# Patient Record
Sex: Female | Born: 1970 | Race: White | Hispanic: No | Smoking: Never smoker
Health system: Southern US, Community
[De-identification: ages and names within clinical notes are randomized; demographics above are authoritative.]

## PROBLEM LIST (undated history)

## (undated) DIAGNOSIS — G43909 Migraine, unspecified, not intractable, without status migrainosus: Secondary | ICD-10-CM

## (undated) DIAGNOSIS — L309 Dermatitis, unspecified: Secondary | ICD-10-CM

## (undated) DIAGNOSIS — T148XXA Other injury of unspecified body region, initial encounter: Secondary | ICD-10-CM

## (undated) DIAGNOSIS — F32A Depression, unspecified: Secondary | ICD-10-CM

## (undated) DIAGNOSIS — R112 Nausea with vomiting, unspecified: Secondary | ICD-10-CM

## (undated) DIAGNOSIS — F419 Anxiety disorder, unspecified: Secondary | ICD-10-CM

## (undated) DIAGNOSIS — L509 Urticaria, unspecified: Secondary | ICD-10-CM

## (undated) DIAGNOSIS — E039 Hypothyroidism, unspecified: Secondary | ICD-10-CM

## (undated) DIAGNOSIS — T7840XA Allergy, unspecified, initial encounter: Secondary | ICD-10-CM

## (undated) DIAGNOSIS — Z9889 Other specified postprocedural states: Secondary | ICD-10-CM

## (undated) DIAGNOSIS — F329 Major depressive disorder, single episode, unspecified: Secondary | ICD-10-CM

## (undated) DIAGNOSIS — T8859XA Other complications of anesthesia, initial encounter: Secondary | ICD-10-CM

## (undated) DIAGNOSIS — M6530 Trigger finger, unspecified finger: Secondary | ICD-10-CM

## (undated) DIAGNOSIS — N3010 Interstitial cystitis (chronic) without hematuria: Secondary | ICD-10-CM

## (undated) DIAGNOSIS — G709 Myoneural disorder, unspecified: Secondary | ICD-10-CM

## (undated) DIAGNOSIS — M7710 Lateral epicondylitis, unspecified elbow: Secondary | ICD-10-CM

## (undated) DIAGNOSIS — E785 Hyperlipidemia, unspecified: Secondary | ICD-10-CM

## (undated) DIAGNOSIS — N920 Excessive and frequent menstruation with regular cycle: Secondary | ICD-10-CM

## (undated) DIAGNOSIS — K219 Gastro-esophageal reflux disease without esophagitis: Secondary | ICD-10-CM

## (undated) DIAGNOSIS — E079 Disorder of thyroid, unspecified: Secondary | ICD-10-CM

## (undated) HISTORY — DX: Myoneural disorder, unspecified: G70.9

## (undated) HISTORY — PX: DIAGNOSTIC LAPAROSCOPY: SUR761

## (undated) HISTORY — DX: Major depressive disorder, single episode, unspecified: F32.9

## (undated) HISTORY — DX: Lateral epicondylitis, unspecified elbow: M77.10

## (undated) HISTORY — PX: TRIGGER FINGER RELEASE: SHX641

## (undated) HISTORY — DX: Other injury of unspecified body region, initial encounter: T14.8XXA

## (undated) HISTORY — DX: Hyperlipidemia, unspecified: E78.5

## (undated) HISTORY — DX: Disorder of thyroid, unspecified: E07.9

## (undated) HISTORY — DX: Dermatitis, unspecified: L30.9

## (undated) HISTORY — DX: Anxiety disorder, unspecified: F41.9

## (undated) HISTORY — DX: Allergy, unspecified, initial encounter: T78.40XA

## (undated) HISTORY — PX: ELBOW SURGERY: SHX618

## (undated) HISTORY — DX: Depression, unspecified: F32.A

## (undated) HISTORY — DX: Urticaria, unspecified: L50.9

---

## 1998-05-18 ENCOUNTER — Ambulatory Visit (HOSPITAL_BASED_OUTPATIENT_CLINIC_OR_DEPARTMENT_OTHER): Admission: RE | Admit: 1998-05-18 | Discharge: 1998-05-18 | Payer: Self-pay | Admitting: Plastic Surgery

## 1998-07-23 ENCOUNTER — Other Ambulatory Visit: Admission: RE | Admit: 1998-07-23 | Discharge: 1998-07-23 | Payer: Self-pay | Admitting: Obstetrics & Gynecology

## 1999-02-25 ENCOUNTER — Inpatient Hospital Stay (HOSPITAL_COMMUNITY): Admission: AD | Admit: 1999-02-25 | Discharge: 1999-02-25 | Payer: Self-pay | Admitting: Obstetrics and Gynecology

## 1999-03-08 ENCOUNTER — Inpatient Hospital Stay (HOSPITAL_COMMUNITY): Admission: AD | Admit: 1999-03-08 | Discharge: 1999-03-11 | Payer: Self-pay | Admitting: Obstetrics and Gynecology

## 1999-04-06 ENCOUNTER — Other Ambulatory Visit: Admission: RE | Admit: 1999-04-06 | Discharge: 1999-04-06 | Payer: Self-pay | Admitting: Obstetrics and Gynecology

## 2000-05-15 ENCOUNTER — Other Ambulatory Visit: Admission: RE | Admit: 2000-05-15 | Discharge: 2000-05-15 | Payer: Self-pay | Admitting: Obstetrics and Gynecology

## 2001-02-07 HISTORY — PX: CHOLECYSTECTOMY: SHX55

## 2001-05-16 ENCOUNTER — Other Ambulatory Visit: Admission: RE | Admit: 2001-05-16 | Discharge: 2001-05-16 | Payer: Self-pay | Admitting: Obstetrics and Gynecology

## 2001-05-17 ENCOUNTER — Encounter: Payer: Self-pay | Admitting: Internal Medicine

## 2001-05-17 ENCOUNTER — Encounter: Admission: RE | Admit: 2001-05-17 | Discharge: 2001-05-17 | Payer: Self-pay | Admitting: Internal Medicine

## 2001-06-19 ENCOUNTER — Encounter: Admission: RE | Admit: 2001-06-19 | Discharge: 2001-06-19 | Payer: Self-pay | Admitting: Internal Medicine

## 2001-06-19 ENCOUNTER — Encounter: Payer: Self-pay | Admitting: Internal Medicine

## 2001-07-09 ENCOUNTER — Encounter (INDEPENDENT_AMBULATORY_CARE_PROVIDER_SITE_OTHER): Payer: Self-pay | Admitting: *Deleted

## 2001-07-09 ENCOUNTER — Encounter: Payer: Self-pay | Admitting: General Surgery

## 2001-07-09 ENCOUNTER — Ambulatory Visit (HOSPITAL_COMMUNITY): Admission: RE | Admit: 2001-07-09 | Discharge: 2001-07-10 | Payer: Self-pay | Admitting: General Surgery

## 2002-03-30 ENCOUNTER — Inpatient Hospital Stay (HOSPITAL_COMMUNITY): Admission: AD | Admit: 2002-03-30 | Discharge: 2002-03-30 | Payer: Self-pay | Admitting: Obstetrics and Gynecology

## 2002-04-27 ENCOUNTER — Inpatient Hospital Stay (HOSPITAL_COMMUNITY): Admission: AD | Admit: 2002-04-27 | Discharge: 2002-04-27 | Payer: Self-pay | Admitting: Obstetrics and Gynecology

## 2002-05-23 ENCOUNTER — Other Ambulatory Visit: Admission: RE | Admit: 2002-05-23 | Discharge: 2002-05-23 | Payer: Self-pay | Admitting: Obstetrics and Gynecology

## 2002-07-09 ENCOUNTER — Ambulatory Visit (HOSPITAL_COMMUNITY): Admission: RE | Admit: 2002-07-09 | Discharge: 2002-07-09 | Payer: Self-pay | Admitting: Obstetrics and Gynecology

## 2003-01-17 ENCOUNTER — Other Ambulatory Visit: Admission: RE | Admit: 2003-01-17 | Discharge: 2003-01-17 | Payer: Self-pay | Admitting: Obstetrics and Gynecology

## 2003-08-08 ENCOUNTER — Inpatient Hospital Stay (HOSPITAL_COMMUNITY): Admission: AD | Admit: 2003-08-08 | Discharge: 2003-08-11 | Payer: Self-pay | Admitting: Obstetrics and Gynecology

## 2003-08-12 ENCOUNTER — Encounter: Admission: RE | Admit: 2003-08-12 | Discharge: 2003-09-11 | Payer: Self-pay | Admitting: Obstetrics and Gynecology

## 2008-02-08 HISTORY — PX: OTHER SURGICAL HISTORY: SHX169

## 2009-04-06 ENCOUNTER — Encounter: Admission: RE | Admit: 2009-04-06 | Discharge: 2009-04-06 | Payer: Self-pay | Admitting: Obstetrics and Gynecology

## 2010-03-29 ENCOUNTER — Other Ambulatory Visit: Payer: Self-pay | Admitting: Obstetrics and Gynecology

## 2010-03-29 DIAGNOSIS — Z1231 Encounter for screening mammogram for malignant neoplasm of breast: Secondary | ICD-10-CM

## 2010-04-06 ENCOUNTER — Ambulatory Visit
Admission: RE | Admit: 2010-04-06 | Discharge: 2010-04-06 | Disposition: A | Discharge status: Home / Self Care | Payer: BC Managed Care – PPO | Source: Ambulatory Visit | Attending: Obstetrics and Gynecology | Admitting: Obstetrics and Gynecology

## 2010-04-06 ENCOUNTER — Other Ambulatory Visit: Payer: Self-pay | Admitting: Obstetrics and Gynecology

## 2010-04-06 DIAGNOSIS — Z1231 Encounter for screening mammogram for malignant neoplasm of breast: Secondary | ICD-10-CM
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
## 2010-06-25 NOTE — Discharge Summary (Signed)
NAME:  [name], LEWING                 ACCOUNT NO.:  [id number]   MEDICAL RECORD NO.:  [id number]                   PATIENT TYPE:  INP   LOCATION:  9134                                 FACILITY:  [facility]   PHYSICIAN:  Lenoard Aden, M.D.             DATE OF BIRTH:  1971/01/08   DATE OF ADMISSION:  08/08/2003  DATE OF DISCHARGE:  08/11/2003                                 DISCHARGE SUMMARY   REASON FOR ADMISSION:  Repeat C section.   DISCHARGE DIAGNOSIS:  Repeat C section.   PERTINENT FINDINGS:  The patient underwent uncomplicated repeat C section on  May 15, 2003.  Postoperative course uncomplicated.  CBC within normal  limits.  Routine postoperative care given.  Discharged to home postop day  #3.   CONDITION ON DISCHARGE:  Good.   COMPLICATIONS:  None.   FOLLOWUP:  Follow up in the office in 4-6 weeks.   MEDICATIONS:  1. Tylox  2. Prenatal vitamins.                                               Lenoard Aden, M.D.    RJT/MEDQ  D:  08/24/2003  T:  08/25/2003  Job:  [id number]

## 2010-06-25 NOTE — Op Note (Signed)
[place]. [facility]  Patient:    [name], [name] Visit Number: [id number] MRN: [id number]          Service Type: DSU Location: [id number] Attending Physician:  Delsa Bern Dictated by:   Lorne Skeens. Hoxworth, M.D. Proc. Date: 03/15/01 Admit Date:  07/09/2001                             Operative Report  PREOPERATIVE DIAGNOSES: 1. Cholecystitis. 2. Cholelithiasis.  POSTOPERATIVE DIAGNOSES: 1. Cholecystitis. 2. Cholelithiasis.  PROCEDURE:  Laparoscopic cholecystectomy with intraoperative cholangiogram.  SURGEON:  Sharlet Salina T. Hoxworth, M.D.  ASSISTANT:  Anselm Pancoast. Zachery Dakins, M.D.  ANESTHESIA:  General.  BRIEF HISTORY:  The patient is a 40 year old white female who recently was found to have mildly elevated LFTs.  On questioning she has been having episodic upper abdominal pain after meals and ultrasound has revealed small gallstones.  Laparoscopic cholecystectomy with a cholangiogram has been recommended and accepted.  The nature of the procedure, is indications, the risks of bleeding, infection, bile duct injury with possible need for a further procedure, were discussed and understood.  She is now brought to the operating room for this procedure.  DESCRIPTION OF PROCEDURE:  The patient was brought in the operating room and placed in the supine position on the operating table, and general endotracheal anesthesia was induced.  She was given preoperative antibiotics.  PAS were in place.  The abdomen was sterilely prepped and draped.  Local anesthesia was used to infiltrate the trocar sites.  A 1 cm incision was made in the umbilicus and dissection carried down to the midline fascia, which was sharply incised for 1 cm and the peritoneum entered under direct vision.  Through a mattress suture of 0 Vicryl, the Hasson trocar was placed and pneumoperitoneum established.  Under direct vision a 10 mm trocar was placed in the  subxiphoid area and two 5 mm trocars in the right subcostal margin.  The gallbladder was visualized and was not acutely inflamed.  There were omental adhesions up onto the fundus that were taken down sharply and with cautery, and the infundibulum was exposed and retracted inferolaterally.  Fibrofatty tissue was stripped off the neck of the gallbladder toward the porta hepatis.  The distal gallbladder was thoroughly dissected as was Calots triangle.  The cystic artery was identified coursing up on the gallbladder wall.  The cystic duct was identified and dissected 360 degrees at the gallbladder junction.  The anatomy was cleared, and the cystic duct was doubly clipped proximally, clipped distally, and the cystic duct was clipped at the gallbladder junction. Operative cholangiogram was then obtained through the cystic duct.  Initial films showed a couple of filling defects that appeared to be likely air bubbles.  The x-ray was repeated, and this showed no filling defects with normal-size common bile duct and intrahepatic ducts and free flow into the duodenum.  Following this, the cholangiocath was removed and the cystic duct was doubly clipped proximally and the cystic duct and artery divided.  The gallbladder was then dissected free from its bed using hook cautery and removed through the umbilicus.  The right upper quadrant was thoroughly irrigated and suctioned until clear and complete hemostasis assured.  The trocars were removed under direct vision and all CO2 evacuated from the peritoneal cavity.  The mattress suture was secured at the umbilicus.  Skin incisions were closed wit interrupted subcuticular 4-0 Monocryl and  Steri-Strips.  Sponge and instrument counts were correct.  A dry sterile dressing applied and the patient taken to recovery in good condition. Dictated by:   Lorne Skeens. Hoxworth, M.D. Attending Physician:  Delsa Bern DD:  07/09/01 TD:  07/10/01 Job:  [id number]

## 2010-06-25 NOTE — Op Note (Signed)
NAME:  [name], [name]                 ACCOUNT NO.:  [id number]   MEDICAL RECORD NO.:  [id number]                   PATIENT TYPE:  INP   LOCATION:  9134                                 [facility]ACILITY:  [facility]   PHYSICIAN:  Lenoard Aden, M.D.             DATE OF BIRTH:  02-04-1971   DATE OF PROCEDURE:  08/08/2003  DATE OF DISCHARGE:                                 OPERATIVE REPORT   PREOPERATIVE DIAGNOSES:  1. Thirty-nine week intrauterine pregnancy.  2. Previous cesarean section, for repeat.   POSTOPERATIVE DIAGNOSES:  1. Thirty-nine week intrauterine pregnancy.  2. Previous cesarean section, for repeat.   PROCEDURE:  Repeat low segment transverse cesarean section.   SURGEON:  Lenoard Aden, M.D.   ASSISTANT:  Chester Holstein. Earlene Plater, M.D.   ANESTHESIA:  Spinal by Pamalee Leyden.   ESTIMATED BLOOD LOSS:  800 mL.   COMPLICATIONS:  None.   DRAINS:  Foley.   Counts correct.  The patient to recovery in good condition.   FINDINGS:  A full-term living female, occiput anterior position, Apgars of 8  and 9.  Pediatrics in attendance.  Placenta posterior, delivered from a  manual intact location.  Normal tubes and ovaries noted.   BRIEF OPERATIVE NOTE:  After being apprised of the risks of anesthesia,  infection, bleeding, injury to abdominal organs and need for repair, the  patient is brought to the operating room, where she is administered a spinal  anesthetic without complications, prepped and draped in the usual sterile  fashion, and a Foley catheter placed.  After achieving adequate anesthesia,  dilute Marcaine solution placed in the area of previous Pfannenstiel skin  incision, made with a scalpel, carried down to the fascia, which is nicked  in the midline and opened transversely using Mayo scissors.  Rectus muscles  dissected sharply in the midline, peritoneum entered sharply, bladder blade  placed.  The visceral peritoneum scored sharply off the lower uterine  segment using  Metzenbaum scissors.  Kerr hysterotomy incision made.  Amniotomy, clear fluid.  Atraumatic delivery of a full-term living female,  handed to pediatricians in attendance.  Cord blood collected, bulb suction  performed, uterus exteriorized, curetted using a dry lap, then closed in two  layers using a 0 Monocryl suture.  Good hemostasis noted.  The bladder flap  is inspected and  found to be hemostatic.  The fascia closed using a 0 Monocryl suture, 2-0  plain suture placed in the subcutaneous tissue, old scar is removed.  Skin  closed using staples.  The patient tolerates the procedure well, transferred  to recovery in good condition.                                               Lenoard Aden, M.D.    RJT/MEDQ  D:  08/08/2003  T:  08/09/2003  Job:  [id number]
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
## 2010-06-25 NOTE — Op Note (Signed)
NAME:  [name], [name]                 ACCOUNT NO.:  [id number]   MEDICAL RECORD NO.:  [id number]                   PATIENT TYPE:  AMB   LOCATION:  SDC                                  [facility]ILITY:  [facility]   PHYSICIAN:  Lenoard Aden, M.D.             DATE OF BIRTH:  September 27, 1970   DATE OF PROCEDURE:  07/09/2002  DATE OF DISCHARGE:                                 OPERATIVE REPORT   PREOPERATIVE DIAGNOSIS:  Pelvic pain, dysmenorrhea.   POSTOPERATIVE DIAGNOSIS:  Pelvic adhesions and left peritubal cyst.   PROCEDURE:  Diagnostic laparoscopy, lysis of adhesions, chromopertubation.   SURGEON:  Lenoard Aden, M.D.   ANESTHESIA:  General.   ESTIMATED BLOOD LOSS:  Less than 20 cc.   COMPLICATIONS:  None.   DRAINS:  None.   COUNTS:  Correct.   The patient to recovery in good condition.   FINDINGS:  Normal retroflexed uterus, omental adhesions to anterior  abdominal wall along the area of previous Cesarean section site.  Left  normal appearing left tube with small left peritubal cyst.  Normal right  tube.  Bilateral normal ovaries.  Left corpus luteum.   DESCRIPTION OF PROCEDURE:  After being apprised of the risks of anesthesia,  infection, bleeding, inability to cure pain, the patient was brought to the  operating room where she was administered general anesthetic without  complication, prepped and draped in usual sterile fashion, catheterized  until the bladder is emptied.  After achieving adequate general anesthesia,  a single-tooth tenaculum and acorn cannula placed per vagina and set up for  chromopertubation with methylene blue dye.  At this time infraumbilical  incision made with the scalpel after placement of a dilute Marcaine  solution.  Veress needle placed, opening pressure -1 noted.  CO2, 3.5  liters, insufflated without difficulty.  Trocar placement done with  atraumatic trocar entry noted.  Visualization reveals absent gallbladder and  normal appendix,  retroflexed uterus, omental adhesions to anterior abdominal  wall, normal tubes and ovaries, normal posterior cul-de-sac.  After  visualization is complete and pictures are taken 5 mm trocar site made in  the midline of previous Cesarean section incision.  Incision then made with  a scalpel under dilute Marcaine solution and 5 mm trocar placed.  Atraumatic  grasper placed.  Anterior abdominal wall adhesions are lysed using monopolar  scissors after identifying that there is no bowel involved in the anterior  abdominal wall adhesions.  At this time good visualization is noticed.  Manipulation of the uterus, tubes and ovaries reveals normal anatomy.  A  small left peritubal cyst is noted.  Methylene blue dye is infiltrated  through the tubes and found to be free and copious spillage from bilateral  normal appearing fimbria.  At this posterior and anterior cul-de-sac are  inspected and found to be without evidence of endometriosis.  There are  however some string-like adhesions to the posterior wall of the uterus in  the cul-de-sac.  However, there is no evidence of vesicular or typical  appearing endometriosis in any of these areas.  At this time irrigation was  accomplished and CO2 released.  All instruments removed under direct  visualization.  Incisions are closed using 0 Vicryl, 4-0 Vicryl, and  Dermabond.  The patient tolerated the procedure well and is transferred to  recovery in good condition.                                               Lenoard Aden, M.D.    RJT/MEDQ  D:  07/09/2002  T:  07/09/2002  Job:  [id number]

## 2010-06-25 NOTE — Procedures (Signed)
[place]. [facility]  Patient:    [name]                 MRN: [id number] Proc. Date: 02/25/99 Adm. Date:  [id number] Attending:  Lenoard Aden CC:         [facility]                           Procedure Report  CHIEF COMPLAINT:  Breech presentation at 38-weeks for external cephalic version. The patient is a 40 year old white female, G 1, P 0, now at 38-weeks with new onset breech/transverse lie who presents for external cephalic version.  She is apprised of the risks of fetal bradycardia, possible need for emergent C-section.  She is apprised of the 50% success rate.  She is also apprised of a 10% reversion rate  after successful version.  After these precautions, she acknowledges consents were signed, and desired to proceed.  After the patient was placed on the monitor, reactive NST was noted with Dr. Cherly Hensen as my first assistant.  Three attempts ere made at external cephalic version with a forward roll under ultrasound guidance  after breech had been confirmed.  Forward roll was tried from the maternal left to right, and then after that, backwards roll was attempted from right to left. All attempts at external cephalic version maneuvers were unsuccessful.  During the procedure, fetal heart tones were noted to be in the 120-140 beats per minute range.  Subsequent to the procedure, reactive NST was noted.  The patient tolerated the procedure well and was discharged to home. DD:  02/25/99 TD:  02/25/99 Job: [id number]

## 2011-03-28 ENCOUNTER — Other Ambulatory Visit: Payer: Self-pay | Admitting: Obstetrics and Gynecology

## 2011-03-28 DIAGNOSIS — Z1231 Encounter for screening mammogram for malignant neoplasm of breast: Secondary | ICD-10-CM

## 2011-04-22 ENCOUNTER — Ambulatory Visit
Admission: RE | Admit: 2011-04-22 | Discharge: 2011-04-22 | Disposition: A | Discharge status: Home / Self Care | Payer: BC Managed Care – PPO | Source: Ambulatory Visit | Attending: Obstetrics and Gynecology | Admitting: Obstetrics and Gynecology

## 2011-04-22 DIAGNOSIS — Z1231 Encounter for screening mammogram for malignant neoplasm of breast: Secondary | ICD-10-CM

## 2012-04-10 ENCOUNTER — Other Ambulatory Visit: Payer: Self-pay

## 2012-04-10 DIAGNOSIS — Z1231 Encounter for screening mammogram for malignant neoplasm of breast: Secondary | ICD-10-CM

## 2012-05-14 ENCOUNTER — Ambulatory Visit
Admission: RE | Admit: 2012-05-14 | Discharge: 2012-05-14 | Disposition: A | Discharge status: Home / Self Care | Payer: BC Managed Care – PPO | Source: Ambulatory Visit

## 2012-05-14 DIAGNOSIS — Z1231 Encounter for screening mammogram for malignant neoplasm of breast: Secondary | ICD-10-CM

## 2013-05-13 ENCOUNTER — Other Ambulatory Visit: Payer: Self-pay

## 2013-05-13 DIAGNOSIS — Z1231 Encounter for screening mammogram for malignant neoplasm of breast: Secondary | ICD-10-CM

## 2013-05-16 ENCOUNTER — Ambulatory Visit
Admission: RE | Admit: 2013-05-16 | Discharge: 2013-05-16 | Disposition: A | Discharge status: Home / Self Care | Payer: BC Managed Care – PPO | Source: Ambulatory Visit

## 2013-05-16 DIAGNOSIS — Z1231 Encounter for screening mammogram for malignant neoplasm of breast: Secondary | ICD-10-CM

## 2015-11-17 ENCOUNTER — Ambulatory Visit (INDEPENDENT_AMBULATORY_CARE_PROVIDER_SITE_OTHER): Payer: BC Managed Care – PPO | Admitting: Family Medicine

## 2015-11-17 VITALS — BP 134/76 | HR 91 | Temp 99.0°F | Resp 18 | Ht 66.5 in | Wt 169.0 lb

## 2015-11-17 DIAGNOSIS — E039 Hypothyroidism, unspecified: Secondary | ICD-10-CM

## 2015-11-17 DIAGNOSIS — B372 Candidiasis of skin and nail: Secondary | ICD-10-CM

## 2015-11-17 DIAGNOSIS — Z23 Encounter for immunization: Secondary | ICD-10-CM

## 2015-11-17 LAB — POCT CBC
GRANULOCYTE PERCENT: 76.9 % (ref 37–80)
HEMATOCRIT: 39.6 % (ref 37.7–47.9)
HEMOGLOBIN: 14.1 g/dL (ref 12.2–16.2)
LYMPH, POC: 2.1 (ref 0.6–3.4)
MCH, POC: 31.7 pg — AB (ref 27–31.2)
MCHC: 35.6 g/dL — AB (ref 31.8–35.4)
MCV: 89.1 fL (ref 80–97)
MID (cbc): 0.4 (ref 0–0.9)
MPV: 7.4 fL (ref 0–99.8)
POC GRANULOCYTE: 8.1 — AB (ref 2–6.9)
POC LYMPH %: 19.7 % (ref 10–50)
POC MID %: 3.4 % (ref 0–12)
Platelet Count, POC: 237 10*3/uL (ref 142–424)
RBC: 4.44 M/uL (ref 4.04–5.48)
RDW, POC: 12.7 %
WBC: 10.5 10*3/uL — AB (ref 4.6–10.2)

## 2015-11-17 LAB — GLUCOSE, POCT (MANUAL RESULT ENTRY): POC Glucose: 64 mg/dl — AB (ref 70–99)

## 2015-11-17 LAB — COMPREHENSIVE METABOLIC PANEL
ALK PHOS: 87 U/L (ref 33–115)
ALT: 32 U/L — AB (ref 6–29)
AST: 22 U/L (ref 10–35)
Albumin: 4.4 g/dL (ref 3.6–5.1)
BILIRUBIN TOTAL: 0.4 mg/dL (ref 0.2–1.2)
BUN: 14 mg/dL (ref 7–25)
CO2: 21 mmol/L (ref 20–31)
Calcium: 9.5 mg/dL (ref 8.6–10.2)
Chloride: 105 mmol/L (ref 98–110)
Creat: 0.75 mg/dL (ref 0.50–1.10)
GLUCOSE: 78 mg/dL (ref 65–99)
Potassium: 3.6 mmol/L (ref 3.5–5.3)
Sodium: 137 mmol/L (ref 135–146)
TOTAL PROTEIN: 7.1 g/dL (ref 6.1–8.1)

## 2015-11-17 LAB — POCT SKIN KOH: Skin KOH, POC: NEGATIVE

## 2015-11-17 LAB — POCT GLYCOSYLATED HEMOGLOBIN (HGB A1C): Hemoglobin A1C: 5.3

## 2015-11-17 MED ORDER — ITRACONAZOLE 100 MG PO CAPS: 200.0000 mg | ORAL_CAPSULE | Freq: Two times a day (BID) | ORAL | 1 refills | Status: DC

## 2015-11-17 MED ORDER — LORAZEPAM 0.5 MG PO TABS: 0.5000 mg | ORAL_TABLET | Freq: Every evening | ORAL | 0 refills | Status: DC | PRN

## 2015-11-17 MED ORDER — HYDROXYZINE HCL 25 MG PO TABS: 25.0000 mg | ORAL_TABLET | Freq: Four times a day (QID) | ORAL | 0 refills | Status: DC | PRN

## 2015-11-17 MED ORDER — NYSTATIN 100000 UNIT/GM EX POWD: Freq: Four times a day (QID) | CUTANEOUS | 2 refills | Status: DC

## 2015-11-17 MED ORDER — KETOCONAZOLE 2 % EX CREA: 1.0000 "application " | TOPICAL_CREAM | Freq: Every day | CUTANEOUS | 2 refills | Status: DC

## 2015-11-17 NOTE — Progress Notes (Signed)
Subjective:  By signing my name below, I, [name], attest that this documentation has been prepared under the direction and in the presence of Andrea Cheadle, MD. Electronically Signed: Moises May, [place]. 11/17/2015 , 3:58 PM .  Patient was seen in Room 11 .   Patient ID: [name], female    DOB: 03/06/1970, 45 y.o.   MRN: [id number] Chief Complaint  Patient presents with  . Rash    x 3 months - noticed first one on Lt inner tigh, and spread. Thinks is a ringworm.   HPI [name] is a 45 y.o. female who presents to [facility] complaining of a rash that began in her left inner thigh as a small circle the size of a dime almost 4 mos prior.  She had often suffered from vaginal and perineal candidiasis prior and so started using nystatin-triamcinolone she had been prescribed prior which seemed to help but then returned and progressed to the size of baseball followed by the development of central clearing.  She tried hydrocortisone cream which also seemed to help initially but then again worsened and she has not developed a similar rash under both her breasts and her axilla.  She is trying to keep very dry with cornstarch and cleaning with diluted teat-tree oil and then mixed with coconut oil as well as lotrimin bid > 1 wk Is really miserable as gets much more pruritic and burning when she sweats. Very irritated. Was itching so much she had to take benadryl sev times/d for the last few d.  Reports she has often had vaginal candidiasis numerous times prior and often is difficult to get it to resolve. She has to take fluconazole qd3 x 3 and often use otc monistat in addition - last course of this was just 2 wks ago.  Unknown why she gets this so freq.  Does not have a PCP and would like to establish here.  She has been primarily following with just her gyn - Dr. [name].   He has been following her thyroid as well but would like to change that to here. He has tried her on klonopin which she  uses periodically to help sleep but tends to make her more groggy in the morning which is difficult considering her high intensity/energy job.    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 Drinks 2 pints of beer 4 nights/wk.  No past medical history on file. Prior to Admission medications   Not on File   Allergies  Allergen Reactions  . Sulfa Antibiotics Shortness Of Breath  . Vibramycin [Doxycycline Calcium]    Past Surgical History:  Procedure Laterality Date  . CESAREAN SECTION    . CHOLECYSTECTOMY  2003   Family History  Problem Relation Age of Onset  . Hypertension Father   . Hyperlipidemia Father   . Cancer Maternal Grandmother   . Diabetes Maternal Grandmother   . Hypertension Maternal Grandmother   . Heart disease Maternal Grandfather   . Hyperlipidemia Maternal Grandfather   . Hypertension Maternal Grandfather   . Diabetes Paternal Grandmother   . Stroke Paternal Grandmother   . Diabetes Paternal Grandfather   . Heart disease Paternal Grandfather   . Hyperlipidemia Paternal Grandfather   . Hypertension Paternal Grandfather    Social History   Social History  . Marital status: Married    Spouse name: N/A  . Number of children: N/A  . Years of  education: N/A   Social History Main Topics  . Smoking status: Never Smoker  . Smokeless tobacco: Never Used  . Alcohol use None  . Drug use: Unknown  . Sexual activity: Not Asked   Other Topics Concern  . None   Social History Narrative  . None    Review of Systems  Constitutional: Negative for activity change, appetite change, chills, diaphoresis, fever and unexpected weight change.  Gastrointestinal: Negative for abdominal distention, abdominal pain, nausea and vomiting.  Genitourinary: Positive for genital sores. Negative for dyspareunia, dysuria, pelvic pain and vaginal discharge.  Musculoskeletal: Negative for gait problem and joint swelling.  Skin:  Positive for rash. Negative for pallor and wound.  Allergic/Immunologic: Negative for environmental allergies, food allergies and immunocompromised state.  Hematological: Negative for adenopathy. Does not bruise/bleed easily.  Psychiatric/Behavioral: Positive for sleep disturbance.       Objective:   Physical Exam  Constitutional: She is oriented to person, place, and time. She appears well-developed and well-nourished. No distress.  HENT:  Head: Normocephalic and atraumatic.  Eyes: EOM are normal. Pupils are equal, round, and reactive to light.  Neck: Neck supple.  Cardiovascular: Normal rate.   Pulmonary/Chest: Effort normal. No respiratory distress.  Musculoskeletal: Normal range of motion.  Lymphadenopathy:    She has no axillary adenopathy.       Right: No inguinal adenopathy present.       Left: No inguinal adenopathy present.  Neurological: She is alert and oriented to person, place, and time.  Skin: Skin is warm and dry. Rash noted. Rash is maculopapular. There is erythema.  In crease between labia majora and and upper thigh, bilateral axilla, and under breasts pt has macular light pink erythema with few surrounding satellite lesions.  Psychiatric: She has a normal mood and affect. Her behavior is normal.  Nursing note and vitals reviewed.   BP 134/76 (BP Location: Right Arm, Patient Position: Sitting, Cuff Size: Normal)   Pulse 91   Temp 99 F (37.2 C) (Oral)   Resp 18   Ht 5' 6.5" (1.689 m)   Wt 169 lb (76.7 kg)   SpO2 97%   BMI 26.87 kg/m     Results for orders placed or performed in visit on 11/17/15  Thyroid Panel With TSH  Result Value Ref Range   T4, Total  4.5 - 12.0 ug/dL   T3 Uptake  22 - 35 %   Free Thyroxine Index  1.4 - 3.8   TSH 1.00 mIU/L  Comprehensive metabolic panel  Result Value Ref Range   Sodium  135 - 146 mmol/L   Potassium  3.5 - 5.3 mmol/L   Chloride  98 - 110 mmol/L   CO2  20 - 31 mmol/L   Glucose, Bld  65 - 99 mg/dL   BUN  7 - 25  mg/dL   Creat  0.50 - 1.10 mg/dL   Total Bilirubin  0.2 - 1.2 mg/dL   Alkaline Phosphatase  33 - 115 U/L   AST  10 - 35 U/L   ALT  6 - 29 U/L   Total Protein  6.1 - 8.1 g/dL   Albumin  3.6 - 5.1 g/dL   Calcium  8.6 - 10.2 mg/dL  POCT glycosylated hemoglobin (Hb A1C)  Result Value Ref Range   Hemoglobin A1C 5.3   POCT glucose (manual entry)  Result Value Ref Range   POC Glucose 64 (A) 70 - 99 mg/dl  POCT CBC  Result Value Ref Range  WBC 10.5 (A) 4.6 - 10.2 K/uL   Lymph, poc 2.1 0.6 - 3.4   POC LYMPH PERCENT 19.7 10 - 50 %L   MID (cbc) 0.4 0 - 0.9   POC MID % 3.4 0 - 12 %M   POC Granulocyte 8.1 (A) 2 - 6.9   Granulocyte percent 76.9 37 - 80 %G   RBC 4.44 4.04 - 5.48 M/uL   Hemoglobin 14.1 12.2 - 16.2 g/dL   HCT, POC 39.6 37.7 - 47.9 %   MCV 89.1 80 - 97 fL   MCH, POC 31.7 (A) 27 - 31.2 pg   MCHC 35.6 (A) 31.8 - 35.4 g/dL   RDW, POC 12.7 %   Platelet Count, POC 237 142 - 424 K/uL   MPV 7.4 0 - 99.8 fL  POCT Skin KOH  Result Value Ref Range   Skin KOH, POC Negative   did NOT obtain a good scraping - mainly just topical ointment  Assessment & Plan:   1. Candidiasis of skin - has failed topical treatment with nystatin-triamcinolone, hydrocortisone, lotrimen, and oral fluconazole x 3 doses in 1 wk. Pt concerned she may be developing some resistance the the fluconazole due to her freq of use as she is often having difficulty still getting rid for monilial flairs so will try itraconazole instead - 200 mg bid x 2-6 wks - cont until fully resolved.  Also use topicals and try to keep dry.  As pt does drink beer sev x/wk and needs to cut down while on oral antifungal, she would like a sleep med to help her wind down at night - try lorazepam rather than klonopin since tht is last to long.  RTC for recheck and lfts if any abd pain, nausea. Try hydroxyzine prn during day.  2. Hypothyroidism, unspecified type - refill levothyroxine x 6 mos prn over next 6 mos  3. Encounter for immunization      Orders Placed This Encounter  Procedures  . Flu Vaccine QUAD 36+ mos IM  . Thyroid Panel With TSH  . Comprehensive metabolic panel  . POCT glycosylated hemoglobin (Hb A1C)  . POCT glucose (manual entry)  . POCT CBC  . POCT Skin KOH    Meds ordered this encounter  Medications  . levothyroxine (SYNTHROID, LEVOTHROID) 100 MCG tablet    Sig: Take 100 mcg by mouth daily before breakfast.  . Meth-Hyo-M Bl-Na Phos-Ph Sal (URIBEL) 118 MG CAPS    Sig: Take by mouth.  . itraconazole (SPORANOX) 100 MG capsule    Sig: Take 2 capsules (200 mg total) by mouth 2 (two) times daily.    Dispense:  120 capsule    Refill:  1  . LORazepam (ATIVAN) 0.5 MG tablet    Sig: Take 1-2 tablets (0.5-1 mg total) by mouth at bedtime as needed for anxiety.    Dispense:  60 tablet    Refill:  0  . hydrOXYzine (ATARAX/VISTARIL) 25 MG tablet    Sig: Take 1 tablet (25 mg total) by mouth every 6 (six) hours as needed for anxiety or itching.    Dispense:  60 tablet    Refill:  0  . nystatin (MYCOSTATIN/NYSTOP) powder    Sig: Apply topically 4 (four) times daily.    Dispense:  60 g    Refill:  2  . ketoconazole (NIZORAL) 2 % cream    Sig: Apply 1 application topically daily.    Dispense:  60 g    Refill:  2  [name], M.D.  [facility] [address] [place], [place] [address] [phone] phone [phone] fax  11/17/15 10:48 PM

## 2015-11-17 NOTE — Patient Instructions (Addendum)
     IF you received an x-ray today, you will receive an invoice from [facility]. Please contact [facility] at [phone] with questions or concerns regarding your invoice.   IF you received labwork today, you will receive an invoice from [employer]. Please contact [facility] at [phone] with questions or concerns regarding your invoice.   Our billing staff will not be able to assist you with questions regarding bills from these companies.  You will be contacted with the lab results as soon as they are available. The fastest way to get your results is to activate your My Chart account. Instructions are located on the last page of this paperwork. If you have not heard from [place] regarding the results in 2 weeks, please contact this office.     Cutaneous Candidiasis Cutaneous candidiasis is a condition in which there is an overgrowth of yeast (candida) on the skin. Yeast normally live on the skin, but in small enough numbers not to cause any symptoms. In certain cases, increased growth of the yeast may cause an actual yeast infection. This kind of infection usually occurs in areas of the skin that are constantly warm and moist, such as the armpits or the groin. Yeast is the most common cause of diaper rash in babies and in people who cannot control their bowel movements (incontinence). CAUSES  The fungus that most often causes cutaneous candidiasis is Candida albicans. Conditions that can increase the risk of getting a yeast infection of the skin include:  Obesity.  Pregnancy.  Diabetes.  Taking antibiotic medicine.  Taking birth control pills.  Taking steroid medicines.  Thyroid disease.  An iron or zinc deficiency.  Problems with the immune system. SYMPTOMS   Red, swollen area of the skin.  Bumps on the skin.  Itchiness. DIAGNOSIS  The diagnosis of cutaneous candidiasis is usually based on its appearance. Light scrapings of  the skin may also be taken and viewed under a microscope to identify the presence of yeast. TREATMENT  Antifungal creams may be applied to the infected skin. In severe cases, oral medicines may be needed.  HOME CARE INSTRUCTIONS   Keep your skin clean and dry.  Maintain a healthy weight.  If you have diabetes, keep your blood sugar under control. SEEK IMMEDIATE MEDICAL CARE IF:  Your rash continues to spread despite treatment.  You have a fever, chills, or abdominal pain.   This information is not intended to replace advice given to you by your health care provider. Make sure you discuss any questions you have with your health care provider.   Document Released: 10/12/2010 Document Revised: 04/18/2011 Document Reviewed: 07/28/2014 Elsevier Interactive Patient Education [employer].

## 2015-11-18 LAB — THYROID PANEL WITH TSH
FREE THYROXINE INDEX: 2.9 (ref 1.4–3.8)
T3 Uptake: 31 % (ref 22–35)
T4, Total: 9.4 ug/dL (ref 4.5–12.0)
TSH: 1 m[IU]/L

## 2015-11-25 ENCOUNTER — Encounter: Payer: Self-pay | Admitting: Family Medicine

## 2015-11-25 MED ORDER — LEVOTHYROXINE SODIUM 100 MCG PO TABS: 100.0000 ug | ORAL_TABLET | Freq: Every day | ORAL | 3 refills | Status: DC

## 2015-11-25 NOTE — Addendum Note (Signed)
Addended by: [name] on: 11/25/2015 03:15 PM   Modules accepted: Orders

## 2015-12-10 ENCOUNTER — Ambulatory Visit (INDEPENDENT_AMBULATORY_CARE_PROVIDER_SITE_OTHER): Payer: BC Managed Care – PPO | Admitting: Physician Assistant

## 2015-12-10 ENCOUNTER — Ambulatory Visit (INDEPENDENT_AMBULATORY_CARE_PROVIDER_SITE_OTHER): Payer: BC Managed Care – PPO

## 2015-12-10 VITALS — BP 118/76 | HR 92 | Temp 98.7°F | Resp 16 | Ht 67.0 in | Wt 169.0 lb

## 2015-12-10 DIAGNOSIS — M79672 Pain in left foot: Secondary | ICD-10-CM

## 2015-12-10 DIAGNOSIS — S8990XA Unspecified injury of unspecified lower leg, initial encounter: Secondary | ICD-10-CM

## 2015-12-10 DIAGNOSIS — S8011XA Contusion of right lower leg, initial encounter: Secondary | ICD-10-CM | POA: Diagnosis not present

## 2015-12-10 DIAGNOSIS — T148XXA Other injury of unspecified body region, initial encounter: Secondary | ICD-10-CM | POA: Diagnosis not present

## 2015-12-10 MED ORDER — MUPIROCIN 2 % EX OINT: 1.0000 "application " | TOPICAL_OINTMENT | Freq: Two times a day (BID) | CUTANEOUS | 1 refills | Status: DC

## 2015-12-10 NOTE — Progress Notes (Signed)
[facility] [address] [place] [address] [phone]  Date:  12/10/2015   Name:  [name]   DOB:  September 17, 1970   MRN:  [id number]  PCP:  [name], MD    History of Present Illness:  [name] is a 45 y.o. female patient who presents to [facility] for chief complaint of right leg pain, and left foot pain. Right leg pain started 4 days ago. She was walking when she stepped into a hole that was about 1-2 feet deep. There was a metal pole in there. She hit her shin along the metal pole. It was excruciatingly painful. She had an operation which she treated with Neosporin. It was difficult to walk due to the pain but it has improved a lot over the last few days and pain. Husband had placed clear adhesive over the abrasion on Monday, 4 days ago. 12 hours later she complained of it continuously stinging. He removed the adhesive to find blisters along the sides of her leg where the adhesive bandaging was. These have not gone away. The leg continues to standing. She is able to ambulate well. She is a our Pharmacist, hospital. 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 Foot pain started more than a week ago. She does not know the cause of injury. There was no trauma however she does wonder if she may have hit her foot on a chair at one point. The pain is at her medial side of the top of her foot. She has noticed that it's mildly swollen and red. She generally wears dance code issues and says that they're difficult to put on at this time secondary to the pain. She is able to ambulate fine on it as well.  She has had a history of rash following any kind of adhesive bandaging or Band-Aid.   There are no active problems to display for this patient.   Past Medical History:  Diagnosis Date  . Allergy   . Anxiety   . Depression   . Thyroid disease     Past Surgical History:  Procedure Laterality Date  . CESAREAN SECTION    . CHOLECYSTECTOMY  2003    Social History  Substance Use  Topics  . Smoking status: Never Smoker  . Smokeless tobacco: Never Used  . Alcohol use No    Family History  Problem Relation Age of Onset  . Hypertension Father   . Hyperlipidemia Father   . Cancer Maternal Grandmother   . Diabetes Maternal Grandmother   . Hypertension Maternal Grandmother   . Heart disease Maternal Grandfather   . Hyperlipidemia Maternal Grandfather   . Hypertension Maternal Grandfather   . Diabetes Paternal Grandmother   . Stroke Paternal Grandmother   . Diabetes Paternal Grandfather   . Heart disease Paternal Grandfather   . Hyperlipidemia Paternal Grandfather   . Hypertension Paternal Grandfather     Allergies  Allergen Reactions  . Sulfa Antibiotics Shortness Of Breath  . Vibramycin [Doxycycline Calcium]     Medication list has been reviewed and updated.  Current Outpatient Prescriptions on File Prior to Visit  Medication Sig Dispense Refill  . levothyroxine (SYNTHROID, LEVOTHROID) 100 MCG tablet Take 1 tablet (100 mcg total) by mouth daily. 90 tablet 3  . LORazepam (ATIVAN) 0.5 MG tablet Take 1-2 tablets (0.5-1 mg total) by mouth at bedtime as needed for anxiety. 60 tablet 0  . Meth-Hyo-M Bl-Na Phos-Ph Sal (URIBEL) 118 MG CAPS Take by  mouth.    . nystatin (MYCOSTATIN/NYSTOP) powder Apply topically 4 (four) times daily. 60 g 2   No current facility-administered medications on file prior to visit.     ROS ROS otherwise unremarkable unless listed above.  Physical Examination: BP 118/76 (BP Location: Right Arm, Patient Position: Sitting, Cuff Size: Normal)   Pulse 92   Temp 98.7 F (37.1 C) (Oral)   Resp 16   Ht 5\' 7"  (1.702 m)   Wt 169 lb (76.7 kg)   SpO2 98%   BMI 26.47 kg/m  Ideal Body Weight: Weight in (lb) to have BMI = 25: 159.3  Physical Exam  Constitutional: She is oriented to person, place, and time. She appears well-developed and well-nourished. No distress.  HENT:  Head: Normocephalic and atraumatic.  Right Ear: External ear  normal.  Left Ear: External ear normal.  Eyes: Conjunctivae and EOM are normal. Pupils are equal, round, and reactive to light.  Cardiovascular: Normal rate.   Pulmonary/Chest: Effort normal. No respiratory distress.  Neurological: She is alert and oriented to person, place, and time.  Skin: She is not diaphoretic.  Right leg with anterior abrasion about 3 cm supero-inferior.  There are 3 large bolus blisters without surrounding erythema that are nontender at the lateral anterior medial anterior and a third one just below or at the medial anterior. These are consistent with where the bandaging was placed.  Psychiatric: She has a normal mood and affect. Her behavior is normal.   Dg Tibia/fibula Right  Result Date: 12/10/2015 CLINICAL DATA:  [place] and injured leg.  Contusions and abrasions. EXAM: RIGHT TIBIA AND FIBULA - 2 VIEW COMPARISON:  None. FINDINGS: The knee and ankle joints are maintained. No acute fracture of the tibia or fibula is identified. Calcaneal heel spur is noted incidentally along with os trigonum. Soft tissue abnormalities likely related to contusions or lacerations. IMPRESSION: Normal radiographs of the right tibia/ fibula. Electronically Signed   By: Marijo Sanes M.D.   On: 12/10/2015 16:58   Dg Foot Complete Left  Result Date: 12/10/2015 CLINICAL DATA:  Pain following injury 1 week prior EXAM: LEFT FOOT - COMPLETE 3+ VIEW COMPARISON:  None. FINDINGS: Frontal, oblique, and lateral views were obtained. There is no fracture or dislocation. Joint spaces appear normal. No erosive change. There are spurs arising from the posterior and inferior calcaneus. There is a rather prominent os naviculare, an anatomic variant. IMPRESSION: Calcaneal spurs. No fracture or dislocation. No evident arthropathy. Electronically Signed   By: Lowella Grip III M.D.   On: 12/10/2015 17:00     Assessment and Plan: [name] is a 45 y.o. female who is here today for chief complaint of  blisters, abrasion, of right leg, and left foot pain. Right leg normal without acute fracture or abnormality. Advised to use the mupirocin ointment along the abrasion. This was wrapped with a first aid bandage without adhesive. Advised ice the leg.  The blisters will remain there and advised her to use compression over the blisters that will likely reabsorb in the body or burst. If they do pursed have advised that she can apply a little bit of the mupirocin ointment. I've also advised that she let the wound care out during the nighttime last the skin become macerated. Left foot pain secondary to possible contusion or strain. I've advised that she to ice this area.  Injury of lower leg, unspecified laterality, initial encounter - Plan: DG Foot Complete Left, DG Tibia/Fibula Right  Contusion of right  lower extremity, initial encounter  Left foot pain - Plan: DG Foot Complete Left  Blister  Ivar Drape, PA-C [facility] 11/3/20178:58 AM

## 2015-12-10 NOTE — Patient Instructions (Addendum)
  I would like you to keep the mupirocin ointment along the leg.  You can keep the blisters without covering.  However if they open, place mupirocin ointment and cover by day and let breathe by night.  Wash the wound twice per day with soap and water.  These blisters should get smaller within 1 week.     IF you received an x-ray today, you will receive an invoice from [facility]. Please contact [facility] at [phone] with questions or concerns regarding your invoice.   IF you received labwork today, you will receive an invoice from [employer]. Please contact [facility] at [phone] with questions or concerns regarding your invoice.   Our billing staff will not be able to assist you with questions regarding bills from these companies.  You will be contacted with the lab results as soon as they are available. The fastest way to get your results is to activate your My Chart account. Instructions are located on the last page of this paperwork. If you have not heard from [place] regarding the results in 2 weeks, please contact this office.

## 2016-04-19 ENCOUNTER — Other Ambulatory Visit: Payer: Self-pay | Admitting: Obstetrics and Gynecology

## 2016-04-19 DIAGNOSIS — N6311 Unspecified lump in the right breast, upper outer quadrant: Secondary | ICD-10-CM

## 2016-04-19 DIAGNOSIS — N644 Mastodynia: Secondary | ICD-10-CM

## 2016-04-21 ENCOUNTER — Ambulatory Visit
Admission: RE | Admit: 2016-04-21 | Discharge: 2016-04-21 | Disposition: A | Discharge status: Home / Self Care | Payer: BC Managed Care – PPO | Source: Ambulatory Visit | Attending: Obstetrics and Gynecology | Admitting: Obstetrics and Gynecology

## 2016-04-21 ENCOUNTER — Other Ambulatory Visit: Payer: Self-pay | Admitting: Obstetrics and Gynecology

## 2016-04-21 DIAGNOSIS — N644 Mastodynia: Secondary | ICD-10-CM

## 2016-04-21 DIAGNOSIS — N6311 Unspecified lump in the right breast, upper outer quadrant: Secondary | ICD-10-CM

## 2016-08-22 ENCOUNTER — Ambulatory Visit (INDEPENDENT_AMBULATORY_CARE_PROVIDER_SITE_OTHER): Payer: BC Managed Care – PPO | Admitting: Physician Assistant

## 2016-08-22 ENCOUNTER — Telehealth: Payer: Self-pay | Admitting: Physician Assistant

## 2016-08-22 VITALS — BP 122/75 | HR 77 | Temp 98.6°F | Resp 18 | Ht 67.0 in | Wt 164.4 lb

## 2016-08-22 DIAGNOSIS — L299 Pruritus, unspecified: Secondary | ICD-10-CM | POA: Diagnosis not present

## 2016-08-22 DIAGNOSIS — L03116 Cellulitis of left lower limb: Secondary | ICD-10-CM | POA: Diagnosis not present

## 2016-08-22 DIAGNOSIS — T63424A Toxic effect of venom of ants, undetermined, initial encounter: Secondary | ICD-10-CM | POA: Diagnosis not present

## 2016-08-22 MED ORDER — DOXEPIN HCL 5 % EX CREA: 1.0000 "application " | TOPICAL_CREAM | Freq: Four times a day (QID) | CUTANEOUS | 0 refills | Status: DC

## 2016-08-22 MED ORDER — CLINDAMYCIN HCL 300 MG PO CAPS: 300.0000 mg | ORAL_CAPSULE | Freq: Four times a day (QID) | ORAL | 0 refills | Status: AC

## 2016-08-22 MED ORDER — PREDNISONE 20 MG PO TABS: ORAL_TABLET | ORAL | 0 refills | Status: DC

## 2016-08-22 NOTE — Telephone Encounter (Signed)
Please advise 

## 2016-08-22 NOTE — Progress Notes (Signed)
[name]  MRN: [id number] DOB: 1970/04/01  PCP: Shawnee Knapp, MD  Subjective:  Pt is a 46 year old female who presents to clinic for left foot pain x 3 days. Pain is 6/10 scale. C/o red, painful bite marks. She initially thought it was a bee sting, took benadryl. Two more spots appeared around 3 am that morning. They are red and painful. She used an ice pack yesterday and stayed off of it, C/o dizzines and ankle pain. Ice is the only relief. Ibuprofen/benadryl don't help. Her toes feel funny. Endorses red streaks on the top of her foot when she gets hot.  She saw fire ants on her property around the time she initially felt the pain.  Denies shob, wheezing, difficulty breathing, tongue swelling, abdominal pain, n/v.   Review of Systems  Constitutional: Negative for diaphoresis and fatigue.  Respiratory: Negative for shortness of breath and wheezing.   Cardiovascular: Negative for palpitations.  Gastrointestinal: Negative for abdominal pain, diarrhea, nausea and vomiting.  Musculoskeletal: Positive for arthralgias, gait problem and joint swelling.  Skin: Positive for wound. Negative for rash.  Neurological: Positive for dizziness. Negative for weakness.    There are no active problems to display for this patient.   Current Outpatient Prescriptions on File Prior to Visit  Medication Sig Dispense Refill  . hydrOXYzine (ATARAX/VISTARIL) 25 MG tablet Take 25 mg by mouth 3 (three) times daily as needed.    Marland Kitchen levothyroxine (SYNTHROID, LEVOTHROID) 100 MCG tablet Take 1 tablet (100 mcg total) by mouth daily. 90 tablet 3  . LORazepam (ATIVAN) 0.5 MG tablet Take 1-2 tablets (0.5-1 mg total) by mouth at bedtime as needed for anxiety. 60 tablet 0  . Meth-Hyo-M Bl-Na Phos-Ph Sal (URIBEL) 118 MG CAPS Take by mouth.    Marland Kitchen ketoconazole (NIZORAL) 2 % cream Apply 1 application topically daily.    . mupirocin ointment (BACTROBAN) 2 % Apply 1 application topically 2 (two) times daily. (Patient not  taking: Reported on 08/22/2016) 22 g 1  . nystatin (MYCOSTATIN/NYSTOP) powder Apply topically 4 (four) times daily. (Patient not taking: Reported on 08/22/2016) 60 g 2   No current facility-administered medications on file prior to visit.     Allergies  Allergen Reactions  . Sulfa Antibiotics Shortness Of Breath  . Vibramycin [Doxycycline Calcium]      Objective:  BP 122/75 (BP Location: Right Arm, Patient Position: Sitting, Cuff Size: Normal)   Pulse 77   Temp 98.6 F (37 C) (Oral)   Resp 18   Ht 5\' 7"  (1.702 m)   Wt 164 lb 6.4 oz (74.6 kg)   LMP 08/18/2016   SpO2 97%   BMI 25.75 kg/m   Physical Exam  Constitutional: She is oriented to person, place, and time and well-developed, well-nourished, and in no distress. No distress.  Cardiovascular: Normal rate, regular rhythm, normal heart sounds, intact distal pulses and normal pulses.   Musculoskeletal:  Multiple small punctate lesions dorsal foot. No streaking, warmth. Mild drainage, erythema and swelling. Wound culture taken.   Neurological: She is alert and oriented to person, place, and time. GCS score is 15.  Skin: Skin is warm and dry.  Psychiatric: Mood, memory, affect and judgment normal.  Vitals reviewed.   Assessment and Plan :  1. Cellulitis of left lower extremity 2. Fire ant bite, undetermined intent, initial encounter 3. Itching - WOUND CULTURE - predniSONE (DELTASONE) 20 MG tablet; Take 3 PO QAM x2days, 2 PO QAM x2days, 1 PO QAM x2days  Dispense: 12 tablet; Refill: 0 - clindamycin (CLEOCIN) 300 MG capsule; Take 1 capsule (300 mg total) by mouth 4 (four) times daily.  Dispense: 28 capsule; Refill: 0 - Doxepin HCl 5 % CREA; Apply 1 application topically 4 (four) times daily. at least 3- to 4-hour interval between applications; not recommended for use >8 days.  Dispense: 45 g; Refill: 0 - Suspect bacterial infection secondary to possible bite. Plan to treat. Advised ice and elevation.  RTC if no improvement in 7-10  days.    Mercer Pod, PA-C  [facility] 08/22/2016 12:16 PM

## 2016-08-22 NOTE — Patient Instructions (Addendum)
Start taking Zantac and Zyrtec together for the next 5 days. Start taking your other medications as prescribed. Con't icing as needed for pain and swelling. Elevate your foot to help with swelling. Keep the area clean (with soap and water) and dry. Come back if you are not better in 5-7 days.   Thank you for coming in today. I hope you feel we met your needs.  Feel free to call [facility] if you have any questions or further requests.  Please consider signing up for MyChart if you do not already have it, as this is a great way to communicate with me.  Best,  Whitney McVey, PA-C  IF you received an x-ray today, you will receive an invoice from [facility]. Please contact [facility] at [phone] with questions or concerns regarding your invoice.   IF you received labwork today, you will receive an invoice from [facility]. Please contact [facility] at [phone] with questions or concerns regarding your invoice.   Our billing staff will not be able to assist you with questions regarding bills from these companies.  You will be contacted with the lab results as soon as they are available. The fastest way to get your results is to activate your My Chart account. Instructions are located on the last page of this paperwork. If you have not heard from [place] regarding the results in 2 weeks, please contact this office.

## 2016-08-22 NOTE — Telephone Encounter (Signed)
GREG WHO IS THE PHARMACIST AT [facility] CALLED TO LET [place] KNOW THAT THE DOXEPIN HCI 5% CREAM WILL NEED A PRIOR AUTHORIZATION. HE FAXED OVER THE FORM TO THE [phone] FAX NUMBER. IF SHE DOES NOT WANT TO GO THAT ROUTE SHE CAN JUST CALL IN SOMETHING ELSE. BUT WITH OUT INSURANCE IT IS ABOUT $700.00. BEST PHONE [phone] [facility] [address]) [place]

## 2016-08-22 NOTE — Telephone Encounter (Signed)
Pt called checking the status of req.

## 2016-08-23 NOTE — Telephone Encounter (Signed)
I was under the impression this issue was resolved?

## 2016-08-23 NOTE — Telephone Encounter (Signed)
Whitney do you want to call something else in? Pls Advise

## 2016-08-25 LAB — WOUND CULTURE: Organism ID, Bacteria: NONE SEEN

## 2016-08-25 NOTE — Telephone Encounter (Signed)
[facility] pharmacist.   Thispatient's issue was resolve-there were no notes that this was done in the chart or in med list.  Per pharmacist - Triamcinolone Cream was called in because insurance will cover.  Pt has picked up med.

## 2016-11-10 ENCOUNTER — Encounter: Payer: Self-pay | Admitting: Family Medicine

## 2016-11-10 ENCOUNTER — Ambulatory Visit (INDEPENDENT_AMBULATORY_CARE_PROVIDER_SITE_OTHER): Payer: BC Managed Care – PPO | Admitting: Family Medicine

## 2016-11-10 ENCOUNTER — Telehealth: Payer: Self-pay | Admitting: Family Medicine

## 2016-11-10 VITALS — BP 116/62 | HR 91 | Temp 98.8°F | Resp 17 | Ht 67.0 in | Wt 164.6 lb

## 2016-11-10 DIAGNOSIS — J301 Allergic rhinitis due to pollen: Secondary | ICD-10-CM

## 2016-11-10 DIAGNOSIS — R0981 Nasal congestion: Secondary | ICD-10-CM

## 2016-11-10 DIAGNOSIS — J069 Acute upper respiratory infection, unspecified: Secondary | ICD-10-CM

## 2016-11-10 MED ORDER — FLUTICASONE PROPIONATE 50 MCG/ACT NA SUSP: 2.0000 | Freq: Every day | NASAL | 6 refills | Status: DC

## 2016-11-10 NOTE — Telephone Encounter (Signed)
Dr. Brigitte Pulse patient has scheduled an appointment

## 2016-11-10 NOTE — Patient Instructions (Addendum)
1. For sinus congestion use your flonase nasal spray 2. Rinse your nasal sinuses with Milta Deiters MD sinus rinse (there is a video on youtube to show you how) 3. Take a salt water gargle as needed (instructions below) 4. Use lozenges as needed for sore throat    IF you received an x-ray today, you will receive an invoice from [facility]. Please contact [facility] at [phone] with questions or concerns regarding your invoice.   IF you received labwork today, you will receive an invoice from [facility]. Please contact [facility] at [phone] with questions or concerns regarding your invoice.   Our billing staff will not be able to assist you with questions regarding bills from these companies.  You will be contacted with the lab results as soon as they are available. The fastest way to get your results is to activate your My Chart account. Instructions are located on the last page of this paperwork. If you have not heard from [place] regarding the results in 2 weeks, please contact this office.    Allergic Rhinitis Allergic rhinitis is when the mucous membranes in the nose respond to allergens. Allergens are particles in the air that cause your body to have an allergic reaction. This causes you to release allergic antibodies. Through a chain of events, these eventually cause you to release histamine into the blood stream. Although meant to protect the body, it is this release of histamine that causes your discomfort, such as frequent sneezing, congestion, and an itchy, runny nose. What are the causes? Seasonal allergic rhinitis (hay fever) is caused by pollen allergens that may come from grasses, trees, and weeds. Year-round allergic rhinitis (perennial allergic rhinitis) is caused by allergens such as house dust mites, pet dander, and mold spores. What are the signs or symptoms?  Nasal stuffiness (congestion).  Itchy, runny nose with sneezing and tearing of the eyes. How is this  diagnosed? Your health care provider can help you determine the allergen or allergens that trigger your symptoms. If you and your health care provider are unable to determine the allergen, skin or blood testing may be used. Your health care provider will diagnose your condition after taking your health history and performing a physical exam. Your health care provider may assess you for other related conditions, such as asthma, pink eye, or an ear infection. How is this treated? Allergic rhinitis does not have a cure, but it can be controlled by:  Medicines that block allergy symptoms. These may include allergy shots, nasal sprays, and oral antihistamines.  Avoiding the allergen.  Hay fever may often be treated with antihistamines in pill or nasal spray forms. Antihistamines block the effects of histamine. There are over-the-counter medicines that may help with nasal congestion and swelling around the eyes. Check with your health care provider before taking or giving this medicine. If avoiding the allergen or the medicine prescribed do not work, there are many new medicines your health care provider can prescribe. Stronger medicine may be used if initial measures are ineffective. Desensitizing injections can be used if medicine and avoidance does not work. Desensitization is when a patient is given ongoing shots until the body becomes less sensitive to the allergen. Make sure you follow up with your health care provider if problems continue. Follow these instructions at home: It is not possible to completely avoid allergens, but you can reduce your symptoms by taking steps to limit your exposure to them. It helps to know exactly what you are allergic to  so that you can avoid your specific triggers. Contact a health care provider if:  You have a fever.  You develop a cough that does not stop easily (persistent).  You have shortness of breath.  You start wheezing.  Symptoms interfere with normal  daily activities. This information is not intended to replace advice given to you by your health care provider. Make sure you discuss any questions you have with your health care provider. Document Released: 10/19/2000 Document Revised: 09/25/2015 Document Reviewed: 10/01/2012 Elsevier Interactive Patient Education  2017 Elsevier Inc.  Sore Throat A sore throat is pain, burning, irritation, or scratchiness in the throat. When you have a sore throat, you may feel pain or tenderness in your throat when you swallow or talk. Many things can cause a sore throat, including:  An infection.  Seasonal allergies.  Dryness in the air.  Irritants, such as smoke or pollution.  Gastroesophageal reflux disease (GERD).  A tumor.  A sore throat is often the first sign of another sickness. It may happen with other symptoms, such as coughing, sneezing, fever, and swollen neck glands. Most sore throats go away without medical treatment. Follow these instructions at home:  Take over-the-counter medicines only as told by your health care provider.  Drink enough fluids to keep your urine clear or pale yellow.  Rest as needed.  To help with pain, try: ? Sipping warm liquids, such as broth, herbal tea, or warm water. ? Eating or drinking cold or frozen liquids, such as frozen ice pops. ? Gargling with a salt-water mixture 3-4 times a day or as needed. To make a salt-water mixture, completely dissolve -1 tsp of salt in 1 cup of warm water. ? Sucking on hard candy or throat lozenges. ? Putting a cool-mist humidifier in your bedroom at night to moisten the air. ? Sitting in the bathroom with the door closed for 5-10 minutes while you run hot water in the shower.  Do not use any tobacco products, such as cigarettes, chewing tobacco, and e-cigarettes. If you need help quitting, ask your health care provider. Contact a health care provider if:  You have a fever for more than 2-3 days.  You have  symptoms that last (are persistent) for more than 2-3 days.  Your throat does not get better within 7 days.  You have a fever and your symptoms suddenly get worse. Get help right away if:  You have difficulty breathing.  You cannot swallow fluids, soft foods, or your saliva.  You have increased swelling in your throat or neck.  You have persistent nausea and vomiting. This information is not intended to replace advice given to you by your health care provider. Make sure you discuss any questions you have with your health care provider. Document Released: 03/03/2004 Document Revised: 09/20/2015 Document Reviewed: 11/14/2014 Elsevier Interactive Patient Education  [employer].

## 2016-11-10 NOTE — Progress Notes (Signed)
Chief Complaint  Patient presents with  . Sore Throat    onset: Monday, very sore, taking sudafed and aleve last taken at 12 noon  . URI    friday, fatigued, sneezing, bodyaches, chest hurts-burning, back pain  . Sinusitis    face hurts all around eyes  . Cough    no drainage, but discoloration to drainage in nose    HPI   Pt reports that she has hurting in her neck area and upper chest She states that she came in because of the chest burning with shortness of breath. She states that she does not cough but  alleve last at 12 noon for body aches.  She has been on sudafed at noon with sneezing. She has a runny nose with slight discoloration of the nasal sinuses.  She also has fatigue, with sneezing, sore throat. She reports that onset was Friday 12/04/16 with sneezing that she thought was due to allergies so she took zyrtec.   Past Medical History:  Diagnosis Date  . Allergy   . Anxiety   . Depression   . Thyroid disease     Current Outpatient Prescriptions  Medication Sig Dispense Refill  . escitalopram (LEXAPRO) 5 MG tablet Take 5 mg by mouth daily.    Marland Kitchen ketoconazole (NIZORAL) 2 % cream Apply 1 application topically daily.    Marland Kitchen levothyroxine (SYNTHROID, LEVOTHROID) 100 MCG tablet Take 1 tablet (100 mcg total) by mouth daily. 90 tablet 3  . Meth-Hyo-M Bl-Na Phos-Ph Sal (URIBEL) 118 MG CAPS Take by mouth.    . nystatin (MYCOSTATIN/NYSTOP) powder Apply topically 4 (four) times daily. 60 g 2  . Doxepin HCl 5 % CREA Apply 1 application topically 4 (four) times daily. at least 3- to 4-hour interval between applications; not recommended for use >8 days. (Patient not taking: Reported on 11/10/2016) 45 g 0  . fluticasone (FLONASE) 50 MCG/ACT nasal spray Place 2 sprays into both nostrils daily. 16 g 6  . hydrOXYzine (ATARAX/VISTARIL) 25 MG tablet Take 25 mg by mouth 3 (three) times daily as needed.    Marland Kitchen LORazepam (ATIVAN) 0.5 MG tablet Take 1-2 tablets (0.5-1 mg total) by mouth at  bedtime as needed for anxiety. 60 tablet 0  . mupirocin ointment (BACTROBAN) 2 % Apply 1 application topically 2 (two) times daily. (Patient not taking: Reported on 08/22/2016) 22 g 1   No current facility-administered medications for this visit.     Allergies:  Allergies  Allergen Reactions  . Sulfa Antibiotics Shortness Of Breath  . Vibramycin [Doxycycline Calcium]     Past Surgical History:  Procedure Laterality Date  . CESAREAN SECTION    . CHOLECYSTECTOMY  2003    Social History   Social History  . Marital status: Married    Spouse name: N/A  . Number of children: N/A  . Years of education: N/A   Social History Main Topics  . Smoking status: Never Smoker  . Smokeless tobacco: Never Used  . Alcohol use No  . Drug use: No  . Sexual activity: Not Asked   Other Topics Concern  . None   Social History Narrative  . None    ROS See hpi  Objective: Vitals:   11/10/16 1545  BP: 116/62  Pulse: 91  Resp: 17  Temp: 98.8 F (37.1 C)  TempSrc: Oral  SpO2: 95%  Weight: 164 lb 9.6 oz (74.7 kg)  Height: 5\' 7"  (1.702 m)    Physical Exam General: alert, oriented, in NAD Head:  normocephalic, atraumatic, no sinus tenderness Eyes: EOM intact, no scleral icterus or conjunctival injection Ears: TM clear bilaterally Nose: mucosa nonerythematous, nonedematous Throat: no pharyngeal exudate or erythema Lymph: no posterior auricular, submental or cervical lymph adenopathy Heart: normal rate, normal sinus rhythm, no murmurs Lungs: clear to auscultation bilaterally, no wheezing   Assessment and [place] was seen today for sore throat, uri, sinusitis and cough.  Diagnoses and all orders for this visit:  Non-seasonal allergic rhinitis due to pollen Sinus congestion Acute URI  Other orders -     fluticasone (FLONASE) 50 MCG/ACT nasal spray; Place 2 sprays into both nostrils daily.  Advised supportive care Pt to continue antihistamine No antibiotics  indicated   [place]

## 2016-11-11 MED ORDER — LORAZEPAM 0.5 MG PO TABS: 0.5000 mg | ORAL_TABLET | Freq: Every evening | ORAL | 0 refills | Status: DC | PRN

## 2016-11-11 NOTE — Telephone Encounter (Signed)
Called in refill to pharm

## 2016-11-18 ENCOUNTER — Encounter: Payer: Self-pay | Admitting: Family Medicine

## 2016-11-18 ENCOUNTER — Ambulatory Visit (INDEPENDENT_AMBULATORY_CARE_PROVIDER_SITE_OTHER): Payer: BC Managed Care – PPO | Admitting: Family Medicine

## 2016-11-18 VITALS — BP 122/72 | HR 84 | Temp 98.5°F | Resp 16 | Ht 66.93 in | Wt 162.0 lb

## 2016-11-18 DIAGNOSIS — Z23 Encounter for immunization: Secondary | ICD-10-CM

## 2016-11-18 DIAGNOSIS — R7401 Elevation of levels of liver transaminase levels: Secondary | ICD-10-CM

## 2016-11-18 DIAGNOSIS — R197 Diarrhea, unspecified: Secondary | ICD-10-CM | POA: Diagnosis not present

## 2016-11-18 DIAGNOSIS — E039 Hypothyroidism, unspecified: Secondary | ICD-10-CM | POA: Diagnosis not present

## 2016-11-18 DIAGNOSIS — L299 Pruritus, unspecified: Secondary | ICD-10-CM

## 2016-11-18 DIAGNOSIS — R740 Nonspecific elevation of levels of transaminase and lactic acid dehydrogenase [LDH]: Secondary | ICD-10-CM

## 2016-11-18 MED ORDER — LORAZEPAM 0.5 MG PO TABS: 0.5000 mg | ORAL_TABLET | Freq: Every evening | ORAL | 1 refills | Status: DC | PRN

## 2016-11-18 MED ORDER — HYDROXYZINE HCL 25 MG PO TABS: 25.0000 mg | ORAL_TABLET | Freq: Three times a day (TID) | ORAL | 2 refills | Status: DC | PRN

## 2016-11-18 MED ORDER — CLINDAMYCIN PHOSPHATE 1 % EX LOTN: TOPICAL_LOTION | Freq: Two times a day (BID) | CUTANEOUS | 5 refills | Status: DC

## 2016-11-18 MED ORDER — CETIRIZINE HCL 10 MG PO TABS: 10.0000 mg | ORAL_TABLET | Freq: Every day | ORAL | 3 refills | Status: DC

## 2016-11-18 NOTE — Progress Notes (Addendum)
Subjective:  By signing my name below, I, [name], attest that this documentation has been prepared under the direction and in the presence of [name], MD. Electronically Signed: [name], [place]. 11/18/2016 , 4:32 PM .  Patient was seen in Room 3 .   Patient ID: [name], female    DOB: 01-Mar-1970, 46 y.o.   MRN: [id number] Chief Complaint  Patient presents with  . Hypothyroidism    follow-up  . Depression  . Anxiety   HPI [name] is a 46 y.o. female who presents to Primary Care at [facility] for follow up. She mentions lots of viruses going around her work [employer]). She works as an Metallurgist.   Hypothyroidism She has had thyroid checked recently by OBGYN, Dr. [name].   Constipation She saw GI, Dr. Collene Mares, regarding constipation. She basically gave up beer, and was on a Paleo diet during the summer. She's lost weight, but unable to fully give up carbs now that school has started. She also gave up soda. Her constipation has greatly improved.   Rash She also reports history of itchy rash over her shoulder; believes if it's due to weather change. She is fine during the summer, but when it starts becoming colder, rash returns.   Yeast infection She also notes having yeast infection and vaginal candidiasis; has been using powder and cream for relief.   Immunizations She received her flu shot today.   Depression screening Depression screen [facility] 2/9 11/18/2016 11/10/2016 08/22/2016 12/10/2015 11/17/2015  Decreased Interest 0 0 0 0 0  Down, Depressed, Hopeless 0 0 0 0 0  PHQ - 2 Score 0 0 0 0 0   She notes her mood greatly improved with Prozac 5mg . She has klonopin but uses it very sparingly, and only uses it when really need it.   Anxiety She had a Mud[employer] who caused her a lot of stress and anxiety over the last semester. Students started hating her class, causing mood to be negative. This has resolved since the student teacher  leaving.   Past Medical History:  Diagnosis Date  . Allergy   . Anxiety   . Depression   . Thyroid disease    Prior to Admission medications   Medication Sig Start Date End Date Taking? Authorizing Provider  escitalopram (LEXAPRO) 5 MG tablet Take 5 mg by mouth daily.   Yes [provider]  fluticasone (FLONASE) 50 MCG/ACT nasal spray Place 2 sprays into both nostrils daily. 11/10/16  Yes Delia Chimes A, MD  hydrOXYzine (ATARAX/VISTARIL) 25 MG tablet Take 25 mg by mouth 3 (three) times daily as needed.   Yes [provider]  ketoconazole (NIZORAL) 2 % cream Apply 1 application topically daily.   Yes [provider]  levothyroxine (SYNTHROID, LEVOTHROID) 100 MCG tablet Take 1 tablet (100 mcg total) by mouth daily. 11/25/15  Yes Shawnee Knapp, MD  LORazepam (ATIVAN) 0.5 MG tablet Take 1-2 tablets (0.5-1 mg total) by mouth at bedtime as needed for anxiety. 11/11/16  Yes Shawnee Knapp, MD  Meth-Hyo-M Barnett Hatter Phos-Ph Sal (URIBEL) 118 MG CAPS Take by mouth.   Yes [provider]  nystatin (MYCOSTATIN/NYSTOP) powder Apply topically 4 (four) times daily. 11/17/15  Yes Shawnee Knapp, MD   Allergies  Allergen Reactions  . Sulfa Antibiotics Shortness Of Breath  . Vibramycin [Doxycycline Calcium]    Past Surgical History:  Procedure Laterality Date  . CESAREAN SECTION    . CHOLECYSTECTOMY  2003   Family History  Problem Relation Age of Onset  . Hypertension Father   . Hyperlipidemia Father   . Cancer Maternal Grandmother   . Diabetes Maternal Grandmother   . Hypertension Maternal Grandmother   . Heart disease Maternal Grandfather   . Hyperlipidemia Maternal Grandfather   . Hypertension Maternal Grandfather   . Diabetes Paternal Grandmother   . Stroke Paternal Grandmother   . Diabetes Paternal Grandfather   . Heart disease Paternal Grandfather   . Hyperlipidemia Paternal Grandfather   . Hypertension Paternal Grandfather    Social History   Social  History  . Marital status: Married    Spouse name: N/A  . Number of children: N/A  . Years of education: N/A   Social History Main Topics  . Smoking status: Never Smoker  . Smokeless tobacco: Never Used  . Alcohol use No  . Drug use: No  . Sexual activity: Not Asked   Other Topics Concern  . None   Social History Narrative  . None   Depression screen [facility] 2/9 11/18/2016 11/10/2016 08/22/2016 12/10/2015 11/17/2015  Decreased Interest 0 0 0 0 0  Down, Depressed, Hopeless 0 0 0 0 0  PHQ - 2 Score 0 0 0 0 0    Review of Systems  Constitutional: Negative for chills, fatigue, fever and unexpected weight change.  Respiratory: Negative for cough.   Gastrointestinal: Negative for constipation, diarrhea, nausea and vomiting.  Skin: Positive for rash. Negative for wound.  Neurological: Negative for dizziness, weakness and headaches.  Psychiatric/Behavioral: Negative for dysphoric mood. The patient is not nervous/anxious.        Objective:   Physical Exam  Constitutional: She is oriented to person, place, and time. She appears well-developed and well-nourished. No distress.  HENT:  Head: Normocephalic and atraumatic.  Eyes: Pupils are equal, round, and reactive to light. EOM are normal.  Neck: Neck supple.  Cardiovascular: Normal rate, regular rhythm, S1 normal, S2 normal and normal heart sounds.   No murmur heard. Pulmonary/Chest: Effort normal and breath sounds normal. No respiratory distress. She has no wheezes.  Abdominal: Soft. Bowel sounds are normal. She exhibits no distension. There is no tenderness.  Musculoskeletal: Normal range of motion.  Neurological: She is alert and oriented to person, place, and time.  Skin: Skin is warm and dry.  Psychiatric: She has a normal mood and affect. Her behavior is normal.  Nursing note and vitals reviewed.   BP 122/72   Pulse 84   Temp 98.5 F (36.9 C) (Oral)   Resp 16   Ht 5' 6.93" (1.7 m)   Wt 162 lb (73.5 kg)   SpO2 99%    BMI 25.43 kg/m      Assessment & Plan:   1. Acquired hypothyroidism - followed by obgyn Dr. Edwyna Ready but recheck today since doing labs - stable so sent refill of levothyroxine 100 mcg into pharm in case needed  2. Need for prophylactic vaccination and inoculation against influenza   3. Pruritic condition   4. Transaminitis - worsening on labs today - needs lipids checked, has stopped etoh since last yr  5. Diarrhea, unspecified type     Orders Placed This Encounter  Procedures  . Flu Vaccine QUAD 36+ mos IM  . Thyroid Panel With TSH  . Comprehensive metabolic panel    Order Specific Question:   Has the patient fasted?    Answer:   No  . CBC with Differential/Platelet    Meds ordered this  encounter  Medications  . LORazepam (ATIVAN) 0.5 MG tablet    Sig: Take 1-2 tablets (0.5-1 mg total) by mouth at bedtime as needed for anxiety.    Dispense:  30 tablet    Refill:  1  . cetirizine (ZYRTEC) 10 MG tablet    Sig: Take 1 tablet (10 mg total) by mouth at bedtime.    Dispense:  90 tablet    Refill:  3  . hydrOXYzine (ATARAX/VISTARIL) 25 MG tablet    Sig: Take 1 tablet (25 mg total) by mouth 3 (three) times daily as needed.    Dispense:  60 tablet    Refill:  2  . clindamycin (CLEOCIN T) 1 % lotion    Sig: Apply topically 2 (two) times daily.    Dispense:  60 mL    Refill:  5    I personally performed the services described in this documentation, which was scribed in my presence. The recorded information has been reviewed and considered, and addended by me as needed.   [name], M.D.  Primary Care at [facility] [address] [place], [place] [address] [phone] phone [phone] fax  11/19/16 5:51 AM

## 2016-11-18 NOTE — Patient Instructions (Addendum)
     IF you received an x-ray today, you will receive an invoice from [facility]. Please contact [facility] at [phone] with questions or concerns regarding your invoice.   IF you received labwork today, you will receive an invoice from [facility]. Please contact [facility] at [phone] with questions or concerns regarding your invoice.   Our billing staff will not be able to assist you with questions regarding bills from these companies.  You will be contacted with the lab results as soon as they are available. The fastest way to get your results is to activate your My Chart account. Instructions are located on the last page of this paperwork. If you have not heard from us regarding the results in 2 weeks, please contact this office.      Hypothyroidism Hypothyroidism is a disorder of the thyroid. The thyroid is a large gland that is located in the lower front of the neck. The thyroid releases hormones that control how the body works. With hypothyroidism, the thyroid does not make enough of these hormones. What are the causes? Causes of hypothyroidism may include:  Viral infections.  Pregnancy.  Your own defense system (immune system) attacking your thyroid.  Certain medicines.  Birth defects.  Past radiation treatments to your head or neck.  Past treatment with radioactive iodine.  Past surgical removal of part or all of your thyroid.  Problems with the gland that is located in the center of your brain (pituitary). What are the signs or symptoms? Signs and symptoms of hypothyroidism may include:  Feeling as though you have no energy (lethargy).  Inability to tolerate cold.  Weight gain that is not explained by a change in diet or exercise habits.  Dry skin.  Coarse hair.  Menstrual irregularity.  Slowing of thought processes.  Constipation.  Sadness or depression. How is this diagnosed? Your health care provider may diagnose  hypothyroidism with blood tests and ultrasound tests. How is this treated? Hypothyroidism is treated with medicine that replaces the hormones that your body does not make. After you begin treatment, it may take several weeks for symptoms to go away. Follow these instructions at home:  Take medicines only as directed by your health care provider.  If you start taking any new medicines, tell your health care provider.  Keep all follow-up visits as directed by your health care provider. This is important. As your condition improves, your dosage needs may change. You will need to have blood tests regularly so that your health care provider can watch your condition. Contact a health care provider if:  Your symptoms do not get better with treatment.  You are taking thyroid replacement medicine and:  You sweat excessively.  You have tremors.  You feel anxious.  You lose weight rapidly.  You cannot tolerate heat.  You have emotional swings.  You have diarrhea.  You feel weak. Get help right away if:  You develop chest pain.  You develop an irregular heartbeat.  You develop a rapid heartbeat. This information is not intended to replace advice given to you by your health care provider. Make sure you discuss any questions you have with your health care provider. Document Released: 01/24/2005 Document Revised: 07/02/2015 Document Reviewed: 06/11/2013 Elsevier Interactive Patient Education  2017 [employer] Inc.  

## 2016-11-20 LAB — CBC WITH DIFFERENTIAL/PLATELET
BASOS ABS: 0 10*3/uL (ref 0.0–0.2)
BASOS: 0 %
EOS (ABSOLUTE): 0.1 10*3/uL (ref 0.0–0.4)
Eos: 1 %
Hematocrit: 44.3 % (ref 34.0–46.6)
Hemoglobin: 14.8 g/dL (ref 11.1–15.9)
IMMATURE GRANS (ABS): 0 10*3/uL (ref 0.0–0.1)
Immature Granulocytes: 0 %
LYMPHS ABS: 2 10*3/uL (ref 0.7–3.1)
LYMPHS: 19 %
MCH: 30.1 pg (ref 26.6–33.0)
MCHC: 33.4 g/dL (ref 31.5–35.7)
MCV: 90 fL (ref 79–97)
MONOS ABS: 0.7 10*3/uL (ref 0.1–0.9)
Monocytes: 7 %
NEUTROS ABS: 7.8 10*3/uL — AB (ref 1.4–7.0)
Neutrophils: 73 %
PLATELETS: 305 10*3/uL (ref 150–379)
RBC: 4.91 x10E6/uL (ref 3.77–5.28)
RDW: 13.1 % (ref 12.3–15.4)
WBC: 10.7 10*3/uL (ref 3.4–10.8)

## 2016-11-20 LAB — COMPREHENSIVE METABOLIC PANEL
A/G RATIO: 2 (ref 1.2–2.2)
ALT: 67 IU/L — AB (ref 0–32)
AST: 41 IU/L — ABNORMAL HIGH (ref 0–40)
Albumin: 4.7 g/dL (ref 3.5–5.5)
Alkaline Phosphatase: 133 IU/L — ABNORMAL HIGH (ref 39–117)
BILIRUBIN TOTAL: 0.3 mg/dL (ref 0.0–1.2)
BUN/Creatinine Ratio: 13 (ref 9–23)
BUN: 11 mg/dL (ref 6–24)
CALCIUM: 9.5 mg/dL (ref 8.7–10.2)
CHLORIDE: 101 mmol/L (ref 96–106)
CO2: 21 mmol/L (ref 20–29)
Creatinine, Ser: 0.83 mg/dL (ref 0.57–1.00)
GFR, EST AFRICAN AMERICAN: 97 mL/min/{1.73_m2} (ref >59)
GFR, EST NON AFRICAN AMERICAN: 84 mL/min/{1.73_m2} (ref >59)
GLOBULIN, TOTAL: 2.4 g/dL (ref 1.5–4.5)
GLUCOSE: 80 mg/dL (ref 65–99)
POTASSIUM: 3.8 mmol/L (ref 3.5–5.2)
SODIUM: 139 mmol/L (ref 134–144)
TOTAL PROTEIN: 7.1 g/dL (ref 6.0–8.5)

## 2016-11-20 LAB — THYROID PANEL WITH TSH
Free Thyroxine Index: 2.1 (ref 1.2–4.9)
T3 Uptake Ratio: 24 % (ref 24–39)
T4, Total: 8.9 ug/dL (ref 4.5–12.0)
TSH: 1.13 u[IU]/mL (ref 0.450–4.500)

## 2016-11-30 ENCOUNTER — Encounter: Payer: Self-pay | Admitting: Radiology

## 2016-11-30 MED ORDER — LEVOTHYROXINE SODIUM 100 MCG PO TABS: 100.0000 ug | ORAL_TABLET | Freq: Every day | ORAL | 3 refills | Status: DC

## 2016-11-30 NOTE — Addendum Note (Signed)
Addended by: Shawnee Knapp on: 11/30/2016 03:08 AM   Modules accepted: Orders

## 2017-03-16 ENCOUNTER — Ambulatory Visit: Payer: BC Managed Care – PPO | Admitting: Urgent Care

## 2017-03-16 ENCOUNTER — Encounter: Payer: Self-pay | Admitting: Urgent Care

## 2017-03-16 VITALS — BP 117/70 | HR 81 | Temp 98.6°F | Resp 18 | Ht 66.0 in | Wt 174.6 lb

## 2017-03-16 DIAGNOSIS — R69 Illness, unspecified: Secondary | ICD-10-CM | POA: Diagnosis not present

## 2017-03-16 DIAGNOSIS — R110 Nausea: Secondary | ICD-10-CM

## 2017-03-16 DIAGNOSIS — R05 Cough: Secondary | ICD-10-CM

## 2017-03-16 DIAGNOSIS — R51 Headache: Secondary | ICD-10-CM

## 2017-03-16 DIAGNOSIS — R059 Cough, unspecified: Secondary | ICD-10-CM

## 2017-03-16 DIAGNOSIS — R52 Pain, unspecified: Secondary | ICD-10-CM | POA: Diagnosis not present

## 2017-03-16 DIAGNOSIS — J111 Influenza due to unidentified influenza virus with other respiratory manifestations: Secondary | ICD-10-CM

## 2017-03-16 DIAGNOSIS — R519 Headache, unspecified: Secondary | ICD-10-CM

## 2017-03-16 MED ORDER — PSEUDOEPHEDRINE HCL ER 120 MG PO TB12: 120.0000 mg | ORAL_TABLET | Freq: Two times a day (BID) | ORAL | 3 refills | Status: DC

## 2017-03-16 MED ORDER — BENZONATATE 100 MG PO CAPS: 100.0000 mg | ORAL_CAPSULE | Freq: Three times a day (TID) | ORAL | 0 refills | Status: DC | PRN

## 2017-03-16 MED ORDER — OSELTAMIVIR PHOSPHATE 75 MG PO CAPS: 75.0000 mg | ORAL_CAPSULE | Freq: Two times a day (BID) | ORAL | 0 refills | Status: DC

## 2017-03-16 MED ORDER — HYDROCODONE-HOMATROPINE 5-1.5 MG/5ML PO SYRP: 5.0000 mL | ORAL_SOLUTION | Freq: Every evening | ORAL | 0 refills | Status: DC | PRN

## 2017-03-16 NOTE — Progress Notes (Signed)
  MRN: [id number] DOB: September 09, 1970  Subjective:   [name] is a 47 y.o. female presenting for 2 day history of chills, body aches, headaches, facial pain, ear pain, sore throat, sneezing, has chest burning when talks for long periods of time, nausea without vomiting, decreased appetite. Uses Flonase and Allegra for allergies. She is unable to take APAP due to elevated liver enzymes. Has used Thera-flu, Advil cold/flu. Denies smoking cigarettes. Denies asthma. Denies fever, belly pain. Teaches at elementary school, has had several sick contacts.   [name] has a current medication list which includes the following prescription(s): cetirizine, clindamycin, escitalopram, fluticasone, hydroxyzine, levothyroxine, lorazepam, and uribel. Also is allergic to sulfa antibiotics and vibramycin [doxycycline calcium].  [name]  has a past medical history of Allergy, Anxiety, Depression, and Thyroid disease. Also  has a past surgical history that includes Cesarean section and Cholecystectomy (2003).  Objective:   Vitals: BP 117/70   Pulse 81   Temp 98.6 F (37 C) (Oral)   Resp 18   Ht 5\' 6"  (1.676 m)   Wt 174 lb 9.6 oz (79.2 kg)   SpO2 96%   BMI 28.18 kg/m   Physical Exam  Constitutional: She is oriented to person, place, and time. She appears well-developed and well-nourished.  HENT:  TM's intact bilaterally, no effusions or erythema. Nasal turbinates pink, moist, nasal passages patent. No sinus tenderness. Oropharynx clear, mucous membranes moist.  Eyes: Right eye exhibits no discharge. Left eye exhibits no discharge.  Neck: Normal range of motion. Neck supple.  Cardiovascular: Normal rate, regular rhythm and intact distal pulses. Exam reveals no gallop and no friction rub.  No murmur heard. Pulmonary/Chest: No respiratory distress. She has no wheezes. She has no rales.  Neurological: She is alert and oriented to person, place, and time.  Skin: Skin is warm and dry.  Psychiatric: She has a  normal mood and affect.   Assessment and Plan :   Influenza-like illness  Body aches  Cough  Generalized headaches  Nausea without vomiting  Will manage with Tamiflu given that patient works in [employer] and is being asked to cover for other teachers as well that have had illness. Supportive care offered otherwise. Return-to-clinic precautions discussed, patient verbalized understanding.   Jaynee Eagles, PA-C [facility] [phone] 03/16/2017  3:54 PM

## 2017-03-16 NOTE — Patient Instructions (Addendum)
Influenza, Adult Influenza, more commonly known as "the flu," is a viral infection that primarily affects the respiratory tract. The respiratory tract includes organs that help you breathe, such as the lungs, nose, and throat. The flu causes many common cold symptoms, as well as a high fever and body aches. The flu spreads easily from person to person (is contagious). Getting a flu shot (influenza vaccination) every year is the best way to prevent influenza. What are the causes? Influenza is caused by a virus. You can catch the virus by:  Breathing in droplets from an infected person's cough or sneeze.  Touching something that was recently contaminated with the virus and then touching your mouth, nose, or eyes.  What increases the risk? The following factors may make you more likely to get the flu:  Not cleaning your hands frequently with soap and water or alcohol-based hand sanitizer.  Having close contact with many people during cold and flu season.  Touching your mouth, eyes, or nose without washing or sanitizing your hands first.  Not drinking enough fluids or not eating a healthy diet.  Not getting enough sleep or exercise.  Being under a high amount of stress.  Not getting a yearly (annual) flu shot.  You may be at a higher risk of complications from the flu, such as a severe lung infection (pneumonia), if you:  Are over the age of 65.  Are pregnant.  Have a weakened disease-fighting system (immune system). You may have a weakened immune system if you: ? Have HIV or AIDS. ? Are undergoing chemotherapy. ? Aretaking medicines that reduce the activity of (suppress) the immune system.  Have a long-term (chronic) illness, such as heart disease, kidney disease, diabetes, or lung disease.  Have a liver disorder.  Are obese.  Have anemia.  What are the signs or symptoms? Symptoms of this condition typically last 4-10 days and may  include:  Fever.  Chills.  Headache, body aches, or muscle aches.  Sore throat.  Cough.  Runny or congested nose.  Chest discomfort and cough.  Poor appetite.  Weakness or tiredness (fatigue).  Dizziness.  Nausea or vomiting.  How is this diagnosed? This condition may be diagnosed based on your medical history and a physical exam. Your health care provider may do a nose or throat swab test to confirm the diagnosis. How is this treated? If influenza is detected early, you can be treated with antiviral medicine that can reduce the length of your illness and the severity of your symptoms. This medicine may be given by mouth (orally) or through an IV tube that is inserted in one of your veins. The goal of treatment is to relieve symptoms by taking care of yourself at home. This may include taking over-the-counter medicines, drinking plenty of fluids, and adding humidity to the air in your home. In some cases, influenza goes away on its own. Severe influenza or complications from influenza may be treated in a hospital. Follow these instructions at home:  Take over-the-counter and prescription medicines only as told by your health care provider.  Use a cool mist humidifier to add humidity to the air in your home. This can make breathing easier.  Rest as needed.  Drink enough fluid to keep your urine clear or pale yellow.  Cover your mouth and nose when you cough or sneeze.  Wash your hands with soap and water often, especially after you cough or sneeze. If soap and water are not available, use hand sanitizer.    Stay home from work or school as told by your health care provider. Unless you are visiting your health care provider, try to avoid leaving home until your fever has been gone for 24 hours without the use of medicine.  Keep all follow-up visits as told by your health care provider. This is important. How is this prevented?  Getting an annual flu shot is the best way  to avoid getting the flu. You may get the flu shot in late summer, fall, or winter. Ask your health care provider when you should get your flu shot.  Wash your hands often or use hand sanitizer often.  Avoid contact with people who are sick during cold and flu season.  Eat a healthy diet, drink plenty of fluids, get enough sleep, and exercise regularly. Contact a health care provider if:  You develop new symptoms.  You have: ? Chest pain. ? Diarrhea. ? A fever.  Your cough gets worse.  You produce more mucus.  You feel nauseous or you vomit. Get help right away if:  You develop shortness of breath or difficulty breathing.  Your skin or nails turn a bluish color.  You have severe pain or stiffness in your neck.  You develop a sudden headache or sudden pain in your face or ear.  You cannot stop vomiting. This information is not intended to replace advice given to you by your health care provider. Make sure you discuss any questions you have with your health care provider. Document Released: 01/22/2000 Document Revised: 07/02/2015 Document Reviewed: 11/18/2014 Elsevier Interactive Patient Education  2017 [employer].    IF you received an x-ray today, you will receive an invoice from [facility]. Please contact [facility] at [phone] with questions or concerns regarding your invoice.   IF you received labwork today, you will receive an invoice from [facility]. Please contact [facility] at [phone] with questions or concerns regarding your invoice.   Our billing staff will not be able to assist you with questions regarding bills from these companies.  You will be contacted with the lab results as soon as they are available. The fastest way to get your results is to activate your My Chart account. Instructions are located on the last page of this paperwork. If you have not heard from [place] regarding the results in 2 weeks, please contact this office.

## 2018-01-17 ENCOUNTER — Ambulatory Visit: Payer: Self-pay | Admitting: *Deleted

## 2018-01-17 NOTE — Telephone Encounter (Signed)
Pt's husband calling stating that the pt has had cold symptoms for the past 4 days that have progressively worsened for the past 2 days. Pt's husband states that the pt is loosing her voice with head congestion and chest pain with talking, breathing and coughing. Currently rates pain at 7. Pt states she has been taking Mucinex. Pt states that her throat is sore and her tonsils feel swollen. Earliest appt available on 01/19/18. Pt placed on wait list advised to seek treatment at Urgent Care is symptoms become worse before scheduled appt. Pt's husband verbalized understanding.   Answer Assessment - Initial Assessment Questions 1. DESCRIPTION: "Describe your voice."     Loosing voice 2. ONSET: "When did the hoarseness begin?"    Began 4 days ago, but worsened the last 2 days 3. COUGH: "Is there a cough?" If so, ask: "How bad?"     yes 4. FEVER: "Do you have a fever?" If so, ask: "What is your temperature, how was it measured, and when did it start?"     No 5. RESPIRATORY STATUS: "Describe your breathing."      No complaints of difficulty breathing 6. ALLERGIES: "Any allergy symptoms?" If so, ask: "What are they?"     No 7. IRRITANTS: "Do you smoke?" "Have you been exposed to any irritating fumes?"     No 8. CAUSE: "What do you think is causing the hoarseness?"     Has experienced cold like symptoms for the past 4 days 9. OTHER SYMPTOMS: "Do you have any other symptoms?" (e.g., sore throat, swelling, foreign body, rash)     Sore throat and tonsils feel swollen, chest pain with breathing, talking and coughing 10. PREGNANCY: "Is there any chance you are pregnant?" "When was your last menstrual period?"       Not assessed  Protocols used: HOARSENESS-A-AH

## 2018-01-19 ENCOUNTER — Ambulatory Visit: Payer: BC Managed Care – PPO | Admitting: Family Medicine

## 2018-01-19 ENCOUNTER — Other Ambulatory Visit: Payer: Self-pay

## 2018-01-19 ENCOUNTER — Encounter: Payer: Self-pay | Admitting: Family Medicine

## 2018-01-19 ENCOUNTER — Encounter

## 2018-01-19 VITALS — BP 121/80 | HR 88 | Temp 98.9°F | Ht 66.0 in | Wt 175.6 lb

## 2018-01-19 DIAGNOSIS — J0100 Acute maxillary sinusitis, unspecified: Secondary | ICD-10-CM

## 2018-01-19 MED ORDER — FLUCONAZOLE 150 MG PO TABS: 150.0000 mg | ORAL_TABLET | Freq: Once | ORAL | 0 refills | Status: AC

## 2018-01-19 MED ORDER — FLUTICASONE PROPIONATE 50 MCG/ACT NA SUSP: 2.0000 | Freq: Every day | NASAL | 6 refills | Status: DC

## 2018-01-19 MED ORDER — BENZONATATE 100 MG PO CAPS: 100.0000 mg | ORAL_CAPSULE | Freq: Three times a day (TID) | ORAL | 0 refills | Status: DC | PRN

## 2018-01-19 MED ORDER — AMOXICILLIN-POT CLAVULANATE 875-125 MG PO TABS: 1.0000 | ORAL_TABLET | Freq: Two times a day (BID) | ORAL | 0 refills | Status: DC

## 2018-01-19 NOTE — Progress Notes (Signed)
12/13/201911:39 AM  [name] Sep 28, 1970, 47 y.o. female [id number]  Chief Complaint  Patient presents with  . Sore Throat    just got over hoarsness, productive cough, and chest discomfort and burning. This has been going on for a while, yet it comes and goes. Other symptoms being diarrhea, rib pain due to the coughing.    HPI:   Patient is a 47 y.o. 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 0 No No Yes Yes  Number falls in past yr: - - - 1 2 or more  Injury with Fall? - - - Yes No     Depression screen [facility] 2/9 01/19/2018 11/18/2016 11/10/2016  Decreased Interest 0 0 0  Down, Depressed, Hopeless 0 0 0  PHQ - 2 Score 0 0 0    Allergies  Allergen Reactions  . Sulfa Antibiotics Shortness Of Breath  . Vibramycin [Doxycycline Calcium]     Prior to Admission medications   Medication Sig Start Date End Date Taking? Authorizing Provider  benzonatate (TESSALON) 100 MG capsule Take 1-2 capsules (100-200 mg total) by mouth 3 (three) times daily as needed. 03/16/17  Yes Jaynee Eagles, PA-C  cetirizine (ZYRTEC) 10 MG tablet Take 1 tablet (10 mg total) by mouth at bedtime. 11/18/16  Yes Shawnee Knapp, MD  clindamycin (CLEOCIN T) 1 % lotion Apply topically 2 (two) times daily.  11/18/16  Yes Shawnee Knapp, MD  escitalopram (LEXAPRO) 5 MG tablet Take 5 mg by mouth daily.   Yes [provider]  fluticasone (FLONASE) 50 MCG/ACT nasal spray Place 2 sprays into both nostrils daily. 11/10/16  Yes Stallings, Zoe A, MD  HYDROcodone-homatropine (HYCODAN) 5-1.5 MG/5ML syrup Take 5 mLs by mouth at bedtime as needed. 03/16/17  Yes Jaynee Eagles, PA-C  hydrOXYzine (ATARAX/VISTARIL) 25 MG tablet Take 1 tablet (25 mg total) by mouth 3 (three) times daily as needed. 11/18/16  Yes Shawnee Knapp, MD  levothyroxine (SYNTHROID, LEVOTHROID) 100 MCG tablet Take 1 tablet (100 mcg total) by mouth daily. 11/30/16  Yes Shawnee Knapp, MD  LORazepam (ATIVAN) 0.5 MG tablet Take 1-2 tablets (0.5-1 mg total) by mouth at bedtime as needed for anxiety. 11/18/16  Yes Shawnee Knapp, MD  Meth-Hyo-M Barnett Hatter Phos-Ph Sal (URIBEL) 118 MG CAPS Take by mouth.   Yes [provider]    Past Medical History:  Diagnosis Date  . Allergy   . Anxiety   . Depression   . Thyroid disease     Past Surgical History:  Procedure Laterality Date  . CESAREAN SECTION    . CHOLECYSTECTOMY  2003    Social History   Tobacco Use  . Smoking status: Never Smoker  . Smokeless tobacco: Never Used  Substance Use Topics  . Alcohol use: No    Family  History  Problem Relation Age of Onset  . Hypertension Father   . Hyperlipidemia Father   . Cancer Maternal Grandmother   . Diabetes Maternal Grandmother   . Hypertension Maternal Grandmother   . Heart disease Maternal Grandfather   . Hyperlipidemia Maternal Grandfather   . Hypertension Maternal Grandfather   . Diabetes Paternal Grandmother   . Stroke Paternal Grandmother   . Diabetes Paternal Grandfather   . Heart disease Paternal Grandfather   . Hyperlipidemia Paternal Grandfather   . Hypertension Paternal Grandfather     ROS Per hpi  OBJECTIVE:  Blood pressure 121/80, pulse 88, temperature 98.9 F (37.2 C), temperature source Oral, height 5\' 6"  (1.676  m), weight 175 lb 9.6 oz (79.7 kg), SpO2 96 %. Body mass index is 28.34 kg/m.   Physical Exam Vitals signs and nursing note reviewed.  Constitutional:      Appearance: She is well-developed.  HENT:     Head: Normocephalic and atraumatic.     Right Ear: Hearing, tympanic membrane, ear canal and external ear normal.     Left Ear: Hearing, tympanic membrane, ear canal and external ear normal.     Nose:     Right Turbinates: Enlarged and swollen.     Left Turbinates: Enlarged and swollen.     Right Sinus: Maxillary sinus tenderness present.     Left Sinus: Maxillary sinus tenderness present.     Mouth/Throat:     Pharynx: Posterior oropharyngeal erythema present. No pharyngeal swelling or oropharyngeal exudate.  Eyes:     Conjunctiva/sclera: Conjunctivae normal.     Pupils: Pupils are equal, round, and reactive to light.  Neck:     Musculoskeletal: Neck supple.  Cardiovascular:     Rate and Rhythm: Normal rate and regular rhythm.     Heart sounds: Normal heart sounds. No murmur. No friction rub. No gallop.   Pulmonary:     Effort: Pulmonary effort is normal.     Breath sounds: Normal breath sounds. No wheezing or rales.  Lymphadenopathy:     Cervical: No cervical adenopathy.  Skin:    General: Skin is warm and dry.  Neurological:     Mental Status: She is alert and oriented to person, place, and time.    ASSESSMENT and PLAN  1. Acute non-recurrent maxillary sinusitis Discussed supportive measures, new meds r/se/b and RTC precautions. Patient educational handout given.  Other orders - amoxicillin-clavulanate (AUGMENTIN) 875-125 MG tablet; Take 1 tablet by mouth 2 (two) times daily. - fluconazole (DIFLUCAN) 150 MG tablet; Take 1 tablet (150 mg total) by mouth once for 1 dose. Repeat if needed - benzonatate (TESSALON) 100 MG capsule; Take 1-2 capsules (100-200 mg total) by mouth 3 (three) times daily as needed. - fluticasone (FLONASE) 50 MCG/ACT nasal spray; Place 2 sprays  into both nostrils daily.  Return if symptoms worsen or fail to improve.    Rutherford Guys, MD [facility] [place], [place] [address] Ph.  [phone] Fax [phone]

## 2018-01-19 NOTE — Patient Instructions (Addendum)
   If you have lab work done today you will be contacted with your lab results within the next 2 weeks.  If you have not heard from us then please contact us. The fastest way to get your results is to register for My Chart.   IF you received an x-ray today, you will receive an invoice from [facility]. Please contact [facility] at [phone] with questions or concerns regarding your invoice.   IF you received labwork today, you will receive an invoice from [facility]. Please contact [facility] at [phone] with questions or concerns regarding your invoice.   Our billing staff will not be able to assist you with questions regarding bills from these companies.  You will be contacted with the lab results as soon as they are available. The fastest way to get your results is to activate your My Chart account. Instructions are located on the last page of this paperwork. If you have not heard from us regarding the results in 2 weeks, please contact this office.     Sinusitis, Adult Sinusitis is soreness and inflammation of your sinuses. Sinuses are hollow spaces in the bones around your face. They are located:  Around your eyes.  In the middle of your forehead.  Behind your nose.  In your cheekbones.  Your sinuses and nasal passages are lined with a stringy fluid (mucus). Mucus normally drains out of your sinuses. When your nasal tissues get inflamed or swollen, the mucus can get trapped or blocked so air cannot flow through your sinuses. This lets bacteria, viruses, and funguses grow, and that leads to infection. Follow these instructions at home: Medicines  Take, use, or apply over-the-counter and prescription medicines only as told by your doctor. These may include nasal sprays.  If you were prescribed an antibiotic medicine, take it as told by your doctor. Do not stop taking the antibiotic even if you start to feel better. Hydrate and Humidify  Drink enough  water to keep your pee (urine) clear or pale yellow.  Use a cool mist humidifier to keep the humidity level in your home above 50%.  Breathe in steam for 10-15 minutes, 3-4 times a day or as told by your doctor. You can do this in the bathroom while a hot shower is running.  Try not to spend time in cool or dry air. Rest  Rest as much as possible.  Sleep with your head raised (elevated).  Make sure to get enough sleep each night. General instructions  Put a warm, moist washcloth on your face 3-4 times a day or as told by your doctor. This will help with discomfort.  Wash your hands often with soap and water. If there is no soap and water, use hand sanitizer.  Do not smoke. Avoid being around people who are smoking (secondhand smoke).  Keep all follow-up visits as told by your doctor. This is important. Contact a doctor if:  You have a fever.  Your symptoms get worse.  Your symptoms do not get better within 10 days. Get help right away if:  You have a very bad headache.  You cannot stop throwing up (vomiting).  You have pain or swelling around your face or eyes.  You have trouble seeing.  You feel confused.  Your neck is stiff.  You have trouble breathing. This information is not intended to replace advice given to you by your health care provider. Make sure you discuss any questions you have with your health   care provider. Document Released: 07/13/2007 Document Revised: 09/20/2015 Document Reviewed: 11/19/2014 Elsevier Interactive Patient Education  2018 [employer]  

## 2018-01-23 ENCOUNTER — Ambulatory Visit: Payer: Self-pay

## 2018-01-23 NOTE — Telephone Encounter (Signed)
Contacted pt. Per phone; reported she started new medications on Friday; Augmentin and Benzonatate.  Started having itching of the eyes on Saturday.  Has a red bump on left eyelid since Monday.  C/o dark circles around the eyes and aching around the eyes.  C/o itching of the eyes. C/o headache. Also c/o pink flushing of cheeks.  Denied any rash, swelling of face, mouth, tongue or throat.  Reported onset of diarrhea today; has had 3 loose brown stools today.  Feels "clammy".  Took last dose of Augmentin at 8:00 PM 12/16;  Took last dose of Benzonatate 2 caps, at 6:30 AM.   Voiced concern of allergic reaction to the medications.  The pt. 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    Called [facility]; spoke with Wilfred Curtis, RN.  Advised the pt. Will need to be evaluated in the office.    Called pt. Back.  Offered appt. Tomorrow at 3:20 PM.  Advised to hold the antibiotic and Benzonatate until she is further eval. For allergic reaction.  Recommended to take Benadryl when she gets home, for poss. Reaction.  Advised to go to UC or ER tonight, if her symptoms worse.  Verb. Understanding.  Agrees with plan.         Reason for Disposition . Caller has URGENT medication question about med that PCP prescribed and triager unable to answer question  Answer Assessment - Initial Assessment Questions 1. SYMPTOMS: "Do you have any symptoms?"     Itching of eyes, red bump left eyelid, dark circles around eyes, aching around eyes, headache, "pink-flushed" cheeks. 2. SEVERITY: If symptoms are present, ask "Are they mild, moderate or severe?"     Moderate to severe  Protocols used: MEDICATION QUESTION CALL-A-AH

## 2018-01-24 ENCOUNTER — Ambulatory Visit: Payer: Self-pay | Admitting: Family Medicine

## 2018-02-02 ENCOUNTER — Encounter: Payer: Self-pay | Admitting: Family Medicine

## 2018-02-02 ENCOUNTER — Ambulatory Visit: Payer: BC Managed Care – PPO | Admitting: Family Medicine

## 2018-02-02 VITALS — BP 105/70 | HR 83 | Temp 98.4°F | Resp 16 | Ht 66.0 in | Wt 176.4 lb

## 2018-02-02 DIAGNOSIS — Z114 Encounter for screening for human immunodeficiency virus [HIV]: Secondary | ICD-10-CM

## 2018-02-02 DIAGNOSIS — B373 Candidiasis of vulva and vagina: Secondary | ICD-10-CM | POA: Diagnosis not present

## 2018-02-02 DIAGNOSIS — Z5181 Encounter for therapeutic drug level monitoring: Secondary | ICD-10-CM

## 2018-02-02 DIAGNOSIS — F411 Generalized anxiety disorder: Secondary | ICD-10-CM

## 2018-02-02 DIAGNOSIS — Z1329 Encounter for screening for other suspected endocrine disorder: Secondary | ICD-10-CM

## 2018-02-02 DIAGNOSIS — Z13228 Encounter for screening for other metabolic disorders: Secondary | ICD-10-CM

## 2018-02-02 DIAGNOSIS — E039 Hypothyroidism, unspecified: Secondary | ICD-10-CM | POA: Diagnosis not present

## 2018-02-02 DIAGNOSIS — J324 Chronic pansinusitis: Secondary | ICD-10-CM

## 2018-02-02 DIAGNOSIS — L500 Allergic urticaria: Secondary | ICD-10-CM | POA: Insufficient documentation

## 2018-02-02 DIAGNOSIS — N898 Other specified noninflammatory disorders of vagina: Secondary | ICD-10-CM

## 2018-02-02 DIAGNOSIS — B9689 Other specified bacterial agents as the cause of diseases classified elsewhere: Secondary | ICD-10-CM

## 2018-02-02 DIAGNOSIS — Z0000 Encounter for general adult medical examination without abnormal findings: Secondary | ICD-10-CM

## 2018-02-02 DIAGNOSIS — B3731 Acute candidiasis of vulva and vagina: Secondary | ICD-10-CM

## 2018-02-02 DIAGNOSIS — Z0001 Encounter for general adult medical examination with abnormal findings: Secondary | ICD-10-CM | POA: Diagnosis not present

## 2018-02-02 DIAGNOSIS — Z130 Encounter for screening for diseases of the blood and blood-forming organs and certain disorders involving the immune mechanism: Secondary | ICD-10-CM

## 2018-02-02 DIAGNOSIS — Z1321 Encounter for screening for nutritional disorder: Secondary | ICD-10-CM

## 2018-02-02 DIAGNOSIS — D229 Melanocytic nevi, unspecified: Secondary | ICD-10-CM

## 2018-02-02 DIAGNOSIS — N760 Acute vaginitis: Secondary | ICD-10-CM

## 2018-02-02 DIAGNOSIS — Z113 Encounter for screening for infections with a predominantly sexual mode of transmission: Secondary | ICD-10-CM

## 2018-02-02 DIAGNOSIS — E7800 Pure hypercholesterolemia, unspecified: Secondary | ICD-10-CM | POA: Insufficient documentation

## 2018-02-02 LAB — POCT WET + KOH PREP: Trich by wet prep: ABSENT

## 2018-02-02 LAB — POCT CBC
GRANULOCYTE PERCENT: 72.4 % (ref 37–80)
HCT, POC: 43.5 % — AB (ref 29–41)
HEMOGLOBIN: 14.9 g/dL — AB (ref 11–14.6)
Lymph, poc: 1.3 (ref 0.6–3.4)
MCH: 30.2 pg (ref 27–31.2)
MCHC: 34.4 g/dL (ref 31.8–35.4)
MCV: 87.8 fL (ref 76–111)
MID (cbc): 0.5 (ref 0–0.9)
MPV: 6.5 fL (ref 0–99.8)
PLATELET COUNT, POC: 342 10*3/uL (ref 142–424)
POC Granulocyte: 4.8 (ref 2–6.9)
POC LYMPH PERCENT: 20.1 %L (ref 10–50)
POC MID %: 7.5 % (ref 0–12)
RBC: 4.95 M/uL (ref 4.04–5.48)
RDW, POC: 13.6 %
WBC: 6.6 10*3/uL (ref 4.6–10.2)

## 2018-02-02 LAB — POCT URINALYSIS DIP (MANUAL ENTRY)
GLUCOSE UA: NEGATIVE mg/dL
Ketones, POC UA: NEGATIVE mg/dL
Leukocytes, UA: NEGATIVE
NITRITE UA: NEGATIVE
PH UA: 5.5 (ref 5.0–8.0)
RBC UA: NEGATIVE
Spec Grav, UA: 1.025 (ref 1.010–1.025)
UROBILINOGEN UA: 0.2 U/dL

## 2018-02-02 MED ORDER — LORAZEPAM 0.5 MG PO TABS: 0.5000 mg | ORAL_TABLET | Freq: Every evening | ORAL | 2 refills | Status: DC | PRN

## 2018-02-02 MED ORDER — HYDROXYZINE HCL 25 MG PO TABS: 25.0000 mg | ORAL_TABLET | Freq: Three times a day (TID) | ORAL | 4 refills | Status: DC | PRN

## 2018-02-02 MED ORDER — METRONIDAZOLE 0.75 % VA GEL: 1.0000 | Freq: Every day | VAGINAL | 0 refills | Status: DC

## 2018-02-02 MED ORDER — CETIRIZINE HCL 10 MG PO TABS: 10.0000 mg | ORAL_TABLET | Freq: Every day | ORAL | 3 refills | Status: DC

## 2018-02-02 MED ORDER — LEVOFLOXACIN 750 MG PO TABS: 750.0000 mg | ORAL_TABLET | Freq: Every day | ORAL | 0 refills | Status: DC

## 2018-02-02 MED ORDER — METHYLPREDNISOLONE ACETATE 80 MG/ML IJ SUSP
80.0000 mg | Freq: Once | INTRAMUSCULAR | Status: AC
  Administered 2018-02-02: 80 mg via INTRAMUSCULAR

## 2018-02-02 MED ORDER — ESCITALOPRAM OXALATE 10 MG PO TABS: 10.0000 mg | ORAL_TABLET | Freq: Every day | ORAL | 3 refills | Status: DC

## 2018-02-02 MED ORDER — FLUCONAZOLE 150 MG PO TABS: ORAL_TABLET | ORAL | 0 refills | Status: DC

## 2018-02-02 NOTE — Progress Notes (Signed)
Subjective:    Patient: [name]  DOB: [birth year]; 47 y.o.   MRN: [id number]  Chief Complaint  Patient presents with  . Medication Refill    on all her medication and discuss cholesterol; wants blood work  . referral    wants a referral to Dermatology    HPI Had an allergic reaction to the augmentin that she was prescribed.  Completely lost voice and developed worsening URI sxs, then eyes started swelling after.  She took the fluconazole the first day she started the antibiotic. Still smells stinky after urinating and has vaginal issues She was on a zpack prior to that for a sinus that was rx'd by her dentist when she had teeth imaging at check up.  Uses [facility].  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 Causes urticaria.   Medical History Past Medical History:  Diagnosis Date  . Allergy   . Anxiety   . Depression   . Thyroid disease    Past Surgical History:  Procedure Laterality Date  . CESAREAN SECTION    . CHOLECYSTECTOMY  2003   Current Outpatient Medications on File Prior to Visit  Medication Sig Dispense Refill  . cetirizine (ZYRTEC) 10 MG tablet Take 1 tablet (10 mg total) by mouth at bedtime. 90 tablet 3  . clindamycin (CLEOCIN T) 1 % lotion Apply topically 2 (two) times daily. 60 mL 5  . escitalopram (LEXAPRO) 5 MG tablet Take 5 mg by mouth daily.    . fluticasone (FLONASE) 50 MCG/ACT nasal spray Place 2 sprays into both nostrils daily. 16 g 6  . hydrOXYzine (ATARAX/VISTARIL) 25 MG tablet Take 1 tablet (25 mg total) by mouth 3 (three) times daily as needed. 60 tablet 2  . levothyroxine (SYNTHROID, LEVOTHROID) 100 MCG tablet Take 1 tablet (100 mcg total) by mouth daily. 90 tablet 3  . LORazepam (ATIVAN) 0.5 MG tablet Take 1-2 tablets (0.5-1 mg total) by mouth at bedtime as needed for anxiety. 30 tablet 1  . Meth-Hyo-M Bl-Na Phos-Ph Sal (URIBEL) 118 MG CAPS Take by mouth.     No current facility-administered medications on file prior  to visit.    Allergies  Allergen Reactions  . Sulfa Antibiotics Shortness Of Breath  . Vibramycin [Doxycycline Calcium]    Family History  Problem Relation Age of Onset  . Hypertension Father   . Hyperlipidemia Father   . Cancer Maternal Grandmother   . Diabetes Maternal Grandmother   . Hypertension Maternal Grandmother   . Heart disease Maternal Grandfather   . Hyperlipidemia Maternal Grandfather   . Hypertension Maternal Grandfather   . Diabetes Paternal Grandmother   . Stroke Paternal Grandmother   . Diabetes Paternal Grandfather   . Heart disease Paternal Grandfather   . Hyperlipidemia Paternal Grandfather   . Hypertension Paternal Grandfather    Social History   Socioeconomic History  . Marital status: Married    Spouse name: Not on file  . Number of children: Not on file  . Years of education: Not on file  . Highest education level: Not on file  Occupational History  . Not on file  Social Needs  . Financial resource strain: Not on file  . Food insecurity:    Worry: Not on file    Inability: Not on file  . Transportation needs:    Medical: Not on file    Non-medical: Not on file  Tobacco Use  . Smoking status: Never Smoker  . Smokeless tobacco: Never Used  Substance and Sexual Activity  . Alcohol use: No  . Drug use: No  . Sexual activity: Not on file  Lifestyle  . Physical activity:    Days per week: Not on file    Minutes per session: Not on file  . Stress: Not on file  Relationships  . Social connections:    Talks on phone: Not on file    Gets together: Not on file    Attends religious service: Not on file    Active member of club or organization: Not on file    Attends meetings of clubs or organizations: Not on file    Relationship status: Not on file  Other Topics Concern  . Not on file  Social History Narrative  . Not on file   Depression screen [facility] 2/9 02/02/2018 01/19/2018 11/18/2016 11/10/2016 08/22/2016  Decreased Interest 0 0 0 0 0    Down, Depressed, Hopeless 0 0 0 0 0  PHQ - 2 Score 0 0 0 0 0    ROS As noted in HPI  Objective:  BP 105/70   Pulse 83   Temp 98.4 F (36.9 C) (Oral)   Resp 16   Ht 5\' 6"  (1.676 m)   Wt 176 lb 6.4 oz (80 kg)   SpO2 97%   BMI 28.47 kg/m  Physical Exam Constitutional:      General: She is not in acute distress.    Appearance: She is well-developed. She is not ill-appearing or diaphoretic.  HENT:     Head: Normocephalic and atraumatic.     Right Ear: Ear canal and external ear normal. A middle ear effusion is present. Tympanic membrane is erythematous and retracted.     Left Ear: Ear canal and external ear normal. Tympanic membrane is erythematous and retracted.     Nose: Mucosal edema and rhinorrhea present.     Right Sinus: Maxillary sinus tenderness present.     Left Sinus: Maxillary sinus tenderness present.     Mouth/Throat:     Pharynx: Uvula midline. Posterior oropharyngeal erythema present. No oropharyngeal exudate.     Tonsils: No tonsillar abscesses.  Eyes:     General: No scleral icterus.       Right eye: No discharge.        Left eye: No discharge.     Conjunctiva/sclera: Conjunctivae normal.  Neck:     Musculoskeletal: Normal range of motion and neck supple.     Thyroid: No thyromegaly.  Cardiovascular:     Rate and Rhythm: Normal rate and regular rhythm.     Heart sounds: Normal heart sounds.  Pulmonary:     Effort: Pulmonary effort is normal. No respiratory distress.     Breath sounds: Normal breath sounds.  Lymphadenopathy:     Head:     Right side of head: Submandibular adenopathy present. No preauricular or posterior auricular adenopathy.     Left side of head: Submandibular adenopathy present. No preauricular or posterior auricular adenopathy.     Cervical: No cervical adenopathy.     Upper Body:     Right upper body: No supraclavicular adenopathy.     Left upper body: No supraclavicular adenopathy.  Skin:    General: Skin is warm and dry.      Findings: No erythema.  Neurological:     Mental Status: She is alert and oriented to person, place, and time.  Psychiatric:        Behavior: Behavior normal.     POC TESTING  Office Visit on 02/02/2018  Component Date Value Ref Range Status  . Cholesterol, Total 02/02/2018 261* 100 - 199 mg/dL Final  . Triglycerides 02/02/2018 223* 0 - 149 mg/dL Final  . HDL 02/02/2018 72  >39 mg/dL Final  . VLDL Cholesterol Cal 02/02/2018 45* 5 - 40 mg/dL Final  . LDL Calculated 02/02/2018 144* 0 - 99 mg/dL Final  . Chol/HDL Ratio 02/02/2018 3.6  0.0 - 4.4 ratio Final   Comment:                                   T. Chol/HDL Ratio                                             Men  Women                               1/2 Avg.Risk  3.4    3.3                                   Avg.Risk  5.0    4.4                                2X Avg.Risk  9.6    7.1                                3X Avg.Risk 23.4   11.0   . TSH 02/02/2018 1.270  0.450 - 4.500 uIU/mL Final  . T4, Total 02/02/2018 9.3  4.5 - 12.0 ug/dL Final  . T3 Uptake Ratio 02/02/2018 25  24 - 39 % Final  . Free Thyroxine Index 02/02/2018 2.3  1.2 - 4.9 Final  . Glucose 02/02/2018 96  65 - 99 mg/dL Final  . BUN 02/02/2018 12  6 - 24 mg/dL Final  . Creatinine, Ser 02/02/2018 0.93  0.57 - 1.00 mg/dL Final  . GFR calc non Af Amer 02/02/2018 73  >59 mL/min/1.73 Final  . GFR calc Af Amer 02/02/2018 84  >59 mL/min/1.73 Final  . BUN/Creatinine Ratio 02/02/2018 13  9 - 23 Final  . Sodium 02/02/2018 141  134 - 144 mmol/L Final  . Potassium 02/02/2018 4.1  3.5 - 5.2 mmol/L Final  . Chloride 02/02/2018 102  96 - 106 mmol/L Final  . CO2 02/02/2018 24  20 - 29 mmol/L Final  . Calcium 02/02/2018 9.4  8.7 - 10.2 mg/dL Final  . Total Protein 02/02/2018 7.2  6.0 - 8.5 g/dL Final  . Albumin 02/02/2018 4.6  3.5 - 5.5 g/dL Final  . Globulin, Total 02/02/2018 2.6  1.5 - 4.5 g/dL Final  . Albumin/Globulin Ratio 02/02/2018 1.8  1.2 - 2.2 Final  . Bilirubin Total  02/02/2018 0.3  0.0 - 1.2 mg/dL Final  . Alkaline Phosphatase 02/02/2018 119* 39 - 117 IU/L Final  . AST 02/02/2018 28  0 - 40 IU/L Final  . ALT 02/02/2018 53* 0 - 32 IU/L Final  . WBC 02/02/2018 6.6  4.6 - 10.2 K/uL Final  . Lymph, poc 02/02/2018 1.3  0.6 - 3.4 Final  . POC LYMPH PERCENT 02/02/2018 20.1  10 - 50 %L Final  . MID (cbc) 02/02/2018 0.5  0 - 0.9 Final  . POC MID % 02/02/2018 7.5  0 - 12 %M Final  . POC Granulocyte 02/02/2018 4.8  2 - 6.9 Final  . Granulocyte percent 02/02/2018 72.4  37 - 80 %G Final  . RBC 02/02/2018 4.95  4.04 - 5.48 M/uL Final  . Hemoglobin 02/02/2018 14.9* 11 - 14.6 g/dL Final  . HCT, POC 02/02/2018 43.5* 29 - 41 % Final  . MCV 02/02/2018 87.8  76 - 111 fL Final  . MCH, POC 02/02/2018 30.2  27 - 31.2 pg Final  . MCHC 02/02/2018 34.4  31.8 - 35.4 g/dL Final  . RDW, POC 02/02/2018 13.6  % Final  . Platelet Count, POC 02/02/2018 342  142 - 424 K/uL Final  . MPV 02/02/2018 6.5  0 - 99.8 fL Final  . Yeast by KOH 02/02/2018 Present* Absent Final  . Yeast by wet prep 02/02/2018 Present* Absent Final  . WBC by wet prep 02/02/2018 Few  Few Final  . Clue Cells Wet Prep HPF POC 02/02/2018 Few* None Final  . Trich by wet prep 02/02/2018 Absent  Absent Final  . Bacteria Wet Prep HPF POC 02/02/2018 Many* Few Final  . Epithelial Cells By [employer] ([facility]) 02/02/2018 Many* None, Few, Too numerous to count Final  . RBC,UR,HPF,POC 02/02/2018 None  None RBC/hpf Final  . Color, UA 02/02/2018 green* yellow Final  . Clarity, UA 02/02/2018 clear  clear Final  . Glucose, UA 02/02/2018 negative  negative mg/dL Final  . Bilirubin, UA 02/02/2018 small* negative Final  . Ketones, POC UA 02/02/2018 negative  negative mg/dL Final  . Spec Grav, UA 02/02/2018 1.025  1.010 - 1.025 Final  . Blood, UA 02/02/2018 negative  negative Final  . pH, UA 02/02/2018 5.5  5.0 - 8.0 Final  . Protein Ur, POC 02/02/2018 trace* negative mg/dL Final  . Urobilinogen, UA 02/02/2018 0.2  0.2 or 1.0  E.U./dL Final  . Nitrite, UA 02/02/2018 Negative  Negative Final  . Leukocytes, UA 02/02/2018 Negative  Negative Final  . HIV Screen 4th Generation wRfx 02/02/2018 Non Reactive  Non Reactive Final     Assessment & Plan:   1. Annual physical exam - [facility] pelvic and mammogram done annually in June at [facility] by Dr. [name]  2. Acquired hypothyroidism - thyroid panel perfect today - cont levothyroxine 100 mcg  3. Vaginal itching - self-collect wet prep  4. Vaginal moniliasis - pt emphatic that sxs never resolve w/ fluconazole x 1- always have to take x 3 - considering already has very + KOH, recent abx, and now starting 2 new abx will trx w/ fluconazole 150mg  q3d x 3 starting today to cover while on antibiotic, then after vaginal metrogel complete, take the last single dose fluconazole 150jmg to ensure doesn't recur  5. Bacterial vaginosis - sxs and mild finding on KOH but likely to worsen w/ abx treatment so AFTER levaquin is completed, THEN treat w/ metrogel vag supp qhs x 7d (after the completion of which pt will take last fluconazole 150mg .  4. Chronic pansinusitis - only able to take 2d of Augmentin due to severe allergic reaction with facial angioedema beginning on eyelids and rapidly swelling - was refused appt here repeatedly so resolved w benadryl and ice - had initial improvement in sinusitis sxs even with sev d but then  worsening so trx w/ levaquin.  5. Medication monitoring encounter   6. Allergic urticaria - cont cetirizine - gets much worse w/ anxiety so increase use of prn hydroxyzine as targets both, refer to derm to cons allergy testing  7. Nevus - reassured appear benign but will refer to derm for further eval - pt prefers to avoid biopsy if poss and have very experienced hand if needed as has sig problems w/ keloid formation w/ all prior biopsies - rec Dr. Wilhemina Bonito at [facility] derm  8. Generalized anxiety disorder - worsening - increase lexapro from 5 to 10 -  refilled prn lorazepam, encouraged increase use of prn hydroxyzine since anxiety triggers pruritis.  9. Screening for endocrine, nutritional, metabolic and immunity disorder   10. Elevated cholesterol   11. Routine screening for STI (sexually transmitted infection)   12. Screening for HIV (human immunodeficiency virus)     SEND IN YOUR LEVOTHYROXINE WHEN LABS COME BACK. [facility] IN June   Patient will continue on current chronic medications other than changes noted above, so ok to refill when needed.   See after visit summary for patient specific instructions.  Orders Placed This Encounter  Procedures  . Lipid panel  . Thyroid Panel With TSH  . Comprehensive metabolic panel  . HIV Antibody (routine testing w rflx)  . Ambulatory referral to Allergy    Referral Priority:   Routine    Referral Type:   Allergy Testing    Referral Reason:   Specialty Services Required    Requested Specialty:   Allergy    Number of Visits Requested:   1  . Ambulatory referral to Dermatology    Referral Priority:   Routine    Referral Type:   Consultation    Referral Reason:   Specialty Services Required    Referred to Provider:   Danella Sensing, MD    Requested Specialty:   Dermatology    Number of Visits Requested:   1  . POCT CBC  . POCT Wet + KOH Prep  . POCT urinalysis dipstick    Meds ordered this encounter  Medications  . methylPREDNISolone acetate (DEPO-MEDROL) injection 80 mg  . fluconazole (DIFLUCAN) 150 MG tablet    Sig: Start today and repeat every 3d x 3 doses and then take 4 dose AFTER metrogel is completed    Dispense:  4 tablet    Refill:  0  . levofloxacin (LEVAQUIN) 750 MG tablet    Sig: Take 1 tablet (750 mg total) by mouth daily.    Dispense:  5 tablet    Refill:  0  . metroNIDAZOLE (METROGEL) 0.75 % vaginal gel    Sig: Place 1 Applicatorful vaginally at bedtime. X7d, start after levofloxacin is complete    Dispense:  70 g    Refill:  0  . LORazepam (ATIVAN) 0.5  MG tablet    Sig: Take 1-2 tablets (0.5-1 mg total) by mouth at bedtime as needed for anxiety.    Dispense:  30 tablet    Refill:  2  . hydrOXYzine (ATARAX/VISTARIL) 25 MG tablet    Sig: Take 1 tablet (25 mg total) by mouth 3 (three) times daily as needed.    Dispense:  60 tablet    Refill:  4  . escitalopram (LEXAPRO) 10 MG tablet    Sig: Take 1 tablet (10 mg total) by mouth at bedtime.    Dispense:  90 tablet    Refill:  3  . cetirizine (ZYRTEC)  10 MG tablet    Sig: Take 1 tablet (10 mg total) by mouth at bedtime.    Dispense:  90 tablet    Refill:  3  . levothyroxine (SYNTHROID, LEVOTHROID) 100 MCG tablet    Sig: Take 1 tablet (100 mcg total) by mouth daily.    Dispense:  90 tablet    Refill:  3  . clindamycin (CLEOCIN T) 1 % lotion    Sig: Apply topically 2 (two) times daily.    Dispense:  60 mL    Refill:  5    Patient verbalized to me that they understand the following: diagnosis, what is being done for them, what to expect and what should be done at home.  Their questions have been answered. They understand that I am unable to predict every possible medication interaction or adverse outcome and that if any unexpected symptoms arise, they should contact us and their pharmacist, as well as never hesitate to seek urgent/emergent care at [facility] or ER if they think it might be warranted.    [name], MD, MPH [facility] at [facility] [address] [place], [place]  [address] [phone] Office phone  [phone] Office fax  02/02/18 9:27 AM

## 2018-02-02 NOTE — Patient Instructions (Signed)
° ° ° °  If you have lab work done today you will be contacted with your lab results within the next 2 weeks.  If you have not heard from us then please contact us. The fastest way to get your results is to register for My Chart. ° ° °IF you received an x-ray today, you will receive an invoice from [facility]. Please contact [facility] at [phone] with questions or concerns regarding your invoice.  ° °IF you received labwork today, you will receive an invoice from [facility]. Please contact [facility] at [phone] with questions or concerns regarding your invoice.  ° °Our billing staff will not be able to assist you with questions regarding bills from these companies. ° °You will be contacted with the lab results as soon as they are available. The fastest way to get your results is to activate your My Chart account. Instructions are located on the last page of this paperwork. If you have not heard from us regarding the results in 2 weeks, please contact this office. °  ° ° ° °

## 2018-02-03 LAB — COMPREHENSIVE METABOLIC PANEL
ALBUMIN: 4.6 g/dL (ref 3.5–5.5)
ALT: 53 IU/L — ABNORMAL HIGH (ref 0–32)
AST: 28 IU/L (ref 0–40)
Albumin/Globulin Ratio: 1.8 (ref 1.2–2.2)
Alkaline Phosphatase: 119 IU/L — ABNORMAL HIGH (ref 39–117)
BUN / CREAT RATIO: 13 (ref 9–23)
BUN: 12 mg/dL (ref 6–24)
Bilirubin Total: 0.3 mg/dL (ref 0.0–1.2)
CALCIUM: 9.4 mg/dL (ref 8.7–10.2)
CO2: 24 mmol/L (ref 20–29)
CREATININE: 0.93 mg/dL (ref 0.57–1.00)
Chloride: 102 mmol/L (ref 96–106)
GFR calc non Af Amer: 73 mL/min/{1.73_m2} (ref >59)
GFR, EST AFRICAN AMERICAN: 84 mL/min/{1.73_m2} (ref >59)
Globulin, Total: 2.6 g/dL (ref 1.5–4.5)
Glucose: 96 mg/dL (ref 65–99)
Potassium: 4.1 mmol/L (ref 3.5–5.2)
Sodium: 141 mmol/L (ref 134–144)
TOTAL PROTEIN: 7.2 g/dL (ref 6.0–8.5)

## 2018-02-03 LAB — LIPID PANEL
Chol/HDL Ratio: 3.6 ratio (ref 0.0–4.4)
Cholesterol, Total: 261 mg/dL — ABNORMAL HIGH (ref 100–199)
HDL: 72 mg/dL (ref >39)
LDL CALC: 144 mg/dL — AB (ref 0–99)
TRIGLYCERIDES: 223 mg/dL — AB (ref 0–149)
VLDL CHOLESTEROL CAL: 45 mg/dL — AB (ref 5–40)

## 2018-02-03 LAB — THYROID PANEL WITH TSH
Free Thyroxine Index: 2.3 (ref 1.2–4.9)
T3 Uptake Ratio: 25 % (ref 24–39)
T4, Total: 9.3 ug/dL (ref 4.5–12.0)
TSH: 1.27 u[IU]/mL (ref 0.450–4.500)

## 2018-02-03 LAB — HIV ANTIBODY (ROUTINE TESTING W REFLEX): HIV Screen 4th Generation wRfx: NONREACTIVE

## 2018-02-04 MED ORDER — LEVOTHYROXINE SODIUM 100 MCG PO TABS: 100.0000 ug | ORAL_TABLET | Freq: Every day | ORAL | 3 refills | Status: AC

## 2018-02-04 MED ORDER — CLINDAMYCIN PHOSPHATE 1 % EX LOTN: TOPICAL_LOTION | Freq: Two times a day (BID) | CUTANEOUS | 5 refills | Status: DC

## 2018-02-05 ENCOUNTER — Encounter: Payer: Self-pay | Admitting: *Deleted

## 2018-03-13 ENCOUNTER — Ambulatory Visit: Payer: BC Managed Care – PPO | Admitting: Allergy and Immunology

## 2018-03-13 ENCOUNTER — Encounter: Payer: Self-pay | Admitting: Allergy and Immunology

## 2018-03-13 VITALS — BP 112/66 | HR 80 | Temp 98.2°F | Resp 20 | Ht 67.0 in | Wt 178.2 lb

## 2018-03-13 DIAGNOSIS — L299 Pruritus, unspecified: Secondary | ICD-10-CM

## 2018-03-13 DIAGNOSIS — J3089 Other allergic rhinitis: Secondary | ICD-10-CM | POA: Diagnosis not present

## 2018-03-13 DIAGNOSIS — R7401 Elevation of levels of liver transaminase levels: Secondary | ICD-10-CM

## 2018-03-13 DIAGNOSIS — T63441D Toxic effect of venom of bees, accidental (unintentional), subsequent encounter: Secondary | ICD-10-CM

## 2018-03-13 DIAGNOSIS — G43909 Migraine, unspecified, not intractable, without status migrainosus: Secondary | ICD-10-CM

## 2018-03-13 DIAGNOSIS — R740 Nonspecific elevation of levels of transaminase and lactic acid dehydrogenase [LDH]: Secondary | ICD-10-CM

## 2018-03-13 DIAGNOSIS — T50905D Adverse effect of unspecified drugs, medicaments and biological substances, subsequent encounter: Secondary | ICD-10-CM

## 2018-03-13 MED ORDER — EPINEPHRINE 0.3 MG/0.3ML IJ SOAJ: INTRAMUSCULAR | 2 refills | Status: DC

## 2018-03-13 NOTE — Progress Notes (Signed)
Dear Dr. Brigitte Pulse,  Thank you for referring Ruffin Frederick to the [facility] on 03/13/2018.   Below is a summation of this patient's evaluation and recommendations.  Thank you for your referral. I will keep you informed about this patient's response to treatment.   If you have any questions please do not hesitate to contact me.   Sincerely,  Jiles Prows, MD Allergy / Immunology [facility]   ______________________________________________________________________    NEW PATIENT NOTE  Referring Provider: Shawnee Knapp, MD Primary Provider: Shawnee Knapp, MD Date of office visit: 03/13/2018    Subjective:   Chief Complaint:  [name] (DOB: 14-Jun-1970) is a 48 y.o. female who presents to the clinic on 03/13/2018 with a chief complaint of allergies. Marland Kitchen     HPI: Aliene presents to this clinic with a multitude of issues.  First, she has a long history of "sinus".  "Sinus", according to [facility], predominantly involves periorbital and facial pain.  It sounds as though she has this issue just about every day of the week.  Sometimes she will roll over into a migraine headache that is pounding and associated with nausea.  Recently this has been occurring just about every week.  She will take an Excedrin Migraine about twice a month to every week and she will use Aleve in between Excedrin at least 3 times per week not just for headache but sometimes for arthritis.  Second, in addition to her sinus headache she also appears to have intermittent issues with nasal congestion and runny nose and some sneezing and itchy eyes.  Apparently she was skin tested about 20 years ago and found to be allergic to various things that she cannot remember.  She does note that when she gets around cats it does bother her eyes.  She consistently uses Flonase which she thinks does help her upper airway issue.  She has  tried Singulair in the past which has not helped her.  Most recently she required the administration of amoxicillin at the beginning of December and levofloxacin at the end of December to treat this sinus issues.  As well, she received a steroid injection at the end of December.  Third, she has a history of being stung by yellow jackets when she was in her 4s.  She had 5 stings up near her hip and besides for large local reaction she also developed some throat swelling and throat tightness for which she took Benadryl.  Apparently she may have had evaluation for this issue a long time ago.  She does not carry an EpiPen at this point although she did in the past.  Fourth, she has had reactions to antibiotics.  She had a particularly bad reaction after taking amoxicillin clavulanic acid.  Her reaction occurred on 19 January 2018 at which point in time she developed a facial rash and periorbital swelling 5 days after starting antibiotic.  She terminated her antibiotic and a reaction lasted about 2 days past that point.  Fifth, she also has issues with adhesives.  She will develop a blistering rash with adhesive bandage use.  As well, she has developed tingling lips when blowing up balloons recently but never any swelling or other associated systemic or constitutional symptoms.  Sixth, she also has itchiness and if she gets very stressed with her anxiety she will develop hives on her chest.  Her itchiness  is every night and she must use hydroxyzine for this itchiness and in addition she uses hydroxyzine for sleep.  She does have a history of having elevated liver function tests.  Britiny consumes caffeine.  She has about 3 cups of coffee per day and eats chocolate about twice a week.  In addition, she does use Excedrin just about every week.  She drinks alcohol on most days of the week.  She usually has 1 or 2 drinks of vodka or gin upon returning home from work.  Past Medical History:  Diagnosis Date  .  Allergy   . Anxiety   . Depression   . Eczema   . Thyroid disease   . Urticaria     Past Surgical History:  Procedure Laterality Date  . CESAREAN SECTION    . CHOLECYSTECTOMY  2003    Allergies as of 03/13/2018      Reactions   Augmentin [amoxicillin-pot Clavulanate] Swelling   Pt has tolerated amoxicillin prior   Sulfa Antibiotics Shortness Of Breath   Doxycycline Calcium Swelling   Retained fluid   Latex Rash      Medication List      cetirizine 10 MG tablet Commonly known as:  ZYRTEC Take 1 tablet (10 mg total) by mouth at bedtime.   clindamycin 1 % lotion Commonly known as:  CLEOCIN T Apply topically 2 (two) times daily.   escitalopram 10 MG tablet Commonly known as:  LEXAPRO Take 1 tablet (10 mg total) by mouth at bedtime.   fluconazole 150 MG tablet Commonly known as:  DIFLUCAN Start today and repeat every 3d x 3 doses and then take 4 dose AFTER metrogel is completed   fluticasone 50 MCG/ACT nasal spray Commonly known as:  FLONASE Place 2 sprays into both nostrils daily.   hydrOXYzine 25 MG tablet Commonly known as:  ATARAX/VISTARIL Take 1 tablet (25 mg total) by mouth 3 (three) times daily as needed.   levofloxacin 750 MG tablet Commonly known as:  LEVAQUIN Take 1 tablet (750 mg total) by mouth daily.   levothyroxine 100 MCG tablet Commonly known as:  SYNTHROID, LEVOTHROID Take 1 tablet (100 mcg total) by mouth daily.   LORazepam 0.5 MG tablet Commonly known as:  ATIVAN Take 1-2 tablets (0.5-1 mg total) by mouth at bedtime as needed for anxiety.   metroNIDAZOLE 0.75 % vaginal gel Commonly known as:  METROGEL Place 1 Applicatorful vaginally at bedtime. X7d, start after levofloxacin is complete   URIBEL 118 MG Caps Take by mouth.       Review of systems negative except as noted in HPI / PMHx or noted below:  Review of Systems  Constitutional: Negative.   HENT: Negative.   Eyes: Negative.   Respiratory: Negative.   Cardiovascular:  Negative.   Gastrointestinal: Negative.   Genitourinary: Negative.   Musculoskeletal: Negative.   Skin: Negative.   Neurological: Negative.   Endo/Heme/Allergies: Negative.   Psychiatric/Behavioral: Negative.     Family History  Problem Relation Age of Onset  . Hypertension Father   . Hyperlipidemia Father   . Cancer Maternal Grandmother   . Diabetes Maternal Grandmother   . Hypertension Maternal Grandmother   . Heart disease Maternal Grandfather   . Hyperlipidemia Maternal Grandfather   . Hypertension Maternal Grandfather   . Diabetes Paternal Grandmother   . Stroke Paternal Grandmother   . Diabetes Paternal Grandfather   . Heart disease Paternal Grandfather   . Hyperlipidemia Paternal Grandfather   . Hypertension Paternal Grandfather  Social History   Socioeconomic History  . Marital status: Married    Spouse name: Not on file  . Number of children: Not on file  . Years of education: Not on file  . Highest education level: Not on file  Occupational History  . Not on file  Social Needs  . Financial resource strain: Not on file  . Food insecurity:    Worry: Not on file    Inability: Not on file  . Transportation needs:    Medical: Not on file    Non-medical: Not on file  Tobacco Use  . Smoking status: Never Smoker  . Smokeless tobacco: Never Used  Substance and Sexual Activity  . Alcohol use: No  . Drug use: No  . Sexual activity: Not on file  Lifestyle  . Physical activity:    Days per week: Not on file    Minutes per session: Not on file  . Stress: Not on file  Relationships  . Social connections:    Talks on phone: Not on file    Gets together: Not on file    Attends religious service: Not on file    Active member of club or organization: Not on file    Attends meetings of clubs or organizations: Not on file    Relationship status: Not on file  . Intimate partner violence:    Fear of current or ex partner: Not on file    Emotionally abused:  Not on file    Physically abused: Not on file    Forced sexual activity: Not on file  Other Topics Concern  . Not on file  Social History Narrative  . Not on file    Environmental and Social history  Lives in a house with a dry environment, a dog located inside the household, a cat located inside the household, carpet in the bedroom, plastic on the bed, plastic on the pillow, and no smoking ongoing with inside the household.  She is an Metallurgist.  Objective:   Vitals:   03/13/18 1442  BP: 112/66  Pulse: 80  Resp: 20  Temp: 98.2 F (36.8 C)  SpO2: 96%   Height: 5\' 7"  (170.2 cm) Weight: 178 lb 3.2 oz (80.8 kg)  Physical Exam Constitutional:      Appearance: She is not diaphoretic.  HENT:     Head: Normocephalic.     Right Ear: Tympanic membrane, ear canal and external ear normal.     Left Ear: Tympanic membrane, ear canal and external ear normal.     Nose: Nose normal. No mucosal edema or rhinorrhea.     Mouth/Throat:     Pharynx: Uvula midline. No oropharyngeal exudate.  Eyes:     Conjunctiva/sclera: Conjunctivae normal.  Neck:     Thyroid: No thyromegaly.     Trachea: Trachea normal. No tracheal tenderness or tracheal deviation.  Cardiovascular:     Rate and Rhythm: Normal rate and regular rhythm.     Heart sounds: Normal heart sounds, S1 normal and S2 normal. No murmur.  Pulmonary:     Effort: No respiratory distress.     Breath sounds: Normal breath sounds. No stridor. No wheezing or rales.  Lymphadenopathy:     Head:     Right side of head: No tonsillar adenopathy.     Left side of head: No tonsillar adenopathy.     Cervical: No cervical adenopathy.  Skin:    Findings: No erythema or rash.     Nails:  There is no clubbing.   Neurological:     Mental Status: She is alert.     Diagnostics: Allergy skin tests were performed. She demonstrated hypersensitivity against dog.  Results of blood tests obtained 02 February 2018 identified creatinine 0.93  mg/DL, AST 20 8U/L, ALT 50 3U/L, TSH 1.270 IU/mL, T4 9.3 UG/DL, WBC 6.6, hemoglobin 14.9.  Assessment and Plan:    1. Pruritic disorder   2. Elevated transaminase level   3. Perennial allergic rhinitis   4. Migraine syndrome   5. Systemic reaction to bee sting, accidental or unintentional, subsequent encounter   6. Adverse effect of drug, subsequent encounter     1.  Allergen avoidance measures  2.  Treat and prevent inflammation:   A.  Flonase 1-2 sprays each nostril daily  3.  Treat and prevent headaches:   A.  Slowly consolidate caffeine and chocolate consumption  B.  Minimize use of analgesic agents  4.  If needed:   A.  Nasal saline  B.  OTC antihistamine / hydroxyzine  C.  Auvi-Q 0.3, Benadryl, MD/ER evaluation for allergic reaction  5.  Blood -hymenoptera panel, antimitochondrial antibody, anti-smooth muscle/actin antibody, LKM antibody, ANA with reflex, hepatitis B/C screen, CBC with differential, TP  6.  Return to clinic in 4 weeks or earlier if problem  Ryn does appear to have some atopic disease and we will get her to perform allergen avoidance measures directed against her dog as best as possible.  She appears to have a chronic headache issue with migraine which is probably tied up with her caffeine and chocolate consumption and consistent use of analgesic agents.  She also appears to have a pruritic disorder and given the fact that she does have elevated liver function tests we will screen her for various forms of hepatitis.  We definitely need to document a negative antimitochondrial antibody given her pruritic disorder.  We will further evaluate her for her history of hymenoptera venom hypersensitivity with a hymenoptera panel.  I will see her back in this clinic in 4 weeks to assess her response to therapy and consider further evaluation and treatment based upon this response and the results of her diagnostic testing.   Jiles Prows, MD Allergy /  Immunology [facility]

## 2018-03-13 NOTE — Patient Instructions (Addendum)
  1.  Allergen avoidance measures  2.  Treat and prevent inflammation:   A.  Flonase 1-2 sprays each nostril daily  3.  Treat and prevent headaches:   A.  Slowly consolidate caffeine and chocolate consumption  B.  Minimize use of analgesic agents  4.  If needed:   A.  Nasal saline  B.  OTC antihistamine / hydroxyzine  C.  Auvi-Q 0.3, Benadryl, MD/ER evaluation for allergic reaction  5.  Blood -hymenoptera panel, antimitochondrial antibody, anti-smooth muscle/actin antibody, LKM antibody, ANA with reflex, hepatitis B/C screen, CBC with differential, TP  6.  Return to clinic in 4 weeks or earlier if problem

## 2018-03-14 ENCOUNTER — Encounter: Payer: Self-pay | Admitting: Allergy and Immunology

## 2018-03-14 NOTE — Addendum Note (Signed)
Addended by: Romeo Apple R on: 03/14/2018 11:45 AM   Modules accepted: Orders

## 2018-03-16 LAB — SPECIMEN STATUS REPORT

## 2018-03-16 LAB — THYROID PEROXIDASE ANTIBODY: Thyroperoxidase Ab SerPl-aCnc: 28 IU/mL (ref 0–34)

## 2018-03-19 LAB — HBV/HCV (PROFILE VIII)
HCV Ab: <0.1 s/co ratio (ref 0.0–0.9)
Hep B C IgM: NEGATIVE
Hep B Core Total Ab: NEGATIVE
Hep B E Ab: NEGATIVE
Hep B E Ag: NEGATIVE
Hep B Surface Ab, Qual: NONREACTIVE
Hepatitis B Surface Ag: NEGATIVE

## 2018-03-19 LAB — ALLERGEN HYMENOPTERA PANEL
Bumblebee: <0.1 kU/L
Honeybee IgE: <0.1 kU/L
Hornet, White Face, IgE: <0.1 kU/L
Hornet, Yellow, IgE: <0.1 kU/L
Paper Wasp IgE: <0.1 kU/L
Yellow Jacket, IgE: <0.1 kU/L

## 2018-03-19 LAB — ENA+DNA/DS+SJORGEN'S
ENA RNP Ab: 2.8 AI — ABNORMAL HIGH (ref 0.0–0.9)
ENA SM Ab Ser-aCnc: <0.2 AI (ref 0.0–0.9)
ENA SSB (LA) Ab: <0.2 AI (ref 0.0–0.9)
dsDNA Ab: 2 IU/mL (ref 0–9)

## 2018-03-19 LAB — CBC WITH DIFFERENTIAL/PLATELET
Basophils Absolute: 0 10*3/uL (ref 0.0–0.2)
Basos: 0 %
EOS (ABSOLUTE): 0.1 10*3/uL (ref 0.0–0.4)
Eos: 1 %
Hematocrit: 42.2 % (ref 34.0–46.6)
Hemoglobin: 14.4 g/dL (ref 11.1–15.9)
IMMATURE GRANULOCYTES: 0 %
Immature Grans (Abs): 0 10*3/uL (ref 0.0–0.1)
Lymphocytes Absolute: 1.8 10*3/uL (ref 0.7–3.1)
Lymphs: 18 %
MCH: 29.9 pg (ref 26.6–33.0)
MCHC: 34.1 g/dL (ref 31.5–35.7)
MCV: 88 fL (ref 79–97)
Monocytes Absolute: 0.6 10*3/uL (ref 0.1–0.9)
Monocytes: 6 %
NEUTROS PCT: 75 %
Neutrophils Absolute: 7.5 10*3/uL — ABNORMAL HIGH (ref 1.4–7.0)
Platelets: 346 10*3/uL (ref 150–450)
RBC: 4.81 x10E6/uL (ref 3.77–5.28)
RDW: 12.9 % (ref 11.7–15.4)
WBC: 10.1 10*3/uL (ref 3.4–10.8)

## 2018-03-19 LAB — MITOCHONDRIAL ANTIBODIES: Mitochondrial Ab: <20 Units (ref 0.0–20.0)

## 2018-03-19 LAB — ANTI-MICROSOMAL ANTIBODY LIVER / KIDNEY: LKM1 AB: 0.6 U (ref 0.0–20.0)

## 2018-03-19 LAB — ANTI-SMOOTH MUSCLE ANTIBODY, IGG: Smooth Muscle Ab: 12 Units (ref 0–19)

## 2018-03-19 LAB — ANA W/REFLEX: Anti Nuclear Antibody(ANA): POSITIVE — AB

## 2018-03-19 LAB — HCV COMMENT:

## 2018-04-18 ENCOUNTER — Encounter: Payer: Self-pay | Admitting: Family Medicine

## 2018-04-18 ENCOUNTER — Other Ambulatory Visit: Payer: Self-pay

## 2018-04-18 ENCOUNTER — Ambulatory Visit: Payer: BC Managed Care – PPO | Admitting: Family Medicine

## 2018-04-18 VITALS — BP 119/77 | HR 82 | Temp 98.6°F | Resp 16 | Ht 67.0 in | Wt 178.6 lb

## 2018-04-18 DIAGNOSIS — J029 Acute pharyngitis, unspecified: Secondary | ICD-10-CM | POA: Diagnosis not present

## 2018-04-18 DIAGNOSIS — B373 Candidiasis of vulva and vagina: Secondary | ICD-10-CM | POA: Diagnosis not present

## 2018-04-18 DIAGNOSIS — J040 Acute laryngitis: Secondary | ICD-10-CM

## 2018-04-18 DIAGNOSIS — J320 Chronic maxillary sinusitis: Secondary | ICD-10-CM | POA: Diagnosis not present

## 2018-04-18 DIAGNOSIS — J069 Acute upper respiratory infection, unspecified: Secondary | ICD-10-CM | POA: Diagnosis not present

## 2018-04-18 DIAGNOSIS — B3731 Acute candidiasis of vulva and vagina: Secondary | ICD-10-CM

## 2018-04-18 LAB — POCT RAPID STREP A (OFFICE): Rapid Strep A Screen: NEGATIVE

## 2018-04-18 MED ORDER — FLUCONAZOLE 150 MG PO TABS: 150.0000 mg | ORAL_TABLET | Freq: Once | ORAL | 0 refills | Status: AC

## 2018-04-18 MED ORDER — LEVOFLOXACIN 750 MG PO TABS: 750.0000 mg | ORAL_TABLET | Freq: Every day | ORAL | 0 refills | Status: DC

## 2018-04-18 NOTE — Progress Notes (Signed)
Subjective:    Patient ID: [name], female    DOB: [birth year], 48 y.o.   MRN: [id number]  HPI [name] is a 48 y.o. female Presents today for: Chief Complaint  Patient presents with  . Sore Throat    sorethroat,neck sore, lots of mucus took some mucinex yday, ear pain fatigue (teacher)   Sore throat past week.  Hoarseness started 2 days ago.   School Pharmacist, hospital. Trouble using voice. Stayed home today.  Tired.  Mucus started today - yellow mucus.  No fever.  Soreness in neck, ears hurt. Pressure in sinuses past week or two, discolored nasal d/c started today. Sinus pressure for few weeks, worse past couple of days - some headaches and face pain- cheeks and ear pain.  Had steroid shot and levaquin in past for similar sx's in past. Did not tolerate augmentin in December. See note 12/27.  Changed to levaquin 750mg  qd for 5 days. Treated with tessalon and flonase at that time as well.  Cough, chest congestion as well.   Sick contacts with cold symptoms at school.  Allergist - Dr. Neldon Mc, appt 03/13/18.  flonase nasal spray. otc nasal saline (once per day, sometimes more). Took vicks nyquil last night.    Patient Active Problem List   Diagnosis Date Noted  . Generalized anxiety disorder 02/02/2018  . 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 Acquired hypothyroidism 02/02/2018  . Elevated cholesterol 02/02/2018   Past Medical History:  Diagnosis Date  . Allergy   . Anxiety   . Depression   . Eczema   . Thyroid disease   . Urticaria    Past Surgical History:  Procedure Laterality Date  . CESAREAN SECTION    . CHOLECYSTECTOMY  2003   Allergies  Allergen Reactions  . Augmentin [Amoxicillin-Pot Clavulanate] Swelling    Pt has tolerated amoxicillin prior  . Sulfa Antibiotics Shortness Of Breath  . Doxycycline Calcium Swelling    Retained fluid  . Latex Rash   Prior to Admission medications   Medication Sig Start Date End Date Taking? Authorizing Provider  cetirizine  (ZYRTEC) 10 MG tablet Take 1 tablet (10 mg total) by mouth at bedtime. 02/02/18  Yes Shawnee Knapp, MD  clindamycin (CLEOCIN T) 1 % lotion Apply topically 2 (two) times daily. 02/04/18  Yes Shawnee Knapp, MD  EPINEPHrine (AUVI-Q) 0.3 mg/0.3 mL IJ SOAJ injection Use as directed for severe allergic reaction 03/13/18  Yes Kozlow, Donnamarie Poag, MD  escitalopram (LEXAPRO) 10 MG tablet Take 1 tablet (10 mg total) by mouth at bedtime. 02/02/18  Yes Shawnee Knapp, MD  fluticasone (FLONASE) 50 MCG/ACT nasal spray Place 2 sprays into both nostrils daily. 01/19/18  Yes Rutherford Guys, MD  hydrOXYzine (ATARAX/VISTARIL) 25 MG tablet Take 1 tablet (25 mg total) by mouth 3 (three) times daily as needed. 02/02/18  Yes Shawnee Knapp, MD  levothyroxine (SYNTHROID, LEVOTHROID) 100 MCG tablet Take 1 tablet (100 mcg total) by mouth daily. 02/04/18  Yes Shawnee Knapp, MD  LORazepam (ATIVAN) 0.5 MG tablet Take 1-2 tablets (0.5-1 mg total) by mouth at bedtime as needed for anxiety. 02/02/18  Yes Shawnee Knapp, MD  Meth-Hyo-M Barnett Hatter Phos-Ph Sal (URIBEL) 118 MG CAPS Take by mouth.   Yes [provider]   Social History   Socioeconomic History  . Marital status: Married    Spouse name: Not on file  . Number of children: Not on file  . Years of education: Not on file  .  Highest education level: Not on file  Occupational History  . Not on file  Social Needs  . Financial resource strain: Not on file  . Food insecurity:    Worry: Not on file    Inability: Not on file  . Transportation needs:    Medical: Not on file    Non-medical: Not on file  Tobacco Use  . Smoking status: Never Smoker  . Smokeless tobacco: Never Used  Substance and Sexual Activity  . Alcohol use: No  . Drug use: No  . Sexual activity: Not on file  Lifestyle  . Physical activity:    Days per week: Not on file    Minutes per session: Not on file  . Stress: Not on file  Relationships  . Social connections:    Talks on phone: Not on file    Gets  together: Not on file    Attends religious service: Not on file    Active member of club or organization: Not on file    Attends meetings of clubs or organizations: Not on file    Relationship status: Not on file  . Intimate partner violence:    Fear of current or ex partner: Not on file    Emotionally abused: Not on file    Physically abused: Not on file    Forced sexual activity: Not on file  Other Topics Concern  . Not on file  Social History Narrative  . Not on file    Review of Systems Per HPI.     Objective:   Physical Exam Vitals signs reviewed.  Constitutional:      General: She is not in acute distress.    Appearance: She is well-developed.  HENT:     Head: Normocephalic and atraumatic.     Right Ear: Hearing, tympanic membrane, ear canal and external ear normal.     Left Ear: Hearing, tympanic membrane, ear canal and external ear normal.     Nose:     Right Sinus: Maxillary sinus tenderness present. No frontal sinus tenderness.     Left Sinus: Maxillary sinus tenderness present. No frontal sinus tenderness.     Mouth/Throat:     Pharynx: No oropharyngeal exudate.  Eyes:     Conjunctiva/sclera: Conjunctivae normal.     Pupils: Pupils are equal, round, and reactive to light.  Cardiovascular:     Rate and Rhythm: Normal rate and regular rhythm.     Heart sounds: Normal heart sounds. No murmur.  Pulmonary:     Effort: Pulmonary effort is normal. No respiratory distress.     Breath sounds: Normal breath sounds. No wheezing or rhonchi.  Skin:    General: Skin is warm and dry.     Findings: No rash.  Neurological:     Mental Status: She is alert and oriented to person, place, and time.  Psychiatric:        Behavior: Behavior normal.    Vitals:   04/18/18 1730  BP: 119/77  Pulse: 82  Resp: 16  Temp: 98.6 F (37 C)  TempSrc: Oral  SpO2: 98%  Weight: 178 lb 9.6 oz (81 kg)  Height: 5\' 7"  (1.702 m)     Results for orders placed or performed in visit on  04/18/18  POCT rapid strep A  Result Value Ref Range   Rapid Strep A Screen Negative Negative      Assessment & Plan:    [name] is a 48 y.o. female Acute upper respiratory  infection  Sore throat - Plan: POCT rapid strep A  Maxillary sinusitis, unspecified chronicity - Plan: levofloxacin (LEVAQUIN) 750 MG tablet  Candida vaginitis - Plan: fluconazole (DIFLUCAN) 150 MG tablet  Laryngitis  Likely viral respiratory infection with secondary sickening, worsening sinus symptoms.  Possible repeat sinusitis.  Potential risks of antibiotics discussed including antibiotic after recent use.  RTC precautions given.  Tendinopathy precautions given.  -Start Levaquin 750 mg x 5 days for sinusitis.  Diflucan prescription given if needed with history of candidal vaginosis from prior antibiotics.  -Voice rest, fluids, saline nasal spray for symptomatic care and laryngitis  -RTC precautions.  Meds ordered this encounter  Medications  . levofloxacin (LEVAQUIN) 750 MG tablet    Sig: Take 1 tablet (750 mg total) by mouth daily.    Dispense:  5 tablet    Refill:  0  . fluconazole (DIFLUCAN) 150 MG tablet    Sig: Take 1 tablet (150 mg total) by mouth once for 1 dose.    Dispense:  1 tablet    Refill:  0   Patient Instructions     levaquin for sinus infection. Watch for new potential side effects as we discussed.  Saline nasal spray multiple times per day, drink plenty of fluids, voice rest as much as possible.   Return to the clinic or go to the nearest emergency room if any of your symptoms worsen or new symptoms occur.  Diflucan if needed.   Sinusitis, Adult Sinusitis is inflammation of your sinuses. Sinuses are hollow spaces in the bones around your face. Your sinuses are located:  Around your eyes.  In the middle of your forehead.  Behind your nose.  In your cheekbones. Mucus normally drains out of your sinuses. When your nasal tissues become inflamed or swollen, mucus  can become trapped or blocked. This allows bacteria, viruses, and fungi to grow, which leads to infection. Most infections of the sinuses are caused by a virus. Sinusitis can develop quickly. It can last for up to 4 weeks (acute) or for more than 12 weeks (chronic). Sinusitis often develops after a cold. What are the causes? This condition is caused by anything that creates swelling in the sinuses or stops mucus from draining. This includes:  Allergies.  Asthma.  Infection from bacteria or viruses.  Deformities or blockages in your nose or sinuses.  Abnormal growths in the nose (nasal polyps).  Pollutants, such as chemicals or irritants in the air.  Infection from fungi (rare). What increases the risk? You are more likely to develop this condition if you:  Have a weak body defense system (immune system).  Do a lot of swimming or diving.  Overuse nasal sprays.  Smoke. What are the signs or symptoms? The main symptoms of this condition are pain and a feeling of pressure around the affected sinuses. Other symptoms include:  Stuffy nose or congestion.  Thick drainage from your nose.  Swelling and warmth over the affected sinuses.  Headache.  Upper toothache.  A cough that may get worse at night.  Extra mucus that collects in the throat or the back of the nose (postnasal drip).  Decreased sense of smell and taste.  Fatigue.  A fever.  Sore throat.  Bad breath. How is this diagnosed? This condition is diagnosed based on:  Your symptoms.  Your medical history.  A physical exam.  Tests to find out if your condition is acute or chronic. This may include: ? Checking your nose for nasal polyps. ?  Viewing your sinuses using a device that has a light (endoscope). ? Testing for allergies or bacteria. ? Imaging tests, such as an MRI or CT scan. In rare cases, a bone biopsy may be done to rule out more serious types of fungal sinus disease. How is this treated?  Treatment for sinusitis depends on the cause and whether your condition is chronic or acute.  If caused by a virus, your symptoms should go away on their own within 10 days. You may be given medicines to relieve symptoms. They include: ? Medicines that shrink swollen nasal passages (topical intranasal decongestants). ? Medicines that treat allergies (antihistamines). ? A spray that eases inflammation of the nostrils (topical intranasal corticosteroids). ? Rinses that help get rid of thick mucus in your nose (nasal saline washes).  If caused by bacteria, your health care provider may recommend waiting to see if your symptoms improve. Most bacterial infections will get better without antibiotic medicine. You may be given antibiotics if you have: ? A severe infection. ? A weak immune system.  If caused by narrow nasal passages or nasal polyps, you may need to have surgery. Follow these instructions at home: Medicines  Take, use, or apply over-the-counter and prescription medicines only as told by your health care provider. These may include nasal sprays.  If you were prescribed an antibiotic medicine, take it as told by your health care provider. Do not stop taking the antibiotic even if you start to feel better. Hydrate and humidify   Drink enough fluid to keep your urine pale yellow. Staying hydrated will help to thin your mucus.  Use a cool mist humidifier to keep the humidity level in your home above 50%.  Inhale steam for 10-15 minutes, 3-4 times a day, or as told by your health care provider. You can do this in the bathroom while a hot shower is running.  Limit your exposure to cool or dry air. Rest  Rest as much as possible.  Sleep with your head raised (elevated).  Make sure you get enough sleep each night. General instructions   Apply a warm, moist washcloth to your face 3-4 times a day or as told by your health care provider. This will help with discomfort.  Wash your  hands often with soap and water to reduce your exposure to germs. If soap and water are not available, use hand sanitizer.  Do not smoke. Avoid being around people who are smoking (secondhand smoke).  Keep all follow-up visits as told by your health care provider. This is important. Contact a health care provider if:  You have a fever.  Your symptoms get worse.  Your symptoms do not improve within 10 days. Get help right away if:  You have a severe headache.  You have persistent vomiting.  You have severe pain or swelling around your face or eyes.  You have vision problems.  You develop confusion.  Your neck is stiff.  You have trouble breathing. Summary  Sinusitis is soreness and inflammation of your sinuses. Sinuses are hollow spaces in the bones around your face.  This condition is caused by nasal tissues that become inflamed or swollen. The swelling traps or blocks the flow of mucus. This allows bacteria, viruses, and fungi to grow, which leads to infection.  If you were prescribed an antibiotic medicine, take it as told by your health care provider. Do not stop taking the antibiotic even if you start to feel better.  Keep all follow-up visits as  told by your health care provider. This is important. This information is not intended to replace advice given to you by your health care provider. Make sure you discuss any questions you have with your health care provider. Document Released: 01/24/2005 Document Revised: 06/26/2017 Document Reviewed: 06/26/2017 Elsevier Interactive Patient Education  2019 [employer].   Upper Respiratory Infection, Adult An upper respiratory infection (URI) affects the nose, throat, and upper air passages. URIs are caused by germs (viruses). The most common type of URI is often called "the common cold." Medicines cannot cure URIs, but you can do things at home to relieve your symptoms. URIs usually get better within 7-10 days. Follow these  instructions at home: Activity  Rest as needed.  If you have a fever, stay home from work or school until your fever is gone, or until your doctor says you may return to work or school. ? You should stay home until you cannot spread the infection anymore (you are not contagious). ? Your doctor may have you wear a face mask so you have less risk of spreading the infection. Relieving symptoms  Gargle with a salt-water mixture 3-4 times a day or as needed. To make a salt-water mixture, completely dissolve -1 tsp of salt in 1 cup of warm water.  Use a cool-mist humidifier to add moisture to the air. This can help you breathe more easily. Eating and drinking   Drink enough fluid to keep your pee (urine) pale yellow.  Eat soups and other clear broths. General instructions   Take over-the-counter and prescription medicines only as told by your doctor. These include cold medicines, fever reducers, and cough suppressants.  Do not use any products that contain nicotine or tobacco. These include cigarettes and e-cigarettes. If you need help quitting, ask your doctor.  Avoid being where people are smoking (avoid secondhand smoke).  Make sure you get regular shots and get the flu shot every year.  Keep all follow-up visits as told by your doctor. This is important. How to avoid spreading infection to others   Wash your hands often with soap and water. If you do not have soap and water, use hand sanitizer.  Avoid touching your mouth, face, eyes, or nose.  Cough or sneeze into a tissue or your sleeve or elbow. Do not cough or sneeze into your hand or into the air. Contact a doctor if:  You are getting worse, not better.  You have any of these: ? A fever. ? Chills. ? Brown or red mucus in your nose. ? Yellow or brown fluid (discharge)coming from your nose. ? Pain in your face, especially when you bend forward. ? Swollen neck glands. ? Pain with swallowing. ? White areas in the  back of your throat. Get help right away if:  You have shortness of breath that gets worse.  You have very bad or constant: ? Headache. ? Ear pain. ? Pain in your forehead, behind your eyes, and over your cheekbones (sinus pain). ? Chest pain.  You have long-lasting (chronic) lung disease along with any of these: ? Wheezing. ? Long-lasting cough. ? Coughing up blood. ? A change in your usual mucus.  You have a stiff neck.  You have changes in your: ? Vision. ? Hearing. ? Thinking. ? Mood. Summary  An upper respiratory infection (URI) is caused by a germ called a virus. The most common type of URI is often called "the common cold."  URIs usually get better within 7-10 days.  Take over-the-counter and prescription medicines only as told by your doctor. This information is not intended to replace advice given to you by your health care provider. Make sure you discuss any questions you have with your health care provider. Document Released: 07/13/2007 Document Revised: 09/16/2016 Document Reviewed: 09/16/2016 Elsevier Interactive Patient Education  [employer].   If you have lab work done today you will be contacted with your lab results within the next 2 weeks.  If you have not heard from [place] then please contact us. The fastest way to get your results is to register for My Chart.   IF you received an x-ray today, you will receive an invoice from [facility]. Please contact [facility] at [phone] with questions or concerns regarding your invoice.   IF you received labwork today, you will receive an invoice from [place]. Please contact [facility] at [phone] with questions or concerns regarding your invoice.   Our billing staff will not be able to assist you with questions regarding bills from these companies.  You will be contacted with the lab results as soon as they are available. The fastest way to get your results is to activate your My  Chart account. Instructions are located on the last page of this paperwork. If you have not heard from [place] regarding the results in 2 weeks, please contact this office.       Signed,   Merri Ray, MD [facility].  04/21/18 10:46 PM

## 2018-04-18 NOTE — Patient Instructions (Addendum)
levaquin for sinus infection. Watch for new potential side effects as we discussed.  Saline nasal spray multiple times per day, drink plenty of fluids, voice rest as much as possible.   Return to the clinic or go to the nearest emergency room if any of your symptoms worsen or new symptoms occur.  Diflucan if needed.   Sinusitis, Adult Sinusitis is inflammation of your sinuses. Sinuses are hollow spaces in the bones around your face. Your sinuses are located:  Around your eyes.  In the middle of your forehead.  Behind your nose.  In your cheekbones. Mucus normally drains out of your sinuses. When your nasal tissues become inflamed or swollen, mucus can become trapped or blocked. This allows bacteria, viruses, and fungi to grow, which leads to infection. Most infections of the sinuses are caused by a virus. Sinusitis can develop quickly. It can last for up to 4 weeks (acute) or for more than 12 weeks (chronic). Sinusitis often develops after a cold. What are the causes? This condition is caused by anything that creates swelling in the sinuses or stops mucus from draining. This includes:  Allergies.  Asthma.  Infection from bacteria or viruses.  Deformities or blockages in your nose or sinuses.  Abnormal growths in the nose (nasal polyps).  Pollutants, such as chemicals or irritants in the air.  Infection from fungi (rare). What increases the risk? You are more likely to develop this condition if you:  Have a weak body defense system (immune system).  Do a lot of swimming or diving.  Overuse nasal sprays.  Smoke. What are the signs or symptoms? The main symptoms of this condition are pain and a feeling of pressure around the affected sinuses. Other symptoms include:  Stuffy nose or congestion.  Thick drainage from your nose.  Swelling and warmth over the affected sinuses.  Headache.  Upper toothache.  A cough that may get worse at night.  Extra mucus that  collects in the throat or the back of the nose (postnasal drip).  Decreased sense of smell and taste.  Fatigue.  A fever.  Sore throat.  Bad breath. How is this diagnosed? This condition is diagnosed based on:  Your symptoms.  Your medical history.  A physical exam.  Tests to find out if your condition is acute or chronic. This may include: ? Checking your nose for nasal polyps. ? Viewing your sinuses using a device that has a light (endoscope). ? Testing for allergies or bacteria. ? Imaging tests, such as an MRI or CT scan. In rare cases, a bone biopsy may be done to rule out more serious types of fungal sinus disease. How is this treated? Treatment for sinusitis depends on the cause and whether your condition is chronic or acute.  If caused by a virus, your symptoms should go away on their own within 10 days. You may be given medicines to relieve symptoms. They include: ? Medicines that shrink swollen nasal passages (topical intranasal decongestants). ? Medicines that treat allergies (antihistamines). ? A spray that eases inflammation of the nostrils (topical intranasal corticosteroids). ? Rinses that help get rid of thick mucus in your nose (nasal saline washes).  If caused by bacteria, your health care provider may recommend waiting to see if your symptoms improve. Most bacterial infections will get better without antibiotic medicine. You may be given antibiotics if you have: ? A severe infection. ? A weak immune system.  If caused by narrow nasal passages or nasal polyps,  you may need to have surgery. Follow these instructions at home: Medicines  Take, use, or apply over-the-counter and prescription medicines only as told by your health care provider. These may include nasal sprays.  If you were prescribed an antibiotic medicine, take it as told by your health care provider. Do not stop taking the antibiotic even if you start to feel better. Hydrate and  humidify   Drink enough fluid to keep your urine pale yellow. Staying hydrated will help to thin your mucus.  Use a cool mist humidifier to keep the humidity level in your home above 50%.  Inhale steam for 10-15 minutes, 3-4 times a day, or as told by your health care provider. You can do this in the bathroom while a hot shower is running.  Limit your exposure to cool or dry air. Rest  Rest as much as possible.  Sleep with your head raised (elevated).  Make sure you get enough sleep each night. General instructions   Apply a warm, moist washcloth to your face 3-4 times a day or as told by your health care provider. This will help with discomfort.  Wash your hands often with soap and water to reduce your exposure to germs. If soap and water are not available, use hand sanitizer.  Do not smoke. Avoid being around people who are smoking (secondhand smoke).  Keep all follow-up visits as told by your health care provider. This is important. Contact a health care provider if:  You have a fever.  Your symptoms get worse.  Your symptoms do not improve within 10 days. Get help right away if:  You have a severe headache.  You have persistent vomiting.  You have severe pain or swelling around your face or eyes.  You have vision problems.  You develop confusion.  Your neck is stiff.  You have trouble breathing. Summary  Sinusitis is soreness and inflammation of your sinuses. Sinuses are hollow spaces in the bones around your face.  This condition is caused by nasal tissues that become inflamed or swollen. The swelling traps or blocks the flow of mucus. This allows bacteria, viruses, and fungi to grow, which leads to infection.  If you were prescribed an antibiotic medicine, take it as told by your health care provider. Do not stop taking the antibiotic even if you start to feel better.  Keep all follow-up visits as told by your health care provider. This is  important. This information is not intended to replace advice given to you by your health care provider. Make sure you discuss any questions you have with your health care provider. Document Released: 01/24/2005 Document Revised: 06/26/2017 Document Reviewed: 06/26/2017 Elsevier Interactive Patient Education  2019 [employer].   Upper Respiratory Infection, Adult An upper respiratory infection (URI) affects the nose, throat, and upper air passages. URIs are caused by germs (viruses). The most common type of URI is often called "the common cold." Medicines cannot cure URIs, but you can do things at home to relieve your symptoms. URIs usually get better within 7-10 days. Follow these instructions at home: Activity  Rest as needed.  If you have a fever, stay home from work or school until your fever is gone, or until your doctor says you may return to work or school. ? You should stay home until you cannot spread the infection anymore (you are not contagious). ? Your doctor may have you wear a face mask so you have less risk of spreading the infection. Relieving  symptoms  Gargle with a salt-water mixture 3-4 times a day or as needed. To make a salt-water mixture, completely dissolve -1 tsp of salt in 1 cup of warm water.  Use a cool-mist humidifier to add moisture to the air. This can help you breathe more easily. Eating and drinking   Drink enough fluid to keep your pee (urine) pale yellow.  Eat soups and other clear broths. General instructions   Take over-the-counter and prescription medicines only as told by your doctor. These include cold medicines, fever reducers, and cough suppressants.  Do not use any products that contain nicotine or tobacco. These include cigarettes and e-cigarettes. If you need help quitting, ask your doctor.  Avoid being where people are smoking (avoid secondhand smoke).  Make sure you get regular shots and get the flu shot every year.  Keep all  follow-up visits as told by your doctor. This is important. How to avoid spreading infection to others   Wash your hands often with soap and water. If you do not have soap and water, use hand sanitizer.  Avoid touching your mouth, face, eyes, or nose.  Cough or sneeze into a tissue or your sleeve or elbow. Do not cough or sneeze into your hand or into the air. Contact a doctor if:  You are getting worse, not better.  You have any of these: ? A fever. ? Chills. ? Brown or red mucus in your nose. ? Yellow or brown fluid (discharge)coming from your nose. ? Pain in your face, especially when you bend forward. ? Swollen neck glands. ? Pain with swallowing. ? White areas in the back of your throat. Get help right away if:  You have shortness of breath that gets worse.  You have very bad or constant: ? Headache. ? Ear pain. ? Pain in your forehead, behind your eyes, and over your cheekbones (sinus pain). ? Chest pain.  You have long-lasting (chronic) lung disease along with any of these: ? Wheezing. ? Long-lasting cough. ? Coughing up blood. ? A change in your usual mucus.  You have a stiff neck.  You have changes in your: ? Vision. ? Hearing. ? Thinking. ? Mood. Summary  An upper respiratory infection (URI) is caused by a germ called a virus. The most common type of URI is often called "the common cold."  URIs usually get better within 7-10 days.  Take over-the-counter and prescription medicines only as told by your doctor. This information is not intended to replace advice given to you by your health care provider. Make sure you discuss any questions you have with your health care provider. Document Released: 07/13/2007 Document Revised: 09/16/2016 Document Reviewed: 09/16/2016 Elsevier Interactive Patient Education  [employer].   If you have lab work done today you will be contacted with your lab results within the next 2 weeks.  If you have not heard from  [place] then please contact us. The fastest way to get your results is to register for My Chart.   IF you received an x-ray today, you will receive an invoice from [facility]. Please contact [facility] at [phone] with questions or concerns regarding your invoice.   IF you received labwork today, you will receive an invoice from [place]. Please contact [facility] at [phone] with questions or concerns regarding your invoice.   Our billing staff will not be able to assist you with questions regarding bills from these companies.  You will be contacted with the lab results as  soon as they are available. The fastest way to get your results is to activate your My Chart account. Instructions are located on the last page of this paperwork. If you have not heard from [place] regarding the results in 2 weeks, please contact this office.

## 2018-04-21 ENCOUNTER — Encounter: Payer: Self-pay | Admitting: Family Medicine

## 2018-04-24 ENCOUNTER — Encounter: Payer: Self-pay | Admitting: Allergy and Immunology

## 2018-04-24 ENCOUNTER — Ambulatory Visit: Payer: BC Managed Care – PPO | Admitting: Allergy and Immunology

## 2018-04-24 ENCOUNTER — Other Ambulatory Visit: Payer: Self-pay

## 2018-04-24 VITALS — BP 138/68 | HR 60 | Temp 98.6°F | Resp 16 | Ht 67.0 in | Wt 177.2 lb

## 2018-04-24 DIAGNOSIS — L299 Pruritus, unspecified: Secondary | ICD-10-CM

## 2018-04-24 DIAGNOSIS — T63441D Toxic effect of venom of bees, accidental (unintentional), subsequent encounter: Secondary | ICD-10-CM | POA: Diagnosis not present

## 2018-04-24 DIAGNOSIS — G43909 Migraine, unspecified, not intractable, without status migrainosus: Secondary | ICD-10-CM

## 2018-04-24 DIAGNOSIS — R768 Other specified abnormal immunological findings in serum: Secondary | ICD-10-CM

## 2018-04-24 DIAGNOSIS — J3089 Other allergic rhinitis: Secondary | ICD-10-CM | POA: Diagnosis not present

## 2018-04-24 MED ORDER — FLUTICASONE PROPIONATE 50 MCG/ACT NA SUSP: 2.0000 | Freq: Every day | NASAL | 6 refills | Status: DC

## 2018-04-24 NOTE — Progress Notes (Signed)
[place]   Follow-up Note  Referring Provider: Shawnee Knapp, MD Primary Provider: Shawnee Knapp, MD Date of Office Visit: 04/24/2018  Subjective:   [name] (DOB: May 29, 1970) is a 48 y.o. female who returns to the [facility] [place] on 04/24/2018 in re-evaluation of the following:  HPI: [place]ah returns to this clinic in evaluation of several distinct issues addressed during her initial evaluation of 13 March 2018.  She has dramatically consolidated her caffeine consumption and she has only had 1 headache since being seen in this clinic.  This is down from basically having a headache almost every day.  She has only used 1 Excedrin.  She has not been having any issues with pruritus.  She has been doing relatively well with her upper airways but unfortunately 1 week ago developed acute onset of sore throat and complete laryngitis with some rash on her neck and left ear pain and left sinus pain and was seen at [facility] urgent care and treated with Levaquin.  She is much better today but she still appears to have some irritation in her throat slightly.  Her laryngitis has resolved.  She does not have any ugly nasal discharge at this point or fever although she still has a little bit of lingering cough in the morning.  She has been washing her dog every week.  During her last evaluation we did check an ANA and it came back positive for RNP.  She does not appear to have any cutaneous, joint, or other systemic signs or symptoms to suggest active connective tissue disease at this point.  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  Allergies as of 04/24/2018      Reactions   Augmentin [amoxicillin-pot Clavulanate] Swelling   Pt has tolerated amoxicillin prior   Sulfa Antibiotics Shortness Of Breath   Doxycycline Calcium Swelling   Retained fluid   Latex Rash       Medication List      cetirizine 10 MG tablet Commonly known as:  ZYRTEC Take 1 tablet (10 mg total) by mouth at bedtime.   clindamycin 1 % lotion Commonly known as:  CLEOCIN T Apply topically 2 (two) times daily.   EPINEPHrine 0.3 mg/0.3 mL Soaj injection Commonly known as:  Auvi-Q Use as directed for severe allergic reaction   escitalopram 10 MG tablet Commonly known as:  LEXAPRO Take 1 tablet (10 mg total) by mouth at bedtime.   fluticasone 50 MCG/ACT nasal spray Commonly known as:  FLONASE Place 2 sprays into both nostrils daily.   hydrOXYzine 25 MG tablet Commonly known as:  ATARAX/VISTARIL Take 1 tablet (25 mg total) by mouth 3 (three) times daily as needed.   levofloxacin 750 MG tablet Commonly known as:  Levaquin Take 1 tablet (750 mg total) by mouth daily.   levothyroxine 100 MCG tablet Commonly known as:  SYNTHROID, LEVOTHROID Take 1 tablet (100 mcg total) by mouth daily.   LORazepam 0.5 MG tablet Commonly known as:  ATIVAN Take 1-2 tablets (0.5-1 mg total) by mouth at bedtime as needed for anxiety.   Uribel 118 MG Caps Take by mouth.       Past Medical History:  Diagnosis Date  . Allergy   . Anxiety   . Depression   . Eczema   . Thyroid disease   . Urticaria     Past  Surgical History:  Procedure Laterality Date  . CESAREAN SECTION    . CHOLECYSTECTOMY  2003    Review of systems negative except as noted in HPI / PMHx or noted below:  Review of Systems  Constitutional: Negative.   HENT: Negative.   Eyes: Negative.   Respiratory: Negative.   Cardiovascular: Negative.   Gastrointestinal: Negative.   Genitourinary: Negative.   Musculoskeletal: Negative.   Skin: Negative.   Neurological: Negative.   Endo/Heme/Allergies: Negative.   Psychiatric/Behavioral: Negative.      Objective:   Vitals:   04/24/18 1555  BP: 138/68  Pulse: 60  Resp: 16  Temp: 98.6 F (37 C)  SpO2: 96%   Height: 5\' 7"  (170.2 cm)  Weight: 177 lb  3.2 oz (80.4 kg)   Physical Exam Constitutional:      Appearance: She is not diaphoretic.  HENT:     Head: Normocephalic.     Right Ear: Tympanic membrane, ear canal and external ear normal.     Left Ear: Tympanic membrane, ear canal and external ear normal.     Nose: Nose normal. No mucosal edema or rhinorrhea.     Mouth/Throat:     Pharynx: Uvula midline. No oropharyngeal exudate.  Eyes:     Conjunctiva/sclera: Conjunctivae normal.  Neck:     Thyroid: No thyromegaly.     Trachea: Trachea normal. No tracheal tenderness or tracheal deviation.  Cardiovascular:     Rate and Rhythm: Normal rate and regular rhythm.     Heart sounds: Normal heart sounds, S1 normal and S2 normal. No murmur.  Pulmonary:     Effort: No respiratory distress.     Breath sounds: Normal breath sounds. No stridor. No wheezing or rales.  Lymphadenopathy:     Head:     Right side of head: No tonsillar adenopathy.     Left side of head: No tonsillar adenopathy.     Cervical: No cervical adenopathy.  Skin:    Findings: No erythema or rash.     Nails: There is no clubbing.   Neurological:     Mental Status: She is alert.     Diagnostics:   Results of blood tests obtained for February 2020 identified WBC 10.1, absolute eosinophil 100, absolute lymphocyte 1800, hemoglobin 14.4, platelet 346, thyroid peroxidase antibody 28 IU/mL, positive ANA with 2.8 AI/L ENA RNP, LK M1 antibody 0.6U/L, dsDNA 2 IU/mL, negative smooth muscle antibody, negative antimitochondrial antibody, negative hepatitis C antibodies, negative hepatitis B antibodies or antigen, negative hymenoptera panel.  Results of urine analysis obtained 02 February 2018 identified trace protein, no RBC.  Assessment and Plan:   1. Pruritic disorder   2. Perennial allergic rhinitis   3. Migraine syndrome   4. Systemic reaction to bee sting, accidental or unintentional, subsequent encounter   5. Positive sm/RNP antibody     1.  Continue to perform  Allergen avoidance measures - dog  2.  Continue to Treat and prevent inflammation:   A.  Flonase 1-2 sprays each nostril daily  3.  Continue to Treat and prevent headaches:   A.  Slowly consolidate caffeine and chocolate consumption  B.  Minimize use of analgesic agents  4.  If needed:   A.  Nasal saline  B.  OTC antihistamine / hydroxyzine  C.  Auvi-Q 0.3, Benadryl, MD/ER evaluation for allergic reaction  5.  Return to clinic in 6 months or earlier if problem  Catie appears to be doing quite well on her current therapy and  there is definite improvement regarding her upper airway issues and her headaches and her pruritus.  At this point we will continue to have her utilize the plan mentioned above and see her back in this clinic in 6 months.  Concerning her positive RNP, there does not appear to be any systemic or constitutional symptoms to suggest that she has a connective tissue disease at this point.  We will need to see how things move forward over the course of the next year regarding any need for further evaluation of a connective tissue disease associated with a positive RNP.  She appears to have a good understanding of this issue and will contact us should she develop unusual systemic or constitutional symptoms in the future.  I do not think that her recent episode of respiratory tract infection, most likely viral in etiology, warrants any further evaluation or treatment and I suspect within a week or so she will resolve this issue completely.  She can contact me should resolution not come within the next week or so.  Allena Katz, MD Allergy / Immunology [place]

## 2018-04-24 NOTE — Patient Instructions (Addendum)
  1.  Continue to perform Allergen avoidance measures - dog  2.  Continue to Treat and prevent inflammation:   A.  Flonase 1-2 sprays each nostril daily  3.  Continue to Treat and prevent headaches:   A.  Slowly consolidate caffeine and chocolate consumption  B.  Minimize use of analgesic agents  4.  If needed:   A.  Nasal saline  B.  OTC antihistamine / hydroxyzine  C.  Auvi-Q 0.3, Benadryl, MD/ER evaluation for allergic reaction  5.  Return to clinic in 6 months or earlier if problem

## 2018-04-25 ENCOUNTER — Encounter: Payer: Self-pay | Admitting: Allergy and Immunology

## 2018-07-09 ENCOUNTER — Other Ambulatory Visit: Payer: Self-pay | Admitting: Obstetrics and Gynecology

## 2018-07-09 DIAGNOSIS — R5381 Other malaise: Secondary | ICD-10-CM

## 2018-07-11 ENCOUNTER — Other Ambulatory Visit: Payer: Self-pay | Admitting: Obstetrics and Gynecology

## 2018-07-11 DIAGNOSIS — N6310 Unspecified lump in the right breast, unspecified quadrant: Secondary | ICD-10-CM

## 2018-07-12 ENCOUNTER — Ambulatory Visit
Admission: RE | Admit: 2018-07-12 | Discharge: 2018-07-12 | Disposition: A | Discharge status: Home / Self Care | Payer: BC Managed Care – PPO | Source: Ambulatory Visit | Attending: Obstetrics and Gynecology | Admitting: Obstetrics and Gynecology

## 2018-07-12 ENCOUNTER — Other Ambulatory Visit: Payer: Self-pay

## 2018-07-12 DIAGNOSIS — N6310 Unspecified lump in the right breast, unspecified quadrant: Secondary | ICD-10-CM

## 2018-08-02 ENCOUNTER — Other Ambulatory Visit: Payer: Self-pay | Admitting: Obstetrics and Gynecology

## 2018-08-02 DIAGNOSIS — N644 Mastodynia: Secondary | ICD-10-CM

## 2018-08-06 ENCOUNTER — Other Ambulatory Visit: Payer: Self-pay | Admitting: Obstetrics and Gynecology

## 2018-08-06 DIAGNOSIS — N644 Mastodynia: Secondary | ICD-10-CM

## 2018-08-15 ENCOUNTER — Other Ambulatory Visit: Payer: BC Managed Care – PPO

## 2018-08-16 ENCOUNTER — Ambulatory Visit
Admission: RE | Admit: 2018-08-16 | Discharge: 2018-08-16 | Disposition: A | Discharge status: Home / Self Care | Payer: BC Managed Care – PPO | Source: Ambulatory Visit | Attending: Obstetrics and Gynecology | Admitting: Obstetrics and Gynecology

## 2018-08-16 ENCOUNTER — Ambulatory Visit: Payer: BC Managed Care – PPO

## 2018-08-16 DIAGNOSIS — N644 Mastodynia: Secondary | ICD-10-CM

## 2018-08-17 ENCOUNTER — Other Ambulatory Visit: Payer: BC Managed Care – PPO

## 2018-10-02 ENCOUNTER — Ambulatory Visit: Payer: BC Managed Care – PPO | Admitting: Nurse Practitioner

## 2018-10-02 ENCOUNTER — Other Ambulatory Visit: Payer: Self-pay

## 2018-10-02 ENCOUNTER — Encounter: Payer: Self-pay | Admitting: Nurse Practitioner

## 2018-10-02 ENCOUNTER — Telehealth: Payer: Self-pay | Admitting: Nurse Practitioner

## 2018-10-02 VITALS — BP 120/74 | HR 90 | Temp 98.0°F | Ht 67.0 in | Wt 179.0 lb

## 2018-10-02 DIAGNOSIS — M65331 Trigger finger, right middle finger: Secondary | ICD-10-CM | POA: Diagnosis not present

## 2018-10-02 MED ORDER — DICLOFENAC SODIUM 1 % TD GEL: 2.0000 g | Freq: Four times a day (QID) | TRANSDERMAL | 1 refills | Status: DC

## 2018-10-02 NOTE — Patient Instructions (Addendum)
Use finger splint as discussed. Apply cold compress  2-3times a day (11mins at a time) Elevate hand as musch as possible. You will be contacted to schedule appt with ortho  Trigger Finger  Trigger finger (stenosing tenosynovitis) is a condition that causes a finger to get stuck in a bent position. Each finger has a tough, cord-like tissue that connects muscle to bone (tendon), and each tendon is surrounded by a tunnel of tissue (tendon sheath). To move your finger, your tendon needs to slide freely through the sheath. Trigger finger happens when the tendon or the sheath thickens, making it difficult to move your finger. Trigger finger can affect any finger or a thumb. It may affect more than one finger. Mild cases may clear up with rest and medicine. Severe cases require more treatment. What are the causes? Trigger finger is caused by a thickened finger tendon or tendon sheath. The cause of this thickening is not known. What increases the risk? The following factors may make you more likely to develop this condition:  Doing activities that require a strong grip.  Having rheumatoid arthritis, gout, or diabetes.  Being 56-25 years old.  Being a woman. What are the signs or symptoms? Symptoms of this condition include:  Pain when bending or straightening your finger.  Tenderness or swelling where your finger attaches to the palm of your hand.  A lump in the palm of your hand or on the inside of your finger.  Hearing a popping sound when you try to straighten your finger.  Feeling a popping, catching, or locking sensation when you try to straighten your finger.  Being unable to straighten your finger. How is this diagnosed? This condition is diagnosed based on your symptoms and a physical exam. How is this treated? This condition may be treated by:  Resting your finger and avoiding activities that make symptoms worse.  Wearing a finger splint to keep your finger in a slightly  bent position.  Taking NSAIDs to relieve pain and swelling.  Injecting medicine (steroids) into the tendon sheath to reduce swelling and irritation. Injections may need to be repeated.  Having surgery to open the tendon sheath. This may be done if other treatments do not work and you cannot straighten your finger. You may need physical therapy after surgery. Follow these instructions at home:   Use moist heat to help reduce pain and swelling as told by your health care provider.  Rest your finger and avoid activities that make pain worse. Return to normal activities as told by your health care provider.  If you have a splint, wear it as told by your health care provider.  Take over-the-counter and prescription medicines only as told by your health care provider.  Keep all follow-up visits as told by your health care provider. This is important. Contact a health care provider if:  Your symptoms are not improving with home care. Summary  Trigger finger (stenosing tenosynovitis) causes your finger to get stuck in a bent position, and it can make it difficult and painful to straighten your finger.  This condition develops when a finger tendon or tendon sheath thickens.  Treatment starts with resting, wearing a splint, and taking NSAIDs.  In severe cases, surgery to open the tendon sheath may be needed. This information is not intended to replace advice given to you by your health care provider. Make sure you discuss any questions you have with your health care provider. Document Released: 11/14/2003 Document Revised: 01/06/2017 Document Reviewed:  01/05/2016 Elsevier Patient Education  [place].

## 2018-10-02 NOTE — Telephone Encounter (Signed)
PA started for Diclofenac sodium 1% gel. Waiting for result.   [name] (Key: [id number])

## 2018-10-02 NOTE — Progress Notes (Signed)
Subjective:  Patient ID: [name], female    DOB: 1970-06-10  Age: 48 y.o. MRN: [id number]  CC: Establish Care (est care--right middle finger knot,unable to move it at times,pain at times/going on since 04/2018--getting worse last week/)  She is transferring care from Dr. Brigitte Pulse at [facility].  Hand Pain  The incident occurred more than 1 week ago. There was no injury mechanism. Pain location: right middle finger. The quality of the pain is described as aching and cramping. Radiates to: right wrist. The pain is severe. The pain has been fluctuating since the incident. Pertinent negatives include no chest pain, muscle weakness, numbness or tingling. The symptoms are aggravated by movement and palpation. She has tried NSAIDs for the symptoms. The treatment provided mild relief.   Hx of anxiety: Stable with use of lexapro 5mg  (dose decrease to 10mg  3months due to weight gain). Use of vistaril and lorazepam prn Lorazepam 0.5mg  last filled 01/2018, 15tabs PMP database reviewed.  Hx of Hypothyroidism: Managed by GYN  Hx of allergic urticaria: Managed by allergist, has Epipen. Current use of flonase and zyrtec.  Hx of elevated ALP and AST: Last check 01/2018.  Reviewed past Medical, Social and Family history today.  Outpatient Medications Prior to Visit  Medication Sig Dispense Refill  . cetirizine (ZYRTEC) 10 MG tablet Take 1 tablet (10 mg total) by mouth at bedtime. 90 tablet 3  . clindamycin (CLEOCIN T) 1 % lotion Apply topically 2 (two) times daily. 60 mL 5  . EPINEPHrine (AUVI-Q) 0.3 mg/0.3 mL IJ SOAJ injection Use as directed for severe allergic reaction 2 Device 2  . escitalopram (LEXAPRO) 10 MG tablet Take 1 tablet (10 mg total) by mouth at bedtime. 90 tablet 3  . fluticasone (FLONASE) 50 MCG/ACT nasal spray Place 2 sprays into both nostrils daily. 16 g 6  . hydrOXYzine (ATARAX/VISTARIL) 25 MG tablet Take 1 tablet (25 mg total) by mouth 3 (three) times daily as needed.  60 tablet 4  . levothyroxine (SYNTHROID, LEVOTHROID) 100 MCG tablet Take 1 tablet (100 mcg total) by mouth daily. 90 tablet 3  . LORazepam (ATIVAN) 0.5 MG tablet Take 1-2 tablets (0.5-1 mg total) by mouth at bedtime as needed for anxiety. 30 tablet 2  . Mirabegron (MYRBETRIQ PO) Take by mouth.    . levofloxacin (LEVAQUIN) 750 MG tablet Take 1 tablet (750 mg total) by mouth daily. (Patient not taking: Reported on 10/02/2018) 5 tablet 0  . Meth-Hyo-M Bl-Na Phos-Ph Sal (URIBEL) 118 MG CAPS Take by mouth.     No facility-administered medications prior to visit.     ROS See HPI  Objective:  BP 120/74   Pulse 90   Temp 98 F (36.7 C) (Tympanic)   Ht 5\' 7"  (1.702 m)   Wt 179 lb (81.2 kg)   SpO2 97%   BMI 28.04 kg/m   BP Readings from Last 3 Encounters:  10/02/18 120/74  04/24/18 138/68  04/18/18 119/77    Wt Readings from Last 3 Encounters:  10/02/18 179 lb (81.2 kg)  04/24/18 177 lb 3.2 oz (80.4 kg)  04/18/18 178 lb 9.6 oz (81 kg)   Physical Exam Vitals signs reviewed.  Cardiovascular:     Rate and Rhythm: Normal rate.     Pulses: Normal pulses.  Musculoskeletal:        General: Swelling and tenderness present. No deformity.     Right hand: She exhibits tenderness and swelling. Normal sensation noted. Normal strength noted.  Hands:  Skin:    Findings: No erythema or rash.  Neurological:     Mental Status: She is alert and oriented to person, place, and time.    Lab Results  Component Value Date   WBC 10.1 03/13/2018   HGB 14.4 03/13/2018   HCT 42.2 03/13/2018   PLT 346 03/13/2018   GLUCOSE 96 02/02/2018   CHOL 261 (H) 02/02/2018   TRIG 223 (H) 02/02/2018   HDL 72 02/02/2018   LDLCALC 144 (H) 02/02/2018   ALT 53 (H) 02/02/2018   AST 28 02/02/2018   NA 141 02/02/2018   K 4.1 02/02/2018   CL 102 02/02/2018   CREATININE 0.93 02/02/2018   BUN 12 02/02/2018   CO2 24 02/02/2018   TSH 1.270 02/02/2018   HGBA1C 5.3 11/17/2015    Mm Diag Breast Tomo  Bilateral  Result Date: 08/16/2018 CLINICAL DATA:  48 year old female with history of right breast mastitis 1 month ago presenting for follow-up. The patient states that the pain has significantly decreased since treatment for the mastitis. EXAM: DIGITAL DIAGNOSTIC BILATERAL MAMMOGRAM WITH CAD AND TOMO COMPARISON:  Previous exam(s). ACR Breast Density Category c: The breast tissue is heterogeneously dense, which may obscure small masses. FINDINGS: No suspicious calcifications, masses or areas of distortion are seen in the bilateral breasts. Mammographic images were processed with CAD. IMPRESSION: No mammographic evidence of malignancy in the bilateral breasts. RECOMMENDATION: Screening mammogram in one year.(Code:SM-B-01Y) I have discussed the findings and recommendations with the patient. Results were also provided in writing at the conclusion of the visit. If applicable, a reminder letter will be sent to the patient regarding the next appointment. BI-RADS CATEGORY  1: Negative. Electronically Signed   By: Ammie Ferrier M.D.   On: 08/16/2018 08:16    Assessment & Plan:   Clydene was seen today for establish care.  Diagnoses and all orders for this visit:  Trigger middle finger of right hand -     AMB referral to orthopedics -     diclofenac sodium (VOLTAREN) 1 % GEL; Apply 2 g topically 4 (four) times daily.   I am having Ruffin Frederick start on diclofenac sodium. I am also having her maintain her Uribel, LORazepam, hydrOXYzine, escitalopram, cetirizine, levothyroxine, clindamycin, EPINEPHrine, levofloxacin, fluticasone, and Mirabegron (MYRBETRIQ PO).  Meds ordered this encounter  Medications  . diclofenac sodium (VOLTAREN) 1 % GEL    Sig: Apply 2 g topically 4 (four) times daily.    Dispense:  50 g    Refill:  1    Order Specific Question:   Supervising Provider    Answer:   Lucille Passy [[id number]]    Problem List Items Addressed This Visit    None    Visit Diagnoses    Trigger  middle finger of right hand    -  Primary   Relevant Medications   diclofenac sodium (VOLTAREN) 1 % GEL   Other Relevant Orders   AMB referral to orthopedics       Follow-up: Return in about 3 months (around 01/02/2019) for CPE (Fasting).  Wilfred Lacy, NP

## 2018-10-03 NOTE — Telephone Encounter (Signed)
Notified pharmacy.

## 2018-10-16 ENCOUNTER — Ambulatory Visit: Payer: BC Managed Care – PPO | Admitting: Allergy and Immunology

## 2019-01-01 ENCOUNTER — Ambulatory Visit: Payer: BC Managed Care – PPO | Admitting: Allergy and Immunology

## 2019-01-01 ENCOUNTER — Other Ambulatory Visit: Payer: Self-pay

## 2019-01-01 ENCOUNTER — Encounter: Payer: Self-pay | Admitting: Allergy and Immunology

## 2019-01-01 VITALS — BP 122/70 | HR 75 | Temp 97.5°F | Resp 18

## 2019-01-01 DIAGNOSIS — J3089 Other allergic rhinitis: Secondary | ICD-10-CM | POA: Diagnosis not present

## 2019-01-01 DIAGNOSIS — H1013 Acute atopic conjunctivitis, bilateral: Secondary | ICD-10-CM

## 2019-01-01 DIAGNOSIS — T63441D Toxic effect of venom of bees, accidental (unintentional), subsequent encounter: Secondary | ICD-10-CM

## 2019-01-01 DIAGNOSIS — G43909 Migraine, unspecified, not intractable, without status migrainosus: Secondary | ICD-10-CM | POA: Diagnosis not present

## 2019-01-01 DIAGNOSIS — L299 Pruritus, unspecified: Secondary | ICD-10-CM | POA: Diagnosis not present

## 2019-01-01 DIAGNOSIS — R768 Other specified abnormal immunological findings in serum: Secondary | ICD-10-CM

## 2019-01-01 NOTE — Patient Instructions (Addendum)
  1.  Continue to perform Allergen avoidance measures as best as possible  2.  Continue to Treat and prevent inflammation:   A.  Flonase 1-2 sprays each nostril daily  B.  Prednisone 10 mg - 1 tablet 1 time per day for 10 days only  3. If needed:   A.  Nasal saline  B.  OTC antihistamine / hydroxyzine (dry eye?)  C.  Auvi-Q 0.3, Benadryl, MD/ER evaluation for allergic reaction  D.  Systane eye drops multiple times per day  E.  OTC Pataday - 1 drop each eye 1 time per day  4.  Return to clinic in 6 months or earlier if problem  5. Obtain COVID vaccine when available.

## 2019-01-01 NOTE — Progress Notes (Signed)
[facility]   Follow-up Note  Referring Provider: Shawnee Knapp, MD Primary Provider: Shawnee Knapp, MD Date of Office Visit: 01/01/2019  Subjective:   [name] (DOB: 02-Jan-1971) is a 48 y.o. female who returns to the [facility] [place] on 01/01/2019 in re-evaluation of the following:  HPI: Latesia returns to this clinic in reevaluation of pruritic disorder, allergic rhinitis, migraine syndrome, systemic reaction to hymenoptera venom, and a positive RNP antibody.  I have not seen her in this clinic since 24 April 2018.  Julitza has noticed that over the course of the past month and definitely over the course of the past 2 weeks she has been feeling as though she has "sandpaper eyes" with watery eyes and sneezing and nasal congestion.  This occurs even though she does use her nasal steroid on a regular basis and she has been using hydroxyzine on a regular basis.  It should be noted that she has a dog located inside the household.  As well, because of all the stress at school and because she spends so much time on a computer screen she believes that she has developed more headaches.  She did have a history of rather significant headaches that became quite uncommon while paying attention to her caffeine consumption.  Unfortunately, her headache frequency is now about 1 time per week for which she will take an Aleve.  Her issue with pruritus has also been a little bit active over the course of the past few weeks along with her upper airway issues.  She has been having some problems with her right hand.  Apparently she has some carpal tunnel and she has a trigger finger issue that has required an injection in October 2020.  Allergies as of 01/01/2019      Reactions   Augmentin [amoxicillin-pot Clavulanate] Swelling   Pt has tolerated amoxicillin prior   Sulfa Antibiotics Shortness Of Breath   Doxycycline Calcium Swelling   Retained fluid   Latex Rash      Medication List    cetirizine 10 MG tablet Commonly known as: ZYRTEC Take 1 tablet (10 mg total) by mouth at bedtime.   clindamycin 1 % lotion Commonly known as: CLEOCIN T Apply topically 2 (two) times daily.   diclofenac sodium 1 % Gel Commonly known as: VOLTAREN Apply 2 g topically 4 (four) times daily.   EPINEPHrine 0.3 mg/0.3 mL Soaj injection Commonly known as: Auvi-Q Use as directed for severe allergic reaction   escitalopram 10 MG tablet Commonly known as: LEXAPRO Take 1 tablet (10 mg total) by mouth at bedtime.   fluticasone 50 MCG/ACT nasal spray Commonly known as: FLONASE Place 2 sprays into both nostrils daily.   hydrOXYzine 25 MG tablet Commonly known as: ATARAX/VISTARIL Take 1 tablet (25 mg total) by mouth 3 (three) times daily as needed.   levothyroxine 100 MCG tablet Commonly known as: SYNTHROID Take 1 tablet (100 mcg total) by mouth daily.   LORazepam 0.5 MG tablet Commonly known as: ATIVAN Take 1-2 tablets (0.5-1 mg total) by mouth at bedtime as needed for anxiety.   MYRBETRIQ PO Take by mouth.       Past Medical History:  Diagnosis Date  . Allergy   . Anxiety   . Depression   . Eczema   . Thyroid disease   . Urticaria     Past Surgical History:  Procedure Laterality Date  . CESAREAN SECTION    .  CHOLECYSTECTOMY  2003    Review of systems negative except as noted in HPI / PMHx or noted below:  Review of Systems  Constitutional: Negative.   HENT: Negative.   Eyes: Negative.   Respiratory: Negative.   Cardiovascular: Negative.   Gastrointestinal: Negative.   Genitourinary: Negative.   Musculoskeletal: Negative.   Skin: Negative.   Neurological: Negative.   Endo/Heme/Allergies: Negative.   Psychiatric/Behavioral: Negative.      Objective:   Vitals:   01/01/19 1558  BP: 122/70  Pulse: 75  Resp: 18  Temp: (!) 97.5 F (36.4 C)  SpO2: 98%          Physical Exam Constitutional:       Appearance: She is not diaphoretic.  HENT:     Head: Normocephalic.     Right Ear: Tympanic membrane, ear canal and external ear normal.     Left Ear: Tympanic membrane, ear canal and external ear normal.     Nose: Nose normal. No mucosal edema or rhinorrhea.     Mouth/Throat:     Pharynx: Uvula midline. No oropharyngeal exudate.  Eyes:     Conjunctiva/sclera: Conjunctivae normal.  Neck:     Thyroid: No thyromegaly.     Trachea: Trachea normal. No tracheal tenderness or tracheal deviation.  Cardiovascular:     Rate and Rhythm: Normal rate and regular rhythm.     Heart sounds: Normal heart sounds, S1 normal and S2 normal. No murmur.  Pulmonary:     Effort: No respiratory distress.     Breath sounds: Normal breath sounds. No stridor. No wheezing or rales.  Lymphadenopathy:     Head:     Right side of head: No tonsillar adenopathy.     Left side of head: No tonsillar adenopathy.     Cervical: No cervical adenopathy.  Skin:    Findings: No erythema or rash.     Nails: There is no clubbing.   Neurological:     Mental Status: She is alert.     Diagnostics: none  Assessment and Plan:   1. Perennial allergic rhinitis   2. Allergic conjunctivitis of both eyes   3. Pruritic disorder   4. Migraine syndrome   5. Systemic reaction to bee sting, accidental or unintentional, subsequent encounter   6. Positive sm/RNP antibody     1.  Continue to perform Allergen avoidance measures as best as possible  2.  Continue to Treat and prevent inflammation:   A.  Flonase 1-2 sprays each nostril daily  B.  Prednisone 10 mg - 1 tablet 1 time per day for 10 days only  3. If needed:   A.  Nasal saline  B.  OTC antihistamine / hydroxyzine (dry eye?)  C.  Auvi-Q 0.3, Benadryl, MD/ER evaluation for allergic reaction  D.  Systane eye drops multiple times per day  E.  OTC Pataday - 1 drop each eye 1 time per day  4.  Return to clinic in 6 months or earlier if problem  5. Obtain COVID  vaccine when available.  I will assume that Kambrie may have a flare of her atopic disease because of her dog exposure and treat her with the therapy noted above and we will see what happens over the course of the next several days to week.  There is an issue with consistent use of hydroxyzine in that she may be developing dry eye and some of her "sandpaper eyes" may be secondary to this condition.  I did warn  her about the use of antihistamines and the development of this issue.  As well, she has been having some joint problem affecting her right hand and I suspect that this is a wear and tear issue but she does have a positive RNP antibody and if she develops a systemic arthralgic syndrome then we need to consider the possibility that she has developed a connective tissue disease.  If she does not get her allergic disease under good control then she would be a candidate for immunotherapy and I had a talk with her today about that form of treatment.   Allena Katz, MD Allergy / Immunology [place]

## 2019-01-02 ENCOUNTER — Encounter: Payer: Self-pay | Admitting: Allergy and Immunology

## 2019-02-05 ENCOUNTER — Other Ambulatory Visit: Payer: Self-pay | Admitting: *Deleted

## 2019-02-05 ENCOUNTER — Telehealth: Payer: Self-pay | Admitting: Allergy and Immunology

## 2019-02-05 MED ORDER — CETIRIZINE HCL 10 MG PO TABS: 10.0000 mg | ORAL_TABLET | Freq: Every day | ORAL | 3 refills | Status: DC

## 2019-02-05 NOTE — Telephone Encounter (Signed)
Called to confirm with patient that the Zyrtec was all she needed refilled. Patient confirmed. Prescription refill has been sent to requested pharmacy. Patient verbalized understanding.

## 2019-02-05 NOTE — Telephone Encounter (Signed)
Patient called stating that her Zyrtec prescription is expired and would like Dr. Neldon Mc to take over the prescription. Patient states that Dr. Brigitte Pulse is no longer at the doctors office and wants Dr. Neldon Mc to handle her allergy medications.  Patient would like to be informed if prescription is sent.  Uses [facility] on [address].  Please advise.

## 2019-05-27 ENCOUNTER — Other Ambulatory Visit: Payer: Self-pay | Admitting: Allergy and Immunology

## 2019-06-17 ENCOUNTER — Other Ambulatory Visit: Payer: Self-pay

## 2019-06-17 ENCOUNTER — Encounter: Payer: Self-pay | Admitting: Registered Nurse

## 2019-06-17 ENCOUNTER — Ambulatory Visit: Payer: BC Managed Care – PPO | Admitting: Registered Nurse

## 2019-06-17 VITALS — BP 113/71 | HR 61 | Temp 98.0°F | Resp 16 | Ht 67.0 in | Wt 187.4 lb

## 2019-06-17 DIAGNOSIS — H66002 Acute suppurative otitis media without spontaneous rupture of ear drum, left ear: Secondary | ICD-10-CM

## 2019-06-17 DIAGNOSIS — H60392 Other infective otitis externa, left ear: Secondary | ICD-10-CM | POA: Diagnosis not present

## 2019-06-17 MED ORDER — AZITHROMYCIN 250 MG PO TABS: ORAL_TABLET | ORAL | 0 refills | Status: DC

## 2019-06-17 MED ORDER — OFLOXACIN 0.3 % OT SOLN: 5.0000 [drp] | Freq: Two times a day (BID) | OTIC | 0 refills | Status: DC

## 2019-06-17 MED ORDER — HYDROCORTISONE-ACETIC ACID 1-2 % OT SOLN: 3.0000 [drp] | Freq: Three times a day (TID) | OTIC | 0 refills | Status: DC

## 2019-06-17 NOTE — Patient Instructions (Signed)
° ° ° °  If you have lab work done today you will be contacted with your lab results within the next 2 weeks.  If you have not heard from us then please contact us. The fastest way to get your results is to register for My Chart. ° ° °IF you received an x-ray today, you will receive an invoice from [facility]. Please contact [facility] at [phone] with questions or concerns regarding your invoice.  ° °IF you received labwork today, you will receive an invoice from [facility]. Please contact [facility] at [phone] with questions or concerns regarding your invoice.  ° °Our billing staff will not be able to assist you with questions regarding bills from these companies. ° °You will be contacted with the lab results as soon as they are available. The fastest way to get your results is to activate your My Chart account. Instructions are located on the last page of this paperwork. If you have not heard from us regarding the results in 2 weeks, please contact this office. °  ° ° ° °

## 2019-06-17 NOTE — Progress Notes (Signed)
Acute Office Visit  Subjective:    Patient ID: [name], female    DOB: [birth year], 49 y.o.   MRN: [id number]  Chief Complaint  Patient presents with  . Ear Pain    patient states she woke up and thought she felt water in her bed and her ears were bleeding alot. Per patient her ear has been feeling funny but woke up to blood she feel like it something in the ear. She states its hard to chew because the left side of neck is painful at work.    HPI Patient is in today for acute bleeding from ear  Awoke at 3am this morning feeling moisture around ear, L side of face, and down towards L shoulder. Turned on light - was significant amount of blood. After cleaning up noted that it was coming from her L ear canal.  Pt has been having 1 mo of allergy symptoms. Usually gets laryngitis or similar URI in the spring of every year - this year has been somewhat worse than usual but pt did not question it until today. Reports dulled hearing in L ear - marked hearing loss, but still hearing intact. No injury to ear. Does use Qtips but notes that she does not put them deep into the canal ever for fear of injuring TM. Was able to attend work today though was significantly uncomfortable.  Past Medical History:  Diagnosis Date  . Allergy   . Anxiety   . Depression   . Eczema   . Thyroid disease   . Urticaria     Past Surgical History:  Procedure Laterality Date  . CESAREAN SECTION    . CHOLECYSTECTOMY  2003    Family History  Problem Relation Age of Onset  . Hypertension Father   . Hyperlipidemia Father   . Cancer Maternal Grandmother   . Diabetes Maternal Grandmother   . Hypertension Maternal Grandmother   . Heart disease Maternal Grandfather   . Hyperlipidemia Maternal Grandfather   . Hypertension Maternal Grandfather   . Diabetes Paternal Grandmother   . Stroke Paternal Grandmother   . Diabetes Paternal Grandfather   . Heart disease Paternal Grandfather   .  Hyperlipidemia Paternal Grandfather   . Hypertension Paternal Grandfather     Social History   Socioeconomic History  . Marital status: Married    Spouse name: Not on file  . Number of children: Not on file  . Years of education: Not on file  . Highest education level: Not on file  Occupational History  . Not on file  Tobacco Use  . Smoking status: Never Smoker  . Smokeless tobacco: Never Used  Substance and Sexual Activity  . Alcohol use: No  . Drug use: No  . Sexual activity: Not on file  Other Topics Concern  . Not on file  Social History Narrative  . Not on file   Social Determinants of Health   Financial Resource Strain:   . Difficulty of Paying Living Expenses:   Food Insecurity:   . Worried About Charity fundraiser in the Last Year:   . Arboriculturist in the Last Year:   Transportation Needs:   . Film/video editor (Medical):   Marland Kitchen Lack of Transportation (Non-Medical):   Physical Activity:   . Days of Exercise per Week:   . Minutes of Exercise per Session:   Stress:   . Feeling of Stress :   Social Connections:   . Frequency of  Communication with Friends and Family:   . Frequency of Social Gatherings with Friends and Family:   . Attends Religious Services:   . Active Member of Clubs or Organizations:   . Attends Archivist Meetings:   Marland Kitchen Marital Status:   Intimate Partner Violence:   . Fear of Current or Ex-Partner:   . Emotionally Abused:   Marland Kitchen Physically Abused:   . Sexually Abused:     Outpatient Medications Prior to Visit  Medication Sig Dispense Refill  . cetirizine (ZYRTEC) 10 MG tablet Take 1 tablet (10 mg total) by mouth at bedtime. 90 tablet 3  . diclofenac sodium (VOLTAREN) 1 % GEL Apply 2 g topically 4 (four) times daily. 50 g 1  . EPINEPHrine (AUVI-Q) 0.3 mg/0.3 mL IJ SOAJ injection Use as directed for severe allergic reaction 2 Device 2  . escitalopram (LEXAPRO) 10 MG tablet Take 1 tablet (10 mg total) by mouth at bedtime. 90  tablet 3  . fluticasone (FLONASE) 50 MCG/ACT nasal spray SHAKE LIQUID AND USE 2 SPRAYS IN EACH NOSTRIL DAILY 16 g 0  . hydrOXYzine (ATARAX/VISTARIL) 25 MG tablet Take 1 tablet (25 mg total) by mouth 3 (three) times daily as needed. 60 tablet 4  . levothyroxine (SYNTHROID, LEVOTHROID) 100 MCG tablet Take 1 tablet (100 mcg total) by mouth daily. 90 tablet 3  . LORazepam (ATIVAN) 0.5 MG tablet Take 1-2 tablets (0.5-1 mg total) by mouth at bedtime as needed for anxiety. 30 tablet 2  . Mirabegron (MYRBETRIQ PO) Take by mouth.    . clindamycin (CLEOCIN T) 1 % lotion Apply topically 2 (two) times daily. (Patient not taking: Reported on 06/17/2019) 60 mL 5   No facility-administered medications prior to visit.    Allergies  Allergen Reactions  . Augmentin [Amoxicillin-Pot Clavulanate] Swelling    Pt has tolerated amoxicillin prior  . Sulfa Antibiotics Shortness Of Breath  . Doxycycline Calcium Swelling    Retained fluid  . Latex Rash    Review of Systems Per hpi, others negative on 10 pt ros    Objective:    Physical Exam Vitals and nursing note reviewed.  Constitutional:      General: She is not in acute distress.    Appearance: Normal appearance. She is normal weight. She is not ill-appearing, toxic-appearing or diaphoretic.  HENT:     Head: Normocephalic and atraumatic.     Right Ear: Tympanic membrane, ear canal and external ear normal. No decreased hearing noted. No laceration, drainage, swelling or tenderness. No middle ear effusion. There is no impacted cerumen. No foreign body. No mastoid tenderness. No PE tube. No hemotympanum. Tympanic membrane is not injected, scarred, perforated, erythematous, retracted or bulging. Tympanic membrane has normal mobility.     Left Ear: Decreased hearing noted. Laceration, drainage, swelling and tenderness present.  No middle ear effusion. There is no impacted cerumen. No foreign body. No mastoid tenderness. No PE tube. No hemotympanum. Tympanic  membrane is bulging. Tympanic membrane is not injected, scarred, perforated, erythematous or retracted. Tympanic membrane has normal mobility.     Ears:     Weber exam findings: lateralizes right.    Comments: Exam somewhat limited due to extreme tenderness    Nose: Nose normal. No congestion or rhinorrhea.     Mouth/Throat:     Mouth: Mucous membranes are moist.     Pharynx: Oropharynx is clear. No oropharyngeal exudate or posterior oropharyngeal erythema.  Neck:     Comments: Adenopathy and tenderness through preauricular, posterior  auricular, tonsillar, and cervical chains. Cardiovascular:     Rate and Rhythm: Normal rate and regular rhythm.  Lymphadenopathy:     Cervical: Cervical adenopathy present.  Skin:    General: Skin is warm and dry.     Capillary Refill: Capillary refill takes less than 2 seconds.     Coloration: Skin is not jaundiced or pale.     Findings: No bruising, erythema, lesion or rash.  Neurological:     General: No focal deficit present.     Mental Status: She is alert and oriented to person, place, and time. Mental status is at baseline.  Psychiatric:        Mood and Affect: Mood normal.        Behavior: Behavior normal.        Thought Content: Thought content normal.        Judgment: Judgment normal.     BP 113/71   Pulse 61   Temp 98 F (36.7 C) (Temporal)   Resp 16   Ht 5\' 7"  (1.702 m)   Wt 187 lb 6.4 oz (85 kg)   SpO2 92%   BMI 29.35 kg/m  Wt Readings from Last 3 Encounters:  06/17/19 187 lb 6.4 oz (85 kg)  10/02/18 179 lb (81.2 kg)  04/24/18 177 lb 3.2 oz (80.4 kg)    There are no preventive care reminders to display for this patient.  There are no preventive care reminders to display for this patient.   Lab Results  Component Value Date   TSH 1.270 02/02/2018   Lab Results  Component Value Date   WBC 10.1 03/13/2018   HGB 14.4 03/13/2018   HCT 42.2 03/13/2018   MCV 88 03/13/2018   PLT 346 03/13/2018   Lab Results  Component  Value Date   NA 141 02/02/2018   K 4.1 02/02/2018   CO2 24 02/02/2018   GLUCOSE 96 02/02/2018   BUN 12 02/02/2018   CREATININE 0.93 02/02/2018   BILITOT 0.3 02/02/2018   ALKPHOS 119 (H) 02/02/2018   AST 28 02/02/2018   ALT 53 (H) 02/02/2018   PROT 7.2 02/02/2018   ALBUMIN 4.6 02/02/2018   CALCIUM 9.4 02/02/2018   Lab Results  Component Value Date   CHOL 261 (H) 02/02/2018   Lab Results  Component Value Date   HDL 72 02/02/2018   Lab Results  Component Value Date   LDLCALC 144 (H) 02/02/2018   Lab Results  Component Value Date   TRIG 223 (H) 02/02/2018   Lab Results  Component Value Date   CHOLHDL 3.6 02/02/2018   Lab Results  Component Value Date   HGBA1C 5.3 11/17/2015       Assessment & Plan:   Problem List Items Addressed This Visit    None    Visit Diagnoses    Non-recurrent acute suppurative otitis media of left ear without spontaneous rupture of tympanic membrane    -  Primary   Relevant Medications   azithromycin (ZITHROMAX) 250 MG tablet   ofloxacin (FLOXIN) 0.3 % OTIC solution   Other Relevant Orders   Ambulatory referral to ENT   Infective otitis externa of left ear       Relevant Medications   azithromycin (ZITHROMAX) 250 MG tablet   acetic acid-hydrocortisone (VOSOL-HC) OTIC solution       Meds ordered this encounter  Medications  . azithromycin (ZITHROMAX) 250 MG tablet    Sig: Take 2 tabs on first day. Then take 1 tab daily.  Finish entire supply.    Dispense:  6 tablet    Refill:  0    Order Specific Question:   Supervising Provider    Answer:   Delia Chimes A [id number]  . ofloxacin (FLOXIN) 0.3 % OTIC solution    Sig: Place 5 drops into the left ear 2 (two) times daily.    Dispense:  5 mL    Refill:  0    Order Specific Question:   Supervising Provider    Answer:   Delia Chimes A [id number]  . acetic acid-hydrocortisone (VOSOL-HC) OTIC solution    Sig: Place 3 drops into the left ear 3 (three) times daily.    Dispense:  10  mL    Refill:  0    Order Specific Question:   Supervising Provider    Answer:   Delia Chimes A [id number]   PLAN  Appears as laceration in L ear canal - but bulging TM is suggestive of AOM as well. Will treat both  Refer to ENT as hearing loss and frank bleeding is of concern  ER precautions discussed  Patient encouraged to call clinic with any questions, comments, or concerns.  Maximiano Coss, NP

## 2019-06-25 ENCOUNTER — Other Ambulatory Visit: Payer: Self-pay | Admitting: Allergy and Immunology

## 2019-07-02 ENCOUNTER — Ambulatory Visit: Payer: BC Managed Care – PPO | Admitting: Allergy and Immunology

## 2019-07-02 ENCOUNTER — Other Ambulatory Visit: Payer: Self-pay

## 2019-07-02 ENCOUNTER — Encounter: Payer: Self-pay | Admitting: Allergy and Immunology

## 2019-07-02 VITALS — BP 124/82 | HR 91 | Temp 98.3°F | Resp 16

## 2019-07-02 DIAGNOSIS — H1013 Acute atopic conjunctivitis, bilateral: Secondary | ICD-10-CM | POA: Diagnosis not present

## 2019-07-02 DIAGNOSIS — J3089 Other allergic rhinitis: Secondary | ICD-10-CM

## 2019-07-02 DIAGNOSIS — K219 Gastro-esophageal reflux disease without esophagitis: Secondary | ICD-10-CM

## 2019-07-02 DIAGNOSIS — T63441D Toxic effect of venom of bees, accidental (unintentional), subsequent encounter: Secondary | ICD-10-CM

## 2019-07-02 DIAGNOSIS — L299 Pruritus, unspecified: Secondary | ICD-10-CM

## 2019-07-02 DIAGNOSIS — R768 Other specified abnormal immunological findings in serum: Secondary | ICD-10-CM

## 2019-07-02 DIAGNOSIS — T50905D Adverse effect of unspecified drugs, medicaments and biological substances, subsequent encounter: Secondary | ICD-10-CM

## 2019-07-02 DIAGNOSIS — G43909 Migraine, unspecified, not intractable, without status migrainosus: Secondary | ICD-10-CM

## 2019-07-02 MED ORDER — CETIRIZINE HCL 10 MG PO TABS: 10.0000 mg | ORAL_TABLET | Freq: Every day | ORAL | 3 refills | Status: DC

## 2019-07-02 MED ORDER — FLUTICASONE PROPIONATE 50 MCG/ACT NA SUSP: NASAL | 5 refills | Status: DC

## 2019-07-02 MED ORDER — FAMOTIDINE 40 MG PO TABS
40.0000 mg | ORAL_TABLET | Freq: Every evening | ORAL | 1 refills | Status: DC
Start: 2019-07-02 — End: 2019-09-10

## 2019-07-02 MED ORDER — OMEPRAZOLE 40 MG PO CPDR
40.0000 mg | DELAYED_RELEASE_CAPSULE | Freq: Every day | ORAL | 1 refills | Status: DC
Start: 2019-07-02 — End: 2019-09-10

## 2019-07-02 NOTE — Patient Instructions (Addendum)
  1.  Continue to perform Allergen avoidance measures as best as possible  2.  Continue to Treat and prevent inflammation:   A.  Flonase 1-2 sprays each nostril daily  3. Treat and prevent reflux / LPR:   A. Omeprazole 40 mg - 1 tablet in AM  B. Famotidine 40 mg - 1 tablet in PM  4. If needed:   A.  Nasal saline  B.  OTC antihistamine / hydroxyzine (dry eye?)  C.  Auvi-Q 0.3, Benadryl, MD/ER evaluation for allergic reaction  D.  Systane eye drops multiple times per day  E.  OTC Pataday - 1 drop each eye 1 time per day  5. Evaluation for right arm pain and dermatitis?  6.  Return to clinic in 4 weeks or earlier if problem

## 2019-07-02 NOTE — Progress Notes (Signed)
[facility]   Follow-up Note   Referring Provider: Shawnee Knapp, MD Primary Provider: Shawnee Knapp, MD Date of Office Visit: 07/02/2019  Subjective:   [name] (DOB: February 25, 1970) is a 49 y.o. female who returns to the [facility] [place] on 07/02/2019 in re-evaluation of the following:  HPI: [name] returns to this clinic in evaluation of pruritic disorder, allergic rhinitis, migraine syndrome, systemic reaction to hymenoptera venom with negative IgE venom panel, and a history of a positive RNP antibody.  Her last visit to this clinic was 01 May 2018.  It sounds as though [name] has done relatively well with her respiratory track since her last visit until approximately 1 month ago.  At that point in time she developed itchy throat and pain in her throat and she was using some Chloraseptic and then developed laryngitis and then ear itching and then she woke up one morning and noted bleeding out of her left ear for which she went to the urgent care center and was diagnosed with an episode of otitis media and given a Z-Pak and a cortisone drop.  She then visited with ENT, Dr. Benjamine Mola, who performed rhinoscopy and told her that she scratched her ear canal and started famotidine on a nightly basis as well as high-dose prednisone at 60 mg a day.  She became agitated and developed insomnia with her prednisone use.  Then she developed very significant urticarial lesions affecting her upper chest and she describes them as blood red vessels.  She use some hydrocortisone cream and some of the itchiness went away within a few days but overall within a week everything was resolved.  The only issue that she has been having lately on a pretty persistent basis is sore throat once again.  All of her other symptoms have abated.  Her history of headaches also appear to flare when she was stuck in this episode over the course the past month but  fortunately that appears to have waned at this point in time.  She also became extremely itchy as noted above with her hives but her overall pruritic disorder was under very good control.  When she was last seen in this clinic she was having problems with "sandpaper eyes" and I recommended that she modify her hydroxyzine use and indeed that appeared to be the issue responsible for her dry eye issue.  She has a problem with her right hand and right elbow and she has an intermittent rash that she describes as redness that comes up on her right forearm that which has been a longstanding issue.  She thinks that it might be getting just a little bit worse.  She does have a positive RNP.  When she took prednisone for her ear issue all of her pain and rash on her right arm was eliminated.  Allergies as of 07/02/2019      Reactions   Augmentin [amoxicillin-pot Clavulanate] Swelling   Pt has tolerated amoxicillin prior   Sulfa Antibiotics Shortness Of Breath   Doxycycline Calcium Swelling   Retained fluid   Latex Rash      Medication List      acetic acid-hydrocortisone OTIC solution Commonly known as: VOSOL-HC Place 3 drops into the left ear 3 (three) times daily.   azithromycin 250 MG tablet Commonly known as: ZITHROMAX Take 2 tabs on first day. Then take 1 tab daily. Finish entire supply.   cetirizine 10 MG  tablet Commonly known as: ZYRTEC Take 1 tablet (10 mg total) by mouth at bedtime.   clindamycin 1 % lotion Commonly known as: CLEOCIN T Apply topically 2 (two) times daily.   diclofenac sodium 1 % Gel Commonly known as: VOLTAREN Apply 2 g topically 4 (four) times daily.   EPINEPHrine 0.3 mg/0.3 mL Soaj injection Commonly known as: Auvi-Q Use as directed for severe allergic reaction   escitalopram 10 MG tablet Commonly known as: LEXAPRO Take 1 tablet (10 mg total) by mouth at bedtime.   famotidine 40 MG tablet Commonly known as: PEPCID Take 1 tablet (40 mg total) by  mouth every evening. Started by: Jiles Prows, MD   fluticasone 50 MCG/ACT nasal spray Commonly known as: FLONASE SHAKE LIQUID AND USE 2 SPRAYS IN EACH NOSTRIL DAILY   hydrOXYzine 25 MG tablet Commonly known as: ATARAX/VISTARIL Take 1 tablet (25 mg total) by mouth 3 (three) times daily as needed.   levothyroxine 100 MCG tablet Commonly known as: SYNTHROID Take 1 tablet (100 mcg total) by mouth daily.   LORazepam 0.5 MG tablet Commonly known as: ATIVAN Take 1-2 tablets (0.5-1 mg total) by mouth at bedtime as needed for anxiety.   MYRBETRIQ PO Take by mouth.   ofloxacin 0.3 % OTIC solution Commonly known as: FLOXIN Place 5 drops into the left ear 2 (two) times daily.       Past Medical History:  Diagnosis Date  . Allergy   . Anxiety   . Depression   . Eczema   . Thyroid disease   . Urticaria     Past Surgical History:  Procedure Laterality Date  . CESAREAN SECTION    . CHOLECYSTECTOMY  2003    Review of systems negative except as noted in HPI / PMHx or noted below:  Review of Systems  Constitutional: Negative.   HENT: Negative.   Eyes: Negative.   Respiratory: Negative.   Cardiovascular: Negative.   Gastrointestinal: Negative.   Genitourinary: Negative.   Musculoskeletal: Negative.   Skin: Negative.   Neurological: Negative.   Endo/Heme/Allergies: Negative.   Psychiatric/Behavioral: Negative.      Objective:   Vitals:   07/02/19 1553  BP: 124/82  Pulse: 91  Resp: 16  Temp: 98.3 F (36.8 C)  SpO2: 97%          Physical Exam Constitutional:      Appearance: She is not diaphoretic.  HENT:     Head: Normocephalic.     Right Ear: Tympanic membrane, ear canal and external ear normal.     Left Ear: Tympanic membrane, ear canal and external ear normal.     Nose: Nose normal. No mucosal edema or rhinorrhea.     Mouth/Throat:     Pharynx: Uvula midline. No oropharyngeal exudate.  Eyes:     Conjunctiva/sclera: Conjunctivae normal.  Neck:      Thyroid: No thyromegaly.     Trachea: Trachea normal. No tracheal tenderness or tracheal deviation.  Cardiovascular:     Rate and Rhythm: Normal rate and regular rhythm.     Heart sounds: Normal heart sounds, S1 normal and S2 normal. No murmur.  Pulmonary:     Effort: No respiratory distress.     Breath sounds: Normal breath sounds. No stridor. No wheezing or rales.  Lymphadenopathy:     Head:     Right side of head: No tonsillar adenopathy.     Left side of head: No tonsillar adenopathy.     Cervical: No cervical adenopathy.  Skin:    Findings: No erythema or rash.     Nails: There is no clubbing.  Neurological:     Mental Status: She is alert.     Diagnostics: none   Assessment and Plan:   1. Perennial allergic rhinitis   2. Allergic conjunctivitis of both eyes   3. Pruritic disorder   4. Migraine syndrome   5. Systemic reaction to bee sting, accidental or unintentional, subsequent encounter   6. Positive sm/RNP antibody   7. Adverse effect of drug, subsequent encounter   8. LPRD (laryngopharyngeal reflux disease)     1.  Continue to perform Allergen avoidance measures as best as possible  2.  Continue to Treat and prevent inflammation:   A.  Flonase 1-2 sprays each nostril daily  3. Treat and prevent reflux / LPR:   A. Omeprazole 40 mg - 1 tablet in AM  B. Famotidine 40 mg - 1 tablet in PM  4. If needed:   A.  Nasal saline  B.  OTC antihistamine / hydroxyzine (dry eye?)  C.  Auvi-Q 0.3, Benadryl, MD/ER evaluation for allergic reaction  D.  Systane eye drops multiple times per day  E.  OTC Pataday - 1 drop each eye 1 time per day  5. Evaluation for right arm pain and dermatitis?  6.  Return to clinic in 4 weeks or earlier if problem  [name] went through an episode this spring that may been secondary to allergen exposure but may also have had a component of LPR.  We will address her LPR issue by having her aggressively treat this condition with a  combination of a proton pump inhibitor and H2 receptor blocker.  She will continue to use Flonase for her atopic respiratory disease and other medications if required.  Her episode of otitis media and possible otitis externa appears to have abated.  It does sound as though she developed a rather significant cutaneous reaction in the context of using azithromycin and I recommended that she not use this medication at this point.  I will see her back in this clinic in 4 weeks to assess her response to treatment directed at LPR.  Concerning her positive RNP, we need to interpret this in the context of associated systemic or constitutional symptoms and in this case there does not appear to be a large collection of those issues but she does appear to have right hand and right elbow pain and some occasional dermatitis involving that right arm.  Most of this completely abated when she uses systemic steroid in the treatment of her otitis media.  We may need to refer her on for further evaluation and clarification of whether or not her joint issue and skin issue affecting her right arm is a manifestation of a possible mixed connective tissue disease.  I will further discuss that issue when she returns to this clinic in 4 weeks.  Allena Katz, MD [facility]

## 2019-07-03 ENCOUNTER — Encounter: Payer: Self-pay | Admitting: Allergy and Immunology

## 2019-07-28 ENCOUNTER — Other Ambulatory Visit: Payer: Self-pay | Admitting: Allergy and Immunology

## 2019-08-13 ENCOUNTER — Ambulatory Visit: Payer: BC Managed Care – PPO | Admitting: Allergy and Immunology

## 2019-09-10 ENCOUNTER — Other Ambulatory Visit: Payer: Self-pay

## 2019-09-10 ENCOUNTER — Encounter: Payer: Self-pay | Admitting: Allergy and Immunology

## 2019-09-10 ENCOUNTER — Ambulatory Visit: Payer: BC Managed Care – PPO | Admitting: Allergy and Immunology

## 2019-09-10 VITALS — BP 110/70 | HR 78 | Temp 97.9°F | Resp 18 | Wt 187.0 lb

## 2019-09-10 DIAGNOSIS — K219 Gastro-esophageal reflux disease without esophagitis: Secondary | ICD-10-CM

## 2019-09-10 DIAGNOSIS — G43909 Migraine, unspecified, not intractable, without status migrainosus: Secondary | ICD-10-CM

## 2019-09-10 DIAGNOSIS — H1013 Acute atopic conjunctivitis, bilateral: Secondary | ICD-10-CM | POA: Diagnosis not present

## 2019-09-10 DIAGNOSIS — J3089 Other allergic rhinitis: Secondary | ICD-10-CM

## 2019-09-10 DIAGNOSIS — L299 Pruritus, unspecified: Secondary | ICD-10-CM

## 2019-09-10 DIAGNOSIS — M2559 Pain in other specified joint: Secondary | ICD-10-CM

## 2019-09-10 DIAGNOSIS — R768 Other specified abnormal immunological findings in serum: Secondary | ICD-10-CM

## 2019-09-10 DIAGNOSIS — T63441D Toxic effect of venom of bees, accidental (unintentional), subsequent encounter: Secondary | ICD-10-CM

## 2019-09-10 MED ORDER — CETIRIZINE HCL 10 MG PO TABS: 10.0000 mg | ORAL_TABLET | Freq: Every day | ORAL | 3 refills | Status: DC

## 2019-09-10 MED ORDER — FLUTICASONE PROPIONATE 50 MCG/ACT NA SUSP: NASAL | 3 refills | Status: DC

## 2019-09-10 MED ORDER — HYDROXYZINE HCL 25 MG PO TABS: 25.0000 mg | ORAL_TABLET | Freq: Three times a day (TID) | ORAL | 4 refills | Status: DC | PRN

## 2019-09-10 MED ORDER — OMEPRAZOLE 40 MG PO CPDR: 40.0000 mg | DELAYED_RELEASE_CAPSULE | Freq: Every day | ORAL | 1 refills | Status: DC

## 2019-09-10 MED ORDER — PREDNISONE 10 MG PO TABS
10.0000 mg | ORAL_TABLET | Freq: Every day | ORAL | 0 refills | Status: DC
Start: 2019-09-10 — End: 2019-11-13

## 2019-09-10 MED ORDER — FAMOTIDINE 40 MG PO TABS: 40.0000 mg | ORAL_TABLET | Freq: Every evening | ORAL | 1 refills | Status: DC

## 2019-09-10 NOTE — Patient Instructions (Addendum)
  1.  Continue to perform Allergen avoidance measures as best as possible  2.  Continue to Treat and prevent inflammation:   A.  Flonase 1-2 sprays each nostril daily  B. Prednisone 10 mg - 1 tablet 1 time per day for 5 days only  3.  Continue to Treat and prevent reflux / LPR:   A. Omeprazole 40 mg - 1 tablet in AM  B. Famotidine 40 mg - 1 tablet in PM  4. If needed:   A.  Nasal saline  B.  OTC antihistamine / hydroxyzine (dry eye?)  C.  Auvi-Q 0.3, Benadryl, MD/ER evaluation for allergic reaction  D.  Systane eye drops multiple times per day  E.  OTC Pataday - 1 drop each eye 1 time per day  5. Evaluation with rheumatology for right arm pain, joint stiffness, and Elevated RNP antibody  6.  Obtain fall flu vaccine  7. Return to clinic in 6 months or earlier if problem

## 2019-09-10 NOTE — Progress Notes (Signed)
[place]   Follow-up Note  Referring Provider: Shawnee Knapp, MD Primary Provider: Shawnee Knapp, MD Date of Office Visit: 09/10/2019  Subjective:   [name] (DOB: 1970/09/30) is a 49 y.o. 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  I last saw in this clinic on 02 Jul 2019.  Her airways are doing relatively well but she has noticed a little bit of pain and pressure on her left cheek especially when she wears glasses.  It does not sound as though she has had any ugly nasal discharge inability to smell or significant amount of congestive symptoms.  She continues to use a nasal steroid on a regular basis.  She is somewhat hesitant about adding any antihistamines because of dry eye syndrome.  She has not really been having much problems with headache recently.  She was stung by a wasp last week and developed a very large local reaction without any associated systemic symptoms not requiring the administration of epinephrine and treated with Benadryl.  Her reflux is under excellent control while using omeprazole and famotidine in combination.  She continues to have rather significant right upper extremity pain especially her right hand and especially fingers #3 4 and 5-year when she wakes up in the morning they are stiff and she cannot really use her hand.  She has to use her left hand to do all her activities of daily living in the morning.  As well, in the morning her back is stiff and her elbows are stiff.  Allergies as of 09/10/2019      Reactions   Augmentin  [amoxicillin-pot Clavulanate] Swelling   Pt has tolerated amoxicillin prior   Sulfa Antibiotics Shortness Of Breath   Doxycycline Calcium Swelling   Retained fluid   Latex Rash      Medication List      acetic acid-hydrocortisone OTIC solution Commonly known as: VOSOL-HC Place 3 drops into the left ear 3 (three) times daily.   azithromycin 250 MG tablet Commonly known as: ZITHROMAX Take 2 tabs on first day. Then take 1 tab daily. Finish entire supply.   cetirizine 10 MG tablet Commonly known as: ZYRTEC Take 1 tablet (10 mg total) by mouth at bedtime.   clindamycin 1 % lotion Commonly known as: CLEOCIN T Apply topically 2 (two) times daily.   diclofenac sodium 1 % Gel Commonly known as: VOLTAREN Apply 2 g topically 4 (four) times daily.   EPINEPHrine 0.3 mg/0.3 mL Soaj injection Commonly known as: Auvi-Q Use as directed for severe allergic reaction   escitalopram 10 MG tablet Commonly known as: LEXAPRO Take 1 tablet (10 mg total) by mouth at bedtime.   famotidine 40 MG tablet Commonly known as: PEPCID Take 1 tablet (40 mg total) by mouth every evening.   fluticasone 50 MCG/ACT nasal spray Commonly known as: FLONASE SHAKE LIQUID AND USE 2 SPRAYS IN EACH NOSTRIL DAILY   hydrOXYzine 25 MG tablet Commonly known as: ATARAX/VISTARIL Take 1 tablet (25 mg total) by mouth 3 (three) times daily as needed.   levothyroxine 100 MCG tablet Commonly known as: SYNTHROID Take  1 tablet (100 mcg total) by mouth daily.   LORazepam 0.5 MG tablet Commonly known as: ATIVAN Take 1-2 tablets (0.5-1 mg total) by mouth at bedtime as needed for anxiety.   MYRBETRIQ PO Take by mouth.   ofloxacin 0.3 % OTIC solution Commonly known as: FLOXIN Place 5 drops into the left ear 2 (two) times daily.   omeprazole 40 MG capsule Commonly known as: PRILOSEC Take 1 capsule (40 mg total) by mouth daily.       Past Medical History:  Diagnosis Date  . Allergy   . Anxiety   .  Depression   . Eczema   . Thyroid disease   . Urticaria     Past Surgical History:  Procedure Laterality Date  . CESAREAN SECTION    . CHOLECYSTECTOMY  2003    Review of systems negative except as noted in HPI / PMHx or noted below:  Review of Systems  Constitutional: Negative.   HENT: Negative.   Eyes: Negative.   Respiratory: Negative.   Cardiovascular: Negative.   Gastrointestinal: Negative.   Genitourinary: Negative.   Musculoskeletal: Negative.   Skin: Negative.   Neurological: Negative.   Endo/Heme/Allergies: Negative.   Psychiatric/Behavioral: Negative.      Objective:   Vitals:   09/11/19 1233  BP: 110/70  Pulse: 78  Resp: 18  Temp: 97.9 F (36.6 C)  SpO2: 99%      Weight: 187 lb (84.8 kg)   Physical Exam Constitutional:      Appearance: She is not diaphoretic.  HENT:     Head: Normocephalic.     Right Ear: Tympanic membrane, ear canal and external ear normal.     Left Ear: Tympanic membrane, ear canal and external ear normal.     Nose: Nose normal. No mucosal edema or rhinorrhea.     Mouth/Throat:     Pharynx: Uvula midline. No oropharyngeal exudate.  Eyes:     Conjunctiva/sclera: Conjunctivae normal.  Neck:     Thyroid: No thyromegaly.     Trachea: Trachea normal. No tracheal tenderness or tracheal deviation.  Cardiovascular:     Rate and Rhythm: Normal rate and regular rhythm.     Heart sounds: Normal heart sounds, S1 normal and S2 normal. No murmur heard.   Pulmonary:     Effort: No respiratory distress.     Breath sounds: Normal breath sounds. No stridor. No wheezing or rales.  Lymphadenopathy:     Head:     Right side of head: No tonsillar adenopathy.     Left side of head: No tonsillar adenopathy.     Cervical: No cervical adenopathy.  Skin:    Findings: No erythema or rash.     Nails: There is no clubbing.  Neurological:     Mental Status: She is alert.     Diagnostics: none   Assessment and Plan:   1. Perennial  allergic rhinitis   2. Allergic conjunctivitis of both eyes   3. Pruritic disorder   4. Migraine syndrome   5. Systemic reaction to bee sting, accidental or unintentional, subsequent encounter   6. Positive sm/RNP antibody   7. LPRD (laryngopharyngeal reflux disease)   8. Pain in other joint      1.  Continue to perform Allergen avoidance measures as best as possible  2.  Continue to Treat and prevent inflammation:   A.  Flonase 1-2 sprays each nostril daily  B. Prednisone 10 mg - 1 tablet 1 time per day for 5  days only  3.  Continue to Treat and prevent reflux / LPR:   A. Omeprazole 40 mg - 1 tablet in AM  B. Famotidine 40 mg - 1 tablet in PM  4. If needed:   A.  Nasal saline  B.  OTC antihistamine / hydroxyzine (dry eye?)  C.  Auvi-Q 0.3, Benadryl, MD/ER evaluation for allergic reaction  D.  Systane eye drops multiple times per day  E.  OTC Pataday - 1 drop each eye 1 time per day  5. Evaluation with rheumatology for right arm pain, joint stiffness, and Elevated RNP antibody  6.  Obtain fall flu vaccine  7. Return to clinic in 6 months or earlier if problem  I am going to give [place] a low-dose of systemic steroids to help with some of the inflammation she is suffering from regarding her left maxillary sinus and continued to have her use Flonase on a regular basis.  Her reflux is under very good control and we will keep her on omeprazole and famotidine to address this issue.  She has been having rather persistent issues with her right hand and joint stiffness and she does have this issue occurring in the context of an elevated RNP and we will refer her on to rheumatology for further evaluation of this issue.  I will see her back in his clinic in 6 months or earlier if there is a problem.  Allena Katz, MD Allergy / [facility] [place]

## 2019-09-11 ENCOUNTER — Encounter: Payer: Self-pay | Admitting: Allergy and Immunology

## 2019-10-09 DIAGNOSIS — G5620 Lesion of ulnar nerve, unspecified upper limb: Secondary | ICD-10-CM | POA: Insufficient documentation

## 2019-10-09 DIAGNOSIS — G5621 Lesion of ulnar nerve, right upper limb: Secondary | ICD-10-CM | POA: Insufficient documentation

## 2019-10-29 ENCOUNTER — Other Ambulatory Visit: Payer: Self-pay | Admitting: Obstetrics and Gynecology

## 2019-11-13 ENCOUNTER — Encounter (HOSPITAL_BASED_OUTPATIENT_CLINIC_OR_DEPARTMENT_OTHER): Payer: Self-pay | Admitting: Obstetrics and Gynecology

## 2019-11-13 ENCOUNTER — Other Ambulatory Visit: Payer: Self-pay

## 2019-11-13 NOTE — Progress Notes (Signed)
Spoke w/ via phone for pre-op interview---pt Lab needs dos----   Cbc t & s, urine poct            Lab results------none COVID test ------11-14-2019 1500 Arrive at -------845 am 11-18-2019 NPO after MN NO Solid Food.  Clear liquids from MN until---745 am then npo Medications to take morning of surgery -----flonase, levothryoxine, omeprazole, lorazepam prn, certrizine Diabetic medication -----n/a Patient Special Instructions -----none Pre-Op special Istructions -----none Patient verbalized understanding of instructions that were given at this phone interview. Patient denies shortness of breath, chest pain, fever, cough at this phone interview.

## 2019-11-14 ENCOUNTER — Other Ambulatory Visit (HOSPITAL_COMMUNITY)
Admission: RE | Admit: 2019-11-14 | Discharge: 2019-11-14 | Disposition: A | Discharge status: Home / Self Care | Payer: BC Managed Care – PPO | Source: Ambulatory Visit | Attending: Obstetrics and Gynecology | Admitting: Obstetrics and Gynecology

## 2019-11-14 DIAGNOSIS — Z20822 Contact with and (suspected) exposure to covid-19: Secondary | ICD-10-CM | POA: Insufficient documentation

## 2019-11-14 DIAGNOSIS — Z01812 Encounter for preprocedural laboratory examination: Secondary | ICD-10-CM | POA: Diagnosis not present

## 2019-11-14 LAB — SARS CORONAVIRUS 2 (TAT 6-24 HRS): SARS Coronavirus 2: NEGATIVE

## 2019-11-18 ENCOUNTER — Ambulatory Visit (HOSPITAL_BASED_OUTPATIENT_CLINIC_OR_DEPARTMENT_OTHER): Payer: BC Managed Care – PPO | Admitting: Anesthesiology

## 2019-11-18 ENCOUNTER — Other Ambulatory Visit: Payer: Self-pay

## 2019-11-18 ENCOUNTER — Encounter (HOSPITAL_BASED_OUTPATIENT_CLINIC_OR_DEPARTMENT_OTHER)
Admission: RE | Disposition: A | Discharge status: Home / Self Care | Payer: Self-pay | Source: Home / Self Care | Attending: Obstetrics and Gynecology

## 2019-11-18 ENCOUNTER — Encounter (HOSPITAL_BASED_OUTPATIENT_CLINIC_OR_DEPARTMENT_OTHER): Payer: Self-pay | Admitting: Obstetrics and Gynecology

## 2019-11-18 ENCOUNTER — Ambulatory Visit (HOSPITAL_BASED_OUTPATIENT_CLINIC_OR_DEPARTMENT_OTHER)
Admission: RE | Admit: 2019-11-18 | Discharge: 2019-11-18 | Disposition: A | Discharge status: Home / Self Care | Payer: BC Managed Care – PPO | Attending: Obstetrics and Gynecology | Admitting: Obstetrics and Gynecology

## 2019-11-18 DIAGNOSIS — Z881 Allergy status to other antibiotic agents status: Secondary | ICD-10-CM | POA: Insufficient documentation

## 2019-11-18 DIAGNOSIS — F418 Other specified anxiety disorders: Secondary | ICD-10-CM | POA: Insufficient documentation

## 2019-11-18 DIAGNOSIS — E039 Hypothyroidism, unspecified: Secondary | ICD-10-CM | POA: Insufficient documentation

## 2019-11-18 DIAGNOSIS — Z79899 Other long term (current) drug therapy: Secondary | ICD-10-CM | POA: Diagnosis not present

## 2019-11-18 DIAGNOSIS — Z882 Allergy status to sulfonamides status: Secondary | ICD-10-CM | POA: Insufficient documentation

## 2019-11-18 DIAGNOSIS — Z880 Allergy status to penicillin: Secondary | ICD-10-CM | POA: Insufficient documentation

## 2019-11-18 DIAGNOSIS — K219 Gastro-esophageal reflux disease without esophagitis: Secondary | ICD-10-CM | POA: Insufficient documentation

## 2019-11-18 DIAGNOSIS — Z791 Long term (current) use of non-steroidal anti-inflammatories (NSAID): Secondary | ICD-10-CM | POA: Insufficient documentation

## 2019-11-18 DIAGNOSIS — N840 Polyp of corpus uteri: Secondary | ICD-10-CM | POA: Diagnosis not present

## 2019-11-18 DIAGNOSIS — N920 Excessive and frequent menstruation with regular cycle: Secondary | ICD-10-CM | POA: Insufficient documentation

## 2019-11-18 DIAGNOSIS — Z79890 Hormone replacement therapy (postmenopausal): Secondary | ICD-10-CM | POA: Insufficient documentation

## 2019-11-18 HISTORY — DX: Other specified postprocedural states: Z98.890

## 2019-11-18 HISTORY — DX: Trigger finger, unspecified finger: M65.30

## 2019-11-18 HISTORY — DX: Hypothyroidism, unspecified: E03.9

## 2019-11-18 HISTORY — DX: Migraine, unspecified, not intractable, without status migrainosus: G43.909

## 2019-11-18 HISTORY — DX: Nausea with vomiting, unspecified: R11.2

## 2019-11-18 HISTORY — PX: DILITATION & CURRETTAGE/HYSTROSCOPY WITH NOVASURE ABLATION: SHX5568

## 2019-11-18 HISTORY — DX: Other complications of anesthesia, initial encounter: T88.59XA

## 2019-11-18 HISTORY — DX: Excessive and frequent menstruation with regular cycle: N92.0

## 2019-11-18 HISTORY — DX: Interstitial cystitis (chronic) without hematuria: N30.10

## 2019-11-18 HISTORY — DX: Gastro-esophageal reflux disease without esophagitis: K21.9

## 2019-11-18 LAB — CBC
HCT: 42.3 % (ref 36.0–46.0)
Hemoglobin: 13.8 g/dL (ref 12.0–15.0)
MCH: 29.9 pg (ref 26.0–34.0)
MCHC: 32.6 g/dL (ref 30.0–36.0)
MCV: 91.6 fL (ref 80.0–100.0)
Platelets: 315 10*3/uL (ref 150–400)
RBC: 4.62 MIL/uL (ref 3.87–5.11)
RDW: 13.5 % (ref 11.5–15.5)
WBC: 6.6 10*3/uL (ref 4.0–10.5)
nRBC: 0 % (ref 0.0–0.2)

## 2019-11-18 LAB — POCT PREGNANCY, URINE: Preg Test, Ur: NEGATIVE

## 2019-11-18 SURGERY — DILATATION & CURETTAGE/HYSTEROSCOPY WITH NOVASURE ABLATION
Anesthesia: General | Site: Vagina

## 2019-11-18 MED ORDER — OXYCODONE HCL 5 MG/5ML PO SOLN: 5.0000 mg | Freq: Once | ORAL | Status: AC | PRN

## 2019-11-18 MED ORDER — BUPIVACAINE HCL (PF) 0.25 % IJ SOLN
INTRAMUSCULAR | Status: DC | PRN
  Administered 2019-11-18: 20 mL

## 2019-11-18 MED ORDER — EPHEDRINE SULFATE-NACL 50-0.9 MG/10ML-% IV SOSY
PREFILLED_SYRINGE | INTRAVENOUS | Status: DC | PRN
  Administered 2019-11-18 (×2): 10 mg via INTRAVENOUS

## 2019-11-18 MED ORDER — DEXAMETHASONE SODIUM PHOSPHATE 10 MG/ML IJ SOLN
INTRAMUSCULAR | Status: AC
  Filled 2019-11-18: qty 1

## 2019-11-18 MED ORDER — PROPOFOL 10 MG/ML IV BOLUS
INTRAVENOUS | Status: AC
  Filled 2019-11-18: qty 20

## 2019-11-18 MED ORDER — DEXMEDETOMIDINE (PRECEDEX) IN NS 20 MCG/5ML (4 MCG/ML) IV SYRINGE
PREFILLED_SYRINGE | INTRAVENOUS | Status: DC | PRN
  Administered 2019-11-18: 8 ug via INTRAVENOUS

## 2019-11-18 MED ORDER — PROPOFOL 10 MG/ML IV BOLUS
INTRAVENOUS | Status: DC | PRN
  Administered 2019-11-18: 170 mg via INTRAVENOUS

## 2019-11-18 MED ORDER — CEFAZOLIN SODIUM-DEXTROSE 2-4 GM/100ML-% IV SOLN
2.0000 g | INTRAVENOUS | Status: AC
  Administered 2019-11-18: 2 g via INTRAVENOUS

## 2019-11-18 MED ORDER — LIDOCAINE 2% (20 MG/ML) 5 ML SYRINGE
INTRAMUSCULAR | Status: DC | PRN
  Administered 2019-11-18: 100 mg via INTRAVENOUS

## 2019-11-18 MED ORDER — LIDOCAINE 2% (20 MG/ML) 5 ML SYRINGE
INTRAMUSCULAR | Status: AC
  Filled 2019-11-18: qty 5

## 2019-11-18 MED ORDER — ONDANSETRON HCL 4 MG/2ML IJ SOLN
INTRAMUSCULAR | Status: AC
  Filled 2019-11-18: qty 2

## 2019-11-18 MED ORDER — OXYCODONE HCL 5 MG PO TABS
5.0000 mg | ORAL_TABLET | Freq: Once | ORAL | Status: AC | PRN
  Administered 2019-11-18: 5 mg via ORAL

## 2019-11-18 MED ORDER — DEXAMETHASONE SODIUM PHOSPHATE 10 MG/ML IJ SOLN
INTRAMUSCULAR | Status: DC | PRN
  Administered 2019-11-18: 10 mg via INTRAVENOUS

## 2019-11-18 MED ORDER — OXYCODONE HCL 5 MG PO TABS
ORAL_TABLET | ORAL | Status: AC
  Filled 2019-11-18: qty 1

## 2019-11-18 MED ORDER — CEFAZOLIN SODIUM-DEXTROSE 2-4 GM/100ML-% IV SOLN
INTRAVENOUS | Status: AC
  Filled 2019-11-18: qty 100

## 2019-11-18 MED ORDER — FENTANYL CITRATE (PF) 100 MCG/2ML IJ SOLN
INTRAMUSCULAR | Status: DC | PRN
Start: 2019-11-18 — End: 2019-11-18
  Administered 2019-11-18: 100 ug via INTRAVENOUS
  Administered 2019-11-18: 50 ug via INTRAVENOUS

## 2019-11-18 MED ORDER — LACTATED RINGERS IV SOLN: INTRAVENOUS | Status: DC

## 2019-11-18 MED ORDER — FENTANYL CITRATE (PF) 100 MCG/2ML IJ SOLN: 25.0000 ug | INTRAMUSCULAR | Status: DC | PRN

## 2019-11-18 MED ORDER — FENTANYL CITRATE (PF) 100 MCG/2ML IJ SOLN
INTRAMUSCULAR | Status: AC
  Filled 2019-11-18: qty 2

## 2019-11-18 MED ORDER — MIDAZOLAM HCL 5 MG/5ML IJ SOLN
INTRAMUSCULAR | Status: DC | PRN
  Administered 2019-11-18: 2 mg via INTRAVENOUS

## 2019-11-18 MED ORDER — KETOROLAC TROMETHAMINE 30 MG/ML IJ SOLN
INTRAMUSCULAR | Status: AC
  Filled 2019-11-18: qty 1

## 2019-11-18 MED ORDER — POVIDONE-IODINE 10 % EX SWAB: 2.0000 "application " | Freq: Once | CUTANEOUS | Status: DC

## 2019-11-18 MED ORDER — MEPERIDINE HCL 25 MG/ML IJ SOLN: 6.2500 mg | INTRAMUSCULAR | Status: DC | PRN

## 2019-11-18 MED ORDER — SCOPOLAMINE 1 MG/3DAYS TD PT72
MEDICATED_PATCH | TRANSDERMAL | Status: DC | PRN
  Administered 2019-11-18: 1 via TRANSDERMAL

## 2019-11-18 MED ORDER — ACETAMINOPHEN 325 MG PO TABS: 325.0000 mg | ORAL_TABLET | ORAL | Status: DC | PRN

## 2019-11-18 MED ORDER — MIDAZOLAM HCL 2 MG/2ML IJ SOLN
INTRAMUSCULAR | Status: AC
  Filled 2019-11-18: qty 2

## 2019-11-18 MED ORDER — EPHEDRINE 5 MG/ML INJ
INTRAVENOUS | Status: AC
  Filled 2019-11-18: qty 10

## 2019-11-18 MED ORDER — PROMETHAZINE HCL 25 MG/ML IJ SOLN: 6.2500 mg | INTRAMUSCULAR | Status: DC | PRN

## 2019-11-18 MED ORDER — DEXMEDETOMIDINE (PRECEDEX) IN NS 20 MCG/5ML (4 MCG/ML) IV SYRINGE
PREFILLED_SYRINGE | INTRAVENOUS | Status: AC
  Filled 2019-11-18: qty 5

## 2019-11-18 MED ORDER — SCOPOLAMINE 1 MG/3DAYS TD PT72
MEDICATED_PATCH | TRANSDERMAL | Status: AC
  Filled 2019-11-18: qty 1

## 2019-11-18 MED ORDER — KETOROLAC TROMETHAMINE 30 MG/ML IJ SOLN
30.0000 mg | Freq: Once | INTRAMUSCULAR | Status: AC | PRN
  Administered 2019-11-18: 30 mg via INTRAVENOUS

## 2019-11-18 MED ORDER — ACETAMINOPHEN 160 MG/5ML PO SOLN: 325.0000 mg | ORAL | Status: DC | PRN

## 2019-11-18 SURGICAL SUPPLY — 18 items
ABLATOR SURESOUND NOVASURE (ABLATOR) ×2 IMPLANT
CATH ROBINSON RED A/P 16FR (CATHETERS) ×1 IMPLANT
CATH SILICONE 16FRX5CC (CATHETERS) ×1 IMPLANT
GLOVE BIO SURGEON STRL SZ7.5 (GLOVE) ×1 IMPLANT
GLOVE BIOGEL PI IND STRL 7.0 (GLOVE) ×1 IMPLANT
GLOVE BIOGEL PI IND STRL 7.5 (GLOVE) IMPLANT
GLOVE BIOGEL PI INDICATOR 7.0 (GLOVE) ×1
GLOVE BIOGEL PI INDICATOR 7.5 (GLOVE) ×1
GLOVE SURG SS PI 7.0 STRL IVOR (GLOVE) ×1 IMPLANT
GLOVE SURG SS PI 7.5 STRL IVOR (GLOVE) ×1 IMPLANT
GOWN STRL REUS W/ TWL LRG LVL3 (GOWN DISPOSABLE) ×2 IMPLANT
GOWN STRL REUS W/TWL LRG LVL3 (GOWN DISPOSABLE) ×4
HIBICLENS CHG 4% 4OZ (MISCELLANEOUS) ×2 IMPLANT
KIT PROCEDURE FLUENT (KITS) ×2 IMPLANT
PACK VAGINAL MINOR WOMEN LF (CUSTOM PROCEDURE TRAY) ×2 IMPLANT
PAD OB MATERNITY 4.3X12.25 (PERSONAL CARE ITEMS) ×2 IMPLANT
PAD PREP 24X48 CUFFED NSTRL (MISCELLANEOUS) ×2 IMPLANT
TOWEL OR 17X26 10 PK STRL BLUE (TOWEL DISPOSABLE) ×2 IMPLANT

## 2019-11-18 NOTE — Op Note (Signed)
11/18/2019  11:09 AM  PATIENT:  [name]  49 y.o. female  PRE-OPERATIVE DIAGNOSIS:  Menorrhagia  POST-OPERATIVE DIAGNOSIS:  Menorrhagia  PROCEDURE:  Procedure(s): DILATATION & CURETTAGE HYSTEROSCOPY WITH NOVASURE ABLATION ENDOMETRIAL POLYPECTOMY  SURGEON:  Surgeon(s): Brien Few, MD  ASSISTANTS: none   ANESTHESIA:   local and general  ESTIMATED BLOOD LOSS: MINIMAL  DRAINS: none   LOCAL MEDICATIONS USED:  MARCAINE    and Amount: 20 ml  SPECIMEN:  Source of Specimen:  EMC AND POLYP  DISPOSITION OF SPECIMEN:  PATHOLOGY  COUNTS:  YES  DICTATION #:  PLAN OF CARE: DC HOME  PATIENT DISPOSITION:  PACU - hemodynamically stable.

## 2019-11-18 NOTE — Transfer of Care (Signed)
Immediate Anesthesia Transfer of Care Note  Patient: [name]  Procedure(s) Performed: DILATATION & CURETTAGE/HYSTEROSCOPY WITH NOVASURE ABLATION (N/A Vagina )  Patient Location: PACU  Anesthesia Type:General  Level of Consciousness: awake, alert , oriented and patient cooperative  Airway & Oxygen Therapy: Patient Spontanous Breathing and Patient connected to nasal cannula oxygen  Post-op Assessment: Report given to RN, Post -op Vital signs reviewed and stable and Patient moving all extremities  Post vital signs: Reviewed and stable  Last Vitals:  Vitals Value Taken Time  BP 149/83 11/18/19 1118  Temp    Pulse 100 11/18/19 1119  Resp 12 11/18/19 1119  SpO2 97 % 11/18/19 1119  Vitals shown include unvalidated device data.  Last Pain:  Vitals:   11/18/19 0838  TempSrc: Oral  PainSc: 0-No pain         Complications: No complications documented.

## 2019-11-18 NOTE — Discharge Instructions (Signed)
DISCHARGE INSTRUCTIONS: HYSTEROSCOPY / ENDOMETRIAL ABLATION The following instructions have been prepared to help you care for yourself upon your return home.   May take Ibuprofen after 5:30pm  May take stool softner while taking narcotic pain medication to prevent constipation.  Drink plenty of water.  Personal hygiene: Marland Kitchen Use sanitary pads for vaginal drainage, not tampons. . Shower the day after your procedure. . NO tub baths, pools or Jacuzzis for 2-3 weeks. . Wipe front to back after using the bathroom.  Activity and limitations: . Do NOT drive or operate any equipment for 24 hours. The effects of anesthesia are still present and drowsiness may result. . Do NOT rest in bed all day. . Walking is encouraged. . Walk up and down stairs slowly. . You may resume your normal activity in one to two days or as indicated by your physician. Sexual activity: NO intercourse for at least 2 weeks after the procedure, or as indicated by your Doctor.  Diet: Eat a light meal as desired this evening. You may resume your usual diet tomorrow.  Return to Work: You may resume your work activities in one to two days or as indicated by Marine scientist.  What to expect after your surgery: Expect to have vaginal bleeding/discharge for 2-3 days and spotting for up to 10 days. It is not unusual to have soreness for up to 1-2 weeks. You may have a slight burning sensation when you urinate for the first day. Mild cramps may continue for a couple of days. You may have a regular period in 2-6 weeks.  Call your doctor for any of the following: . Excessive vaginal bleeding or clotting, saturating and changing one pad every hour. . Inability to urinate 6 hours after discharge from hospital. . Pain not relieved by pain medication. . Fever of 100.4 F or greater. . Unusual vaginal discharge or odor.  Return to office _________________Call for an appointment ___________________ Patient's signature:  ______________________ Nurse's signature ________________________   Post Anesthesia Home Care Instructions  Activity: Get plenty of rest for the remainder of the day. A responsible individual must stay with you for 24 hours following the procedure.  For the next 24 hours, DO NOT: -Drive a car -Paediatric nurse -Drink alcoholic beverages -Take any medication unless instructed by your physician -Make any legal decisions or sign important papers.  Meals: Start with liquid foods such as gelatin or soup. Progress to regular foods as tolerated. Avoid greasy, spicy, heavy foods. If nausea and/or vomiting occur, drink only clear liquids until the nausea and/or vomiting subsides. Call your physician if vomiting continues.  Special Instructions/Symptoms: Your throat may feel dry or sore from the anesthesia or the breathing tube placed in your throat during surgery. If this causes discomfort, gargle with warm salt water. The discomfort should disappear within 24 hours.  If you had a scopolamine patch placed behind your ear for the management of post- operative nausea and/or vomiting:  1. The medication in the patch is effective for 72 hours, after which it should be removed.  Wrap patch in a tissue and discard in the trash. Wash hands thoroughly with soap and water. 2. You may remove the patch earlier than 72 hours if you experience unpleasant side effects which may include dry mouth, dizziness or visual disturbances. 3. Avoid touching the patch. Wash your hands with soap and water after contact with the patch.

## 2019-11-18 NOTE — Anesthesia Procedure Notes (Signed)
Procedure Name: LMA Insertion Date/Time: 11/18/2019 10:48 AM Performed by: Rogers Blocker, CRNA Pre-anesthesia Checklist: Patient identified, Emergency Drugs available, Suction available and Patient being monitored Patient Re-evaluated:Patient Re-evaluated prior to induction Oxygen Delivery Method: Circle System Utilized Preoxygenation: Pre-oxygenation with 100% oxygen Induction Type: IV induction Ventilation: Mask ventilation without difficulty LMA: LMA inserted LMA Size: 4.0 Number of attempts: 1 Placement Confirmation: positive ETCO2 Tube secured with: Tape Dental Injury: Teeth and Oropharynx as per pre-operative assessment

## 2019-11-18 NOTE — Anesthesia Preprocedure Evaluation (Addendum)
Anesthesia Evaluation  Patient identified by MRN, date of birth, ID band Patient awake    Reviewed: Allergy & Precautions, NPO status , Patient's Chart, lab work & pertinent test results  History of Anesthesia Complications (+) PONV and history of anesthetic complications  Airway Mallampati: I       Dental no notable dental hx.    Pulmonary neg pulmonary ROS,    Pulmonary exam normal        Cardiovascular Normal cardiovascular exam     Neuro/Psych  Headaches, PSYCHIATRIC DISORDERS Anxiety Depression    GI/Hepatic Neg liver ROS, GERD  Medicated and Controlled,  Endo/Other  Hypothyroidism   Renal/GU negative Renal ROS  negative genitourinary   Musculoskeletal negative musculoskeletal ROS (+)   Abdominal Normal abdominal exam  (+)   Peds  Hematology negative hematology ROS (+)   Anesthesia Other Findings   Reproductive/Obstetrics                            Anesthesia Physical Anesthesia Plan  ASA: II  Anesthesia Plan: General   Post-op Pain Management:    Induction: Intravenous  PONV Risk Score and Plan: 4 or greater and Dexamethasone, Midazolam and Scopolamine patch - Pre-op  Airway Management Planned: LMA  Additional Equipment: None  Intra-op Plan:   Post-operative Plan: Extubation in OR  Informed Consent: I have reviewed the patients History and Physical, chart, labs and discussed the procedure including the risks, benefits and alternatives for the proposed anesthesia with the patient or authorized representative who has indicated his/her understanding and acceptance.     Dental advisory given  Plan Discussed with: CRNA  Anesthesia Plan Comments:        Anesthesia Quick Evaluation

## 2019-11-18 NOTE — H&P (Signed)
[name] is an 49 y.o. female. Refractory menorrhagia for surgical intervention.  Pertinent Gynecological History: Menses: flow is moderate Bleeding: dysfunctional uterine bleeding Contraception: none DES exposure: denies Blood transfusions: none Sexually transmitted diseases: no past history Previous GYN Procedures: na  Last mammogram: normal Date: 2020 Last pap: normal Date: 2020 OB History: G2, P2   Menstrual History: Menarche age: 56 Patient's last menstrual period was 11/08/2019.    Past Medical History:  Diagnosis Date  . Allergy   . Anxiety   . Complication of anesthesia    " wakes up  crying"  . Depression   . Eczema   . GERD (gastroesophageal reflux disease)   . Hypothyroidism   . Interstitial cystitis   . Menorrhagia   . Migraines   . PONV (postoperative nausea and vomiting)    likes phenergan and scopolamine patch  . Thyroid disease   . Trigger finger of right hand   . Urticaria     Past Surgical History:  Procedure Laterality Date  . carpal tunnel release Bilateral 2010   tennis elbow also done  . CESAREAN SECTION     x 2  . CHOLECYSTECTOMY  2003  . DIAGNOSTIC LAPAROSCOPY  20002   thru belly button to check for endometriosis    Family History  Problem Relation Age of Onset  . Hypertension Father   . Hyperlipidemia Father   . Cancer Maternal Grandmother   . Diabetes Maternal Grandmother   . Hypertension Maternal Grandmother   . Heart disease Maternal Grandfather   . Hyperlipidemia Maternal Grandfather   . Hypertension Maternal Grandfather   . Diabetes Paternal Grandmother   . Stroke Paternal Grandmother   . Diabetes Paternal Grandfather   . Heart disease Paternal Grandfather   . Hyperlipidemia Paternal Grandfather   . Hypertension Paternal Grandfather     Social History:  reports that she has never smoked. She has never used smokeless tobacco. She reports current alcohol use. She reports that she does not use  drugs.  Allergies:  Allergies  Allergen Reactions  . Augmentin [Amoxicillin-Pot Clavulanate] Swelling    Pt has tolerated amoxicillin prior Black rings around eyes  . Sulfa Antibiotics Shortness Of Breath  . Doxycycline Calcium Swelling    Retained fluid  . Latex Rash    Medications Prior to Admission  Medication Sig Dispense Refill Last Dose  . escitalopram (LEXAPRO) 10 MG tablet Take 1 tablet (10 mg total) by mouth at bedtime. (Patient taking differently: Take 5 mg by mouth at bedtime. ) 90 tablet 3 11/17/2019 at Unknown time  . famotidine (PEPCID) 40 MG tablet Take 1 tablet (40 mg total) by mouth every evening. 90 tablet 1 11/18/2019 at Unknown time  . fluticasone (FLONASE) 50 MCG/ACT nasal spray 1-2 sprays per nostril daily 16 g 3 11/18/2019 at Unknown time  . hydrOXYzine (ATARAX/VISTARIL) 25 MG tablet Take 1 tablet (25 mg total) by mouth 3 (three) times daily as needed. 60 tablet 4 11/17/2019 at Unknown time  . levothyroxine (SYNTHROID, LEVOTHROID) 100 MCG tablet Take 1 tablet (100 mcg total) by mouth daily. 90 tablet 3 11/18/2019 at Unknown time  . LORazepam (ATIVAN) 0.5 MG tablet Take 1-2 tablets (0.5-1 mg total) by mouth at bedtime as needed for anxiety. 30 tablet 2 11/18/2019 at Unknown time  . naproxen sodium (ALEVE) 220 MG tablet Take 220 mg by mouth.   Past Week at Unknown time  . omeprazole (PRILOSEC) 40 MG capsule Take 1 capsule (40 mg total) by mouth daily.  90 capsule 1 11/18/2019 at Unknown time  . OVER THE COUNTER MEDICATION cbd gummies prn hs   Past Week at Unknown time  . cetirizine (ZYRTEC) 10 MG tablet Take 1 tablet (10 mg total) by mouth at bedtime. (Patient taking differently: Take 10 mg by mouth daily. ) 90 tablet 3   . EPINEPHrine (AUVI-Q) 0.3 mg/0.3 mL IJ SOAJ injection Use as directed for severe allergic reaction 2 Device 2     Review of Systems  Constitutional: Negative.   All other systems reviewed and are negative.   Blood pressure 127/83, pulse 77,  temperature 98.6 F (37 C), temperature source Oral, resp. rate 16, height 5\' 7"  (1.702 m), weight 85 kg, last menstrual period 11/08/2019, SpO2 100 %. Physical Exam Vitals and nursing note reviewed.  Constitutional:      Appearance: Normal appearance.  HENT:     Head: Normocephalic and atraumatic.  Cardiovascular:     Rate and Rhythm: Normal rate and regular rhythm.     Pulses: Normal pulses.     Heart sounds: Normal heart sounds.  Pulmonary:     Effort: Pulmonary effort is normal.     Breath sounds: Normal breath sounds.  Abdominal:     General: Bowel sounds are normal.     Palpations: Abdomen is soft.  Genitourinary:    General: Normal vulva.  Musculoskeletal:        General: Normal range of motion.     Cervical back: Normal range of motion and neck supple.  Skin:    General: Skin is warm and dry.  Neurological:     General: No focal deficit present.     Mental Status: She is alert.  Psychiatric:        Mood and Affect: Mood normal.        Behavior: Behavior normal.     Results for orders placed or performed during the hospital encounter of 11/18/19 (from the past 24 hour(s))  CBC     Status: None   Collection Time: 11/18/19  8:33 AM  Result Value Ref Range   WBC 6.6 4.0 - 10.5 K/uL   RBC 4.62 3.87 - 5.11 MIL/uL   Hemoglobin 13.8 12.0 - 15.0 g/dL   HCT 42.3 36 - 46 %   MCV 91.6 80.0 - 100.0 fL   MCH 29.9 26.0 - 34.0 pg   MCHC 32.6 30.0 - 36.0 g/dL   RDW 13.5 11.5 - 15.5 %   Platelets 315 150 - 400 K/uL   nRBC 0.0 0.0 - 0.2 %  Pregnancy, urine POC     Status: None   Collection Time: 11/18/19  8:51 AM  Result Value Ref Range   Preg Test, Ur NEGATIVE NEGATIVE    No results found.  Assessment/Plan: Refractory menorrhagia. Diag HS, D&C with EAB RIsks of anesthesia, infection, bleeding with injury to surrounding organs and need for repair discussed. Consent done.  Cleland Simkins J 11/18/2019, 9:47 AM

## 2019-11-18 NOTE — Anesthesia Postprocedure Evaluation (Signed)
Anesthesia Post Note  Patient: [name]  Procedure(s) Performed: DILATATION & CURETTAGE/HYSTEROSCOPY WITH NOVASURE ABLATION (N/A Vagina )     Patient location during evaluation: Phase II Anesthesia Type: General Level of consciousness: awake Pain management: pain level controlled Vital Signs Assessment: post-procedure vital signs reviewed and stable Respiratory status: spontaneous breathing Cardiovascular status: stable Postop Assessment: no apparent nausea or vomiting Anesthetic complications: no   No complications documented.  Last Vitals:  Vitals:   11/18/19 1130 11/18/19 1145  BP: (!) 142/84 125/79  Pulse: 88 83  Resp: 16 18  Temp:  36.4 C  SpO2: 99% 98%    Last Pain:  Vitals:   11/18/19 1200  TempSrc:   PainSc: 4    Pain Goal:                   Huston Foley

## 2019-11-19 ENCOUNTER — Encounter (HOSPITAL_BASED_OUTPATIENT_CLINIC_OR_DEPARTMENT_OTHER): Payer: Self-pay | Admitting: Obstetrics and Gynecology

## 2019-11-19 LAB — SURGICAL PATHOLOGY

## 2019-11-19 NOTE — Op Note (Signed)
[name], [facility] MEDICAL RECORD [id number] ACCOUNT [id number] DATE OF BIRTH:[birth year] FACILITY: [facility] LOCATION: [facility] PHYSICIAN:Luigi Stuckey [name], MD  OPERATIVE REPORT  DATE OF PROCEDURE:  11/18/2019  PREOPERATIVE DIAGNOSIS:  Refractory menorrhagia.  POSTOPERATIVE DIAGNOSES:  Refractory menorrhagia plus endometrial polyp.  PROCEDURE:  Diagnostic hysteroscopy, dilatation and curettage, endometrial polypectomy, NovaSure endometrial ablation.  SURGEON:  Brien Few, MD  ASSISTANT:  None.  ANESTHESIA:  General.  ESTIMATED BLOOD LOSS:  50 mL.  FLUID DEFICIT:  371 mL  COMPLICATIONS:  None.  DRAINS:  None.  COUNTS:  Correct.  DISPOSITION:  The patient was taken to recovery in good condition.  BRIEF OPERATIVE NOTE:  After being apprised of the risks of anesthesia, infection, bleeding, and surrounding organs, possible need for repair, delayed versus immediate complications including bowel and bladder injury, possible need for repair, the  patient was brought to the operating room and was administered general anesthetic without complications.  Prepped and draped in usual sterile fashion, catheterized until the bladder was empty.  Exam under anesthesia reveals a retroflexed uterus and no  adnexal masses.  Dilute paracervical block placed, 20 mL total.  Cervix easily dilated up to 23 Pratt dilator.  Hysteroscope placed.  Visualization reveals a posterior wall endometrial polyp, otherwise normal cavity.  D and C performed.  Sharp curettage  using 4-quadrant method.  Endometrial polypectomy was performed without difficulty.  NovaSure device was placed, seated to a length of 6 and to a width of 3, initiated for 1 minute without complications.  Device was removed and inspected.   Revisualization of endometrial cavity reveals a well-ablated cavity without evidence of perforation.  The patient tolerated the procedure well, was awakened and transferred to recovery in good  condition.  JN/NUANCE  D:11/19/2019 T:11/19/2019 JOB:[id number]

## 2020-01-13 ENCOUNTER — Other Ambulatory Visit: Payer: Self-pay | Admitting: Allergy and Immunology

## 2020-02-25 DIAGNOSIS — M7701 Medial epicondylitis, right elbow: Secondary | ICD-10-CM | POA: Insufficient documentation

## 2020-03-10 ENCOUNTER — Telehealth: Payer: Self-pay

## 2020-03-10 ENCOUNTER — Ambulatory Visit: Payer: BC Managed Care – PPO | Admitting: Allergy and Immunology

## 2020-03-10 ENCOUNTER — Other Ambulatory Visit: Payer: Self-pay

## 2020-03-10 VITALS — BP 112/78 | Temp 97.7°F | Resp 16 | Ht 67.0 in | Wt 193.2 lb

## 2020-03-10 DIAGNOSIS — G43909 Migraine, unspecified, not intractable, without status migrainosus: Secondary | ICD-10-CM

## 2020-03-10 DIAGNOSIS — J3089 Other allergic rhinitis: Secondary | ICD-10-CM

## 2020-03-10 DIAGNOSIS — M2559 Pain in other specified joint: Secondary | ICD-10-CM

## 2020-03-10 DIAGNOSIS — H1013 Acute atopic conjunctivitis, bilateral: Secondary | ICD-10-CM | POA: Diagnosis not present

## 2020-03-10 DIAGNOSIS — R768 Other specified abnormal immunological findings in serum: Secondary | ICD-10-CM

## 2020-03-10 DIAGNOSIS — T63441D Toxic effect of venom of bees, accidental (unintentional), subsequent encounter: Secondary | ICD-10-CM

## 2020-03-10 MED ORDER — FAMOTIDINE 40 MG PO TABS: 40.0000 mg | ORAL_TABLET | Freq: Every evening | ORAL | 2 refills | Status: DC

## 2020-03-10 MED ORDER — HYDROXYZINE HCL 25 MG PO TABS
25.0000 mg | ORAL_TABLET | Freq: Three times a day (TID) | ORAL | 4 refills | Status: DC | PRN
Start: 2020-03-10 — End: 2020-11-10

## 2020-03-10 MED ORDER — OMEPRAZOLE 40 MG PO CPDR
40.0000 mg | DELAYED_RELEASE_CAPSULE | Freq: Every day | ORAL | 2 refills | Status: DC
Start: 2020-03-10 — End: 2020-11-10

## 2020-03-10 MED ORDER — FLUTICASONE PROPIONATE 50 MCG/ACT NA SUSP: NASAL | 2 refills | Status: DC

## 2020-03-10 NOTE — Patient Instructions (Addendum)
  1.  Continue to perform Allergen avoidance measures as best as possible  2. Slowly taper down caffeine consumption  3.  Continue to Treat and prevent inflammation:   A.  Flonase 1-2 sprays each nostril daily  4.  Continue to Treat and prevent reflux / LPR:   A. Omeprazole 40 mg - 1 tablet in AM  B. Famotidine 40 mg - 1 tablet in PM  5. If needed:   A.  Nasal saline  B.  OTC antihistamine / hydroxyzine (dry eye?)  C.  Auvi-Q 0.3, Benadryl, MD/ER evaluation for allergic reaction  D.  Systane eye drops multiple times per day  E.  OTC Pataday - 1 drop each eye 1 time per day  6. Return to clinic in 6 months or earlier if problem  7.  Arrange for rheumatology appointment for right arm pain, joint stiffness, and elevated RNP antibody.

## 2020-03-10 NOTE — Progress Notes (Signed)
[facility]   Follow-up Note  Referring Provider: Shawnee Knapp, MD Primary Provider: Shawnee Knapp, MD Date of Office Visit: 03/10/2020  Subjective:   [name] (DOB: [birth year]) is a 50 y.o. 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  I last saw her in this clinic on 10 September 2019.  Overall she has done relatively well regarding her airway and has not required a systemic steroid or an antibiotic for any type of airway issue.  She has noticed that she has had a little bit more headache for the past 2 weeks or so.  She increased her caffeine consumption significantly around Christmas season and she has slowly been tapering back.  Today she had a really bad headache around her eyes and on the top of her head and is making her little bit nauseated.  Her reflux is under very good control on her current medical plan and she is had no issues with her throat.  She has not been stung by hymenoptera.  She still continues to have back stiffness and elbow stiffness and she still has this pain affecting her right arm and recently she received an injection into her left elbow for "tendinitis" but unfortunately that did not really help her pain very much.  She never did visit with rheumatology.  She has had 2 [place] vaccines, 1 Moderna COVID vaccine, and a flu vaccine.  Allergies as of 03/10/2020      Reactions   Augmentin [amoxicillin-pot Clavulanate] Swelling   Pt has tolerated amoxicillin prior Black rings around eyes   Sulfa Antibiotics Shortness Of Breath   Doxycycline Calcium Swelling    Retained fluid   Latex Rash      Medication List      cetirizine 10 MG tablet Commonly known as: ZYRTEC Take 1 tablet (10 mg total) by mouth at bedtime.   EPINEPHrine 0.3 mg/0.3 mL Soaj injection Commonly known as: Auvi-Q Use as directed for severe allergic reaction   escitalopram 10 MG tablet Commonly known as: LEXAPRO Take 1 tablet (10 mg total) by mouth at bedtime.   famotidine 40 MG tablet Commonly known as: PEPCID Take 1 tablet (40 mg total) by mouth every evening.   fluticasone 50 MCG/ACT nasal spray Commonly known as: FLONASE SHAKE LIQUID AND USE 2 SPRAYS IN EACH NOSTRIL DAILY   hydrOXYzine 25 MG tablet Commonly known as: ATARAX/VISTARIL Take 1 tablet (25 mg total) by mouth 3 (three) times daily as needed.   levothyroxine 100 MCG tablet Commonly known as: SYNTHROID Take 1 tablet (100 mcg total) by mouth daily.   LORazepam 0.5 MG tablet Commonly known as: ATIVAN Take 1-2 tablets (0.5-1 mg total) by mouth at bedtime as needed for anxiety.   naproxen sodium 220 MG tablet Commonly known as: ALEVE Take 220 mg by mouth.   omeprazole 40 MG capsule Commonly known as: PRILOSEC Take 1 capsule (40 mg total) by mouth daily.   OVER THE COUNTER MEDICATION cbd gummies prn hs       Past Medical History:  Diagnosis Date  . Allergy   . Anxiety   . Complication of anesthesia    "  wakes up  crying"  . Depression   . Eczema   . GERD (gastroesophageal reflux disease)   . Hypothyroidism   . Interstitial cystitis   . Menorrhagia   . Migraines   . PONV (postoperative nausea and vomiting)    likes phenergan and scopolamine patch  . Thyroid disease   . Trigger finger of right hand   . Urticaria     Past Surgical History:  Procedure Laterality Date  . carpal tunnel release Bilateral 2010   tennis elbow also done  . CESAREAN SECTION     x 2  . CHOLECYSTECTOMY  2003  . DIAGNOSTIC LAPAROSCOPY  20002   thru belly button to check for endometriosis  . DILITATION  & CURRETTAGE/HYSTROSCOPY WITH NOVASURE ABLATION N/A 11/18/2019   Procedure: DILATATION & CURETTAGE/HYSTEROSCOPY WITH NOVASURE ABLATION;  Surgeon: Olivia Mackie, MD;  Location: [facility];  Service: Gynecology;  Laterality: N/A;    Review of systems negative except as noted in HPI / PMHx or noted below:  Review of Systems  Constitutional: Negative.   HENT: Negative.   Eyes: Negative.   Respiratory: Negative.   Cardiovascular: Negative.   Gastrointestinal: Negative.   Genitourinary: Negative.   Musculoskeletal: Negative.   Skin: Negative.   Neurological: Negative.   Endo/Heme/Allergies: Negative.   Psychiatric/Behavioral: Negative.      Objective:   Vitals:   03/10/20 1541  BP: 112/78  Resp: 16  Temp: 97.7 F (36.5 C)   Height: 5\' 7"  (170.2 cm)  Weight: 193 lb 3.2 oz (87.6 kg)   Physical Exam Constitutional:      Appearance: She is not diaphoretic.  HENT:     Head: Normocephalic.     Right Ear: Tympanic membrane, ear canal and external ear normal.     Left Ear: Tympanic membrane, ear canal and external ear normal.     Nose: Nose normal. No mucosal edema or rhinorrhea.     Mouth/Throat:     Mouth: Oropharynx is clear and moist and mucous membranes are normal.     Pharynx: Uvula midline. No oropharyngeal exudate.  Eyes:     Conjunctiva/sclera: Conjunctivae normal.  Neck:     Thyroid: No thyromegaly.     Trachea: Trachea normal. No tracheal tenderness or tracheal deviation.  Cardiovascular:     Rate and Rhythm: Normal rate and regular rhythm.     Heart sounds: Normal heart sounds, S1 normal and S2 normal. No murmur heard.   Pulmonary:     Effort: No respiratory distress.     Breath sounds: Normal breath sounds. No stridor. No wheezing or rales.  Musculoskeletal:        General: No edema.  Lymphadenopathy:     Head:     Right side of head: No tonsillar adenopathy.     Left side of head: No tonsillar adenopathy.     Cervical: No cervical  adenopathy.  Skin:    Findings: No erythema or rash.     Nails: There is no clubbing.  Neurological:     Mental Status: She is alert.     Diagnostics: none   Assessment and Plan:   1. Perennial allergic rhinitis   2. Allergic conjunctivitis of both eyes   3. Migraine syndrome   4. Systemic reaction to bee sting, accidental or unintentional, subsequent encounter   5. Pain in other joint   6. Positive sm/RNP antibody     1.  Continue to perform Allergen avoidance measures as best as possible  2. Slowly taper down caffeine consumption  3.  Continue to Treat and prevent inflammation:   A.  Flonase 1-2 sprays each nostril daily  4.  Continue to Treat and prevent reflux / LPR:   A. Omeprazole 40 mg - 1 tablet in AM  B. Famotidine 40 mg - 1 tablet in PM  5. If needed:   A.  Nasal saline  B.  OTC antihistamine / hydroxyzine (dry eye?)  C.  Auvi-Q 0.3, Benadryl, MD/ER evaluation for allergic reaction  D.  Systane eye drops multiple times per day  E.  OTC Pataday - 1 drop each eye 1 time per day  6. Return to clinic in 6 months or earlier if problem  7.  Arrange for rheumatology appointment for right arm pain, joint stiffness, and elevated RNP antibody.  L[place]i will continue to use some anti-inflammatory agents for her airway and aggressive therapy directed against reflux which actually appears to be working pretty well regarding these 2 organ systems.  She has had a headache and she had a really bad headache today.  I have asked her to slowly taper down her caffeine consumption as she did increase her caffeine consumption during this holiday season.  Hopefully that will take care of her headaches.  If not, she is going to require further evaluation.  We will go to get her to see rheumatology regarding her joint stiffness in the context of an elevated RNP antibody.  We will see her back in his clinic in 6 months or earlier if there is a problem.  Allena Katz, MD Allergy /  Immunology [facility]

## 2020-03-10 NOTE — Telephone Encounter (Signed)
Please Arrange referral  for rheumatology appointment for right arm pain and  joint stiffness

## 2020-03-11 ENCOUNTER — Encounter: Payer: Self-pay | Admitting: Allergy and Immunology

## 2020-03-11 ENCOUNTER — Telehealth: Payer: Self-pay

## 2020-03-11 NOTE — Addendum Note (Signed)
Addended by: Valere Dross on: 03/11/2020 02:00 PM   Modules accepted: Orders

## 2020-03-11 NOTE — Telephone Encounter (Signed)
Please place referral to Rheumatology for Joint pain and elevated RNP antibody

## 2020-03-24 NOTE — Telephone Encounter (Signed)
Patient is scheduled with Bo Merino, MD on 05/04/2020.

## 2020-04-03 DIAGNOSIS — M25512 Pain in left shoulder: Secondary | ICD-10-CM | POA: Insufficient documentation

## 2020-04-20 DIAGNOSIS — M25522 Pain in left elbow: Secondary | ICD-10-CM | POA: Insufficient documentation

## 2020-04-20 NOTE — Progress Notes (Signed)
Office Visit Note  Patient: [name]             Date of Birth: 12/20/1970           MRN: [id number]             PCP: Brien Few, MD Referring: Jiles Prows, MD Visit Date: 05/04/2020 Occupation: _0 @  Subjective:  +RNP   History of Present Illness: [name] is a 50 y.o. female seen in consultation per request of Dr. Neldon Mc.  According the patient she been seeing Dr. Neldon Mc for allergies.  She had been found to be allergic to penicillin and latex.  She also has history of frequent sinus infections.  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  2 years ago Dr. Alanda Slim did some blood work and her RNP was positive.  She was referred to me but the referral got lost and she could not get an appointment.  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 French Ana.  She states 5-year later as the symptoms recurred in her right lateral epicondyle region.  Which has bothered her off and on.  For the last 3 years she has been having lower back pain she states in the morning her back is very stiff and she has difficulty bending.  The pain is mostly in the midline sometimes radiates to her buttock region bilaterally.  Last year she developed right third fourth and fifth trigger finger for which she had cortisone injection by Dr. Jeannie Fend and the symptoms resolved.  She states in December 2021 she started having pain and discomfort in her left lateral epicondyle region for which she had cortisone injection but the symptoms did not resolve.  Few weeks later she developed a left trapezius swelling and tenderness.  She was having difficulty rotating her head.  She states she was seen at the urgent care at [facility] where she was given  a prednisone taper which helped.  The left lateral epicondylitis persist.  She is going to physical therapy she states they have been taping the elbow and also she uses ice which helps her to some extent.  She has a follow-up with Dr. Jeannie Fend in April.  None of the other joints are painful.  She states sometimes she notices swelling over her elbows and her left wrist joint.  She notices a rash on her right forearm which comes and goes about twice a year.  She states that rash resolves with the use of cortisone cream.  She has history of plantar fasciitis in the past.  She denies any history of Achilles tendinitis or psoriasis.  She is gravida 2, para 2, miscarriages 0.  There is no family history of autoimmune disease.  Activities of Daily Living:  Patient reports morning stiffness for 1 hour.   Patient Denies nocturnal pain.  Difficulty dressing/grooming: Reports Difficulty climbing stairs: Reports Difficulty getting out of chair: Reports Difficulty using hands for taps, buttons, cutlery, and/or writing: Reports  Review of Systems  Constitutional: Positive for fatigue. Negative for night sweats, weight gain and weight loss.  HENT: Negative for mouth sores, trouble swallowing, trouble swallowing, mouth dryness and nose dryness.   Eyes: Positive for dryness. Negative for pain, redness and  visual disturbance.  Respiratory: Negative for cough, shortness of breath and difficulty breathing.   Cardiovascular: Negative for chest pain, palpitations, hypertension, irregular heartbeat and swelling in legs/feet.  Gastrointestinal: Negative for blood in stool, constipation and diarrhea.  Endocrine: Positive for increased urination.  Genitourinary: Positive for difficulty urinating and painful urination. Negative for vaginal dryness.       UTI  Musculoskeletal: Positive for arthralgias, gait problem, joint pain, joint swelling, muscle weakness, morning stiffness and muscle tenderness. Negative for myalgias  and myalgias.  Skin: Positive for rash. Negative for color change, hair loss, skin tightness, ulcers and sensitivity to sunlight.  Allergic/Immunologic: Positive for susceptible to infections.  Neurological: Positive for numbness. Negative for dizziness, memory loss, night sweats and weakness.  Hematological: Positive for bruising/bleeding tendency. Negative for swollen glands.  Psychiatric/Behavioral: Positive for sleep disturbance. Negative for depressed mood. The patient is not nervous/anxious.     PMFS History:  Patient Active Problem List   Diagnosis Date Noted  . Generalized anxiety disorder 02/02/2018  . 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 Acquired hypothyroidism 02/02/2018  . Elevated cholesterol 02/02/2018    Past Medical History:  Diagnosis Date  . Allergy   . Anxiety   . Complication of anesthesia    " wakes up  crying"  . Depression   . Eczema   . GERD (gastroesophageal reflux disease)   . Hypothyroidism   . Interstitial cystitis   . Menorrhagia   . Migraines   . PONV (postoperative nausea and vomiting)    likes phenergan and scopolamine patch  . Tennis elbow   . Tennis elbow   . Thyroid disease   . Trigger finger of right hand   . Urticaria     Family History  Problem Relation Age of Onset  . Thyroid disease Mother   . Hypertension Father   . Hyperlipidemia Father   . Cancer Maternal Grandmother   . Diabetes Maternal Grandmother   . Hypertension Maternal Grandmother   . Heart disease Maternal Grandfather   . Hyperlipidemia Maternal Grandfather   . Hypertension Maternal Grandfather   . Diabetes Paternal Grandmother   . Stroke Paternal Grandmother   . Diabetes Paternal Grandfather   . Heart disease Paternal Grandfather   . Hyperlipidemia Paternal Grandfather   . Hypertension Paternal Grandfather    Past Surgical History:  Procedure Laterality Date  . carpal tunnel release Bilateral 2010   tennis elbow also done  . CESAREAN SECTION     x 2  .  CHOLECYSTECTOMY  2003  . DIAGNOSTIC LAPAROSCOPY  20002   thru belly button to check for endometriosis  . DILITATION & CURRETTAGE/HYSTROSCOPY WITH NOVASURE ABLATION N/A 11/18/2019   Procedure: DILATATION & CURETTAGE/HYSTEROSCOPY WITH NOVASURE ABLATION;  Surgeon: Brien Few, MD;  Location: [facility];  Service: Gynecology;  Laterality: N/A;  . ELBOW SURGERY     Social History   Social History Narrative  . Not on file   Immunization History  Administered Date(s) Administered  . Influenza,inj,Quad PF,6+ Mos 11/17/2015, 11/18/2016  . Influenza-Unspecified 12/08/2017  . Moderna Sars-Covid-2 Vaccination 02/04/2020  . PFIZER Comirnaty(Gray Top)Covid-19 Tri-Sucrose Vaccine 04/12/2019, 05/03/2019     Objective: Vital Signs: BP 127/72 (BP Location: Right Arm, Patient Position: Sitting, Cuff Size: Normal)   Pulse 86   Ht 5' 6.75" (1.695 m)   Wt 189 lb (85.7 kg)   BMI 29.82 kg/m    Physical Exam Vitals and nursing note reviewed.  Constitutional:      Appearance: She is well-developed.  HENT:     Head: Normocephalic and atraumatic.  Eyes:     Conjunctiva/sclera: Conjunctivae normal.  Cardiovascular:     Rate and Rhythm: Normal rate and regular rhythm.     Heart sounds: Normal heart sounds.  Pulmonary:     Effort: Pulmonary effort is normal.     Breath sounds: Normal breath sounds.  Abdominal:     General: Bowel sounds are normal.     Palpations: Abdomen is soft.  Musculoskeletal:     Cervical back: Normal range of motion.  Lymphadenopathy:     Cervical: No cervical adenopathy.  Skin:    General: Skin is warm and dry.     Capillary Refill: Capillary refill takes less than 2 seconds.  Neurological:     Mental Status: She is alert and oriented to person, place, and time.  Psychiatric:        Behavior: Behavior normal.      Musculoskeletal Exam: C-spine was in good range of motion.  She had discomfort range of motion of of her lumbar spine without any  point tenderness.  She had tenderness on palpation of bilateral SI joints.  Shoulder joints were in good range of motion.  She had limited extension of her left elbow joint with discomfort on palpation over left elbow joint and left lateral epicondyle.  She had swelling over the left wrist joint with limited range of motion.  There is no tenderness over MCPs PIPs or DIPs.  Hip joints in good range of motion with tenderness over bilateral trochanteric bursa.  She had good range of motion of bilateral knee joints with tenderness over the medial aspect of her knee joints.  No warmth swelling effusion was noted.  There was no tenderness over ankles or MTPs.  There was no evidence of plantar fasciitis or Achilles tendinitis.  CDAI Exam: CDAI Score: - Patient Global: -; Provider Global: - Swollen: -; Tender: - Joint Exam 05/04/2020   No joint exam has been documented for this visit   There is currently no information documented on the homunculus. Go to the Rheumatology activity and complete the homunculus joint exam.  Investigation: No additional findings.  Imaging: No results found.  Recent Labs: Lab Results  Component Value Date   WBC 6.6 11/18/2019   HGB 13.8 11/18/2019   PLT 315 11/18/2019   NA 141 02/02/2018   K 4.1 02/02/2018   CL 102 02/02/2018   CO2 24 02/02/2018   GLUCOSE 96 02/02/2018   BUN 12 02/02/2018   CREATININE 0.93 02/02/2018   BILITOT 0.3 02/02/2018   ALKPHOS 119 (H) 02/02/2018   AST 28 02/02/2018   ALT 53 (H) 02/02/2018   PROT 7.2 02/02/2018   ALBUMIN 4.6 02/02/2018   CALCIUM 9.4 02/02/2018   GFRAA 84 02/02/2018   March 13, 2018 hepatitis B-, hepatitis C negative Speciality Comments: No specialty comments available.  Procedures:  No procedures performed Allergies: Augmentin [amoxicillin-pot clavulanate], Other, Penicillins, Sulfa antibiotics, Vibramycin [doxycycline], Doxycycline calcium, and Latex   Assessment / Plan:     Visit Diagnoses:  Polyarthralgia-she complains of pain and discomfort in multiple joints.  Anti-RNP antibodies present - 03/13/18: ANA+, Smooth muscle ab 12, mitochondrial ab<20, dsDNA 2, RNP 2.8, Sm ab-, Ro-, La-, thyroperoxidase ab 28 -patient has positive RNP antibody 2 years ago.  There is no history of oral ulcers, nasal ulcers, malar rash, lymphadenopathy, Raynaud's phenomenon.  She gives history of dry eyes and inflammatory arthritis.  I will obtain additional labs today.  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 Jeannie Fend.  She has had cortisone injection and also going through physical therapy.  Pain in both hands -she gives history of pain and discomfort in her bilateral hands.  She had synovitis in her left wrist joint with limited range of motion today.  Plan: XR Hand 2 View Right, XR Hand 2 View Left, Sedimentation rate, Rheumatoid factor, Cyclic citrul peptide antibody, IgG, Anti-DNA antibody, double-stranded, Anti-scleroderma antibody  Chronic SI joint pain -she complains of discomfort in her bilateral SI joints.  She states she is very stiff in the morning and has difficulty bending over.  Plan: XR Pelvis 1-2 Views, HLA-B27 antigen  Chronic midline low back pain without sciatica -she gives history of lower back pain which radiates to her SI joints.  Plan: XR Lumbar Spine 2-3 Views  Trochanteric bursitis of both hips-she had pain and tenderness on palpation of bilateral trochanteric bursa.  Other fatigue - Plan: CBC with Differential/Platelet, COMPLETE METABOLIC PANEL WITH GFR, CK, TSH, Glucose 6 phosphate dehydrogenase  High risk medication use -in anticipation to start her on immunosuppressive therapy I will obtain following labs.  She had hepatitis B and hepatitis C test in February  2020 which were negative.  Plan: HIV Antibody (routine testing w rflx), QuantiFERON-TB Gold Plus, Serum protein electrophoresis with reflex, IgG, IgA, IgM  Myofascial pain-she appears to have a component of myofascial pain syndrome with generalized pain in multiple positive tender points.  Allergic urticaria-treated by Dr. Neldon Mc.  Acquired hypothyroidism  Generalized anxiety disorder  Elevated cholesterol  Family history of systemic lupus-first cousin  Family history of rheumatoid arthritis-great-grandmother  Orders: Orders Placed This Encounter  Procedures  . XR Pelvis 1-2 Views  . XR Lumbar Spine 2-3 Views  . XR Hand 2 View Right  . XR Hand 2 View Left  . CBC with Differential/Platelet  . COMPLETE METABOLIC PANEL WITH GFR  . CK  . TSH  . Sedimentation rate  . Urinalysis, Routine w reflex microscopic  . Rheumatoid factor  . Cyclic citrul peptide antibody, IgG  . ANA  . RNP Antibody  . C3 and C4  . HLA-B27 antigen  . Glucose 6 phosphate dehydrogenase  . HIV Antibody (routine testing w rflx)  . QuantiFERON-TB Gold Plus  . Serum protein electrophoresis with reflex  . IgG, IgA, IgM  . Anti-DNA antibody, double-stranded  . Anti-scleroderma antibody   No orders of the defined types were placed in this encounter.    Follow-Up Instructions: Return for +RNP.   Bo Merino, MD  Note - This record has been created using Editor, commissioning.  Chart creation errors have been sought, but may not always  have been located. Such creation errors do not reflect on  the standard of medical care.

## 2020-05-04 ENCOUNTER — Encounter: Payer: Self-pay | Admitting: Rheumatology

## 2020-05-04 ENCOUNTER — Ambulatory Visit: Payer: BC Managed Care – PPO | Admitting: Rheumatology

## 2020-05-04 ENCOUNTER — Ambulatory Visit: Payer: Self-pay

## 2020-05-04 ENCOUNTER — Other Ambulatory Visit: Payer: Self-pay

## 2020-05-04 VITALS — BP 127/72 | HR 86 | Ht 66.75 in | Wt 189.0 lb

## 2020-05-04 DIAGNOSIS — G8929 Other chronic pain: Secondary | ICD-10-CM | POA: Diagnosis not present

## 2020-05-04 DIAGNOSIS — M7712 Lateral epicondylitis, left elbow: Secondary | ICD-10-CM | POA: Diagnosis not present

## 2020-05-04 DIAGNOSIS — M79641 Pain in right hand: Secondary | ICD-10-CM | POA: Diagnosis not present

## 2020-05-04 DIAGNOSIS — Z79899 Other long term (current) drug therapy: Secondary | ICD-10-CM

## 2020-05-04 DIAGNOSIS — R5383 Other fatigue: Secondary | ICD-10-CM

## 2020-05-04 DIAGNOSIS — R768 Other specified abnormal immunological findings in serum: Secondary | ICD-10-CM | POA: Diagnosis not present

## 2020-05-04 DIAGNOSIS — M5459 Other low back pain: Secondary | ICD-10-CM

## 2020-05-04 DIAGNOSIS — M7061 Trochanteric bursitis, right hip: Secondary | ICD-10-CM

## 2020-05-04 DIAGNOSIS — L500 Allergic urticaria: Secondary | ICD-10-CM

## 2020-05-04 DIAGNOSIS — E7800 Pure hypercholesterolemia, unspecified: Secondary | ICD-10-CM

## 2020-05-04 DIAGNOSIS — Z840 Family history of diseases of the skin and subcutaneous tissue: Secondary | ICD-10-CM

## 2020-05-04 DIAGNOSIS — E039 Hypothyroidism, unspecified: Secondary | ICD-10-CM

## 2020-05-04 DIAGNOSIS — M5388 Other specified dorsopathies, sacral and sacrococcygeal region: Secondary | ICD-10-CM | POA: Diagnosis not present

## 2020-05-04 DIAGNOSIS — M545 Low back pain, unspecified: Secondary | ICD-10-CM

## 2020-05-04 DIAGNOSIS — F411 Generalized anxiety disorder: Secondary | ICD-10-CM

## 2020-05-04 DIAGNOSIS — M7918 Myalgia, other site: Secondary | ICD-10-CM

## 2020-05-04 DIAGNOSIS — Z8261 Family history of arthritis: Secondary | ICD-10-CM

## 2020-05-04 DIAGNOSIS — M2550 Pain in unspecified joint: Secondary | ICD-10-CM

## 2020-05-04 DIAGNOSIS — M7062 Trochanteric bursitis, left hip: Secondary | ICD-10-CM

## 2020-05-04 DIAGNOSIS — M533 Sacrococcygeal disorders, not elsewhere classified: Secondary | ICD-10-CM

## 2020-05-04 DIAGNOSIS — M79642 Pain in left hand: Secondary | ICD-10-CM | POA: Diagnosis not present

## 2020-05-08 LAB — CBC WITH DIFFERENTIAL/PLATELET
Absolute Monocytes: 764 cells/uL (ref 200–950)
Basophils Absolute: 50 cells/uL (ref 0–200)
Basophils Relative: 0.6 %
Eosinophils Absolute: 59 cells/uL (ref 15–500)
Eosinophils Relative: 0.7 %
HCT: 43.7 % (ref 35.0–45.0)
Hemoglobin: 14.3 g/dL (ref 11.7–15.5)
Lymphs Abs: 1621 cells/uL (ref 850–3900)
MCH: 28.9 pg (ref 27.0–33.0)
MCHC: 32.7 g/dL (ref 32.0–36.0)
MCV: 88.5 fL (ref 80.0–100.0)
MPV: 9.7 fL (ref 7.5–12.5)
Monocytes Relative: 9.1 %
Neutro Abs: 5905 cells/uL (ref 1500–7800)
Neutrophils Relative %: 70.3 %
Platelets: 356 10*3/uL (ref 140–400)
RBC: 4.94 10*6/uL (ref 3.80–5.10)
RDW: 13.1 % (ref 11.0–15.0)
Total Lymphocyte: 19.3 %
WBC: 8.4 10*3/uL (ref 3.8–10.8)

## 2020-05-08 LAB — COMPLETE METABOLIC PANEL WITH GFR
AG Ratio: 1.8 (calc) (ref 1.0–2.5)
ALT: 24 U/L (ref 6–29)
AST: 18 U/L (ref 10–35)
Albumin: 4.4 g/dL (ref 3.6–5.1)
Alkaline phosphatase (APISO): 117 U/L (ref 37–153)
BUN: 11 mg/dL (ref 7–25)
CO2: 20 mmol/L (ref 20–32)
Calcium: 9.5 mg/dL (ref 8.6–10.4)
Chloride: 107 mmol/L (ref 98–110)
Creat: 0.86 mg/dL (ref 0.50–1.05)
GFR, Est African American: 91 mL/min/{1.73_m2} (ref >=60)
GFR, Est Non African American: 78 mL/min/{1.73_m2} (ref >=60)
Globulin: 2.5 g/dL (calc) (ref 1.9–3.7)
Glucose, Bld: 88 mg/dL (ref 65–99)
Potassium: 4.1 mmol/L (ref 3.5–5.3)
Sodium: 141 mmol/L (ref 135–146)
Total Bilirubin: 0.3 mg/dL (ref 0.2–1.2)
Total Protein: 6.9 g/dL (ref 6.1–8.1)

## 2020-05-08 LAB — PROTEIN ELECTROPHORESIS, SERUM, WITH REFLEX
Albumin ELP: 4.2 g/dL (ref 3.8–4.8)
Alpha 1: 0.3 g/dL (ref 0.2–0.3)
Alpha 2: 0.8 g/dL (ref 0.5–0.9)
Beta 2: 0.4 g/dL (ref 0.2–0.5)
Beta Globulin: 0.5 g/dL (ref 0.4–0.6)
Gamma Globulin: 0.9 g/dL (ref 0.8–1.7)
Total Protein: 7.1 g/dL (ref 6.1–8.1)

## 2020-05-08 LAB — ANTI-SCLERODERMA ANTIBODY: Scleroderma (Scl-70) (ENA) Antibody, IgG: <1 AI

## 2020-05-08 LAB — ANA: Anti Nuclear Antibody (ANA): POSITIVE — AB

## 2020-05-08 LAB — GLUCOSE 6 PHOSPHATE DEHYDROGENASE: G-6PDH: 16.5 U/g Hgb (ref 7.0–20.5)

## 2020-05-08 LAB — ANTI-DNA ANTIBODY, DOUBLE-STRANDED: ds DNA Ab: 1 IU/mL

## 2020-05-08 LAB — QUANTIFERON-TB GOLD PLUS
Mitogen-NIL: >10 IU/mL
NIL: 0.03 IU/mL
QuantiFERON-TB Gold Plus: NEGATIVE
TB1-NIL: 0 IU/mL
TB2-NIL: 0 IU/mL

## 2020-05-08 LAB — CYCLIC CITRUL PEPTIDE ANTIBODY, IGG: Cyclic Citrullin Peptide Ab: <16 UNITS

## 2020-05-08 LAB — IGG, IGA, IGM
IgG (Immunoglobin G), Serum: 1223 mg/dL (ref 600–1640)
IgM, Serum: 190 mg/dL (ref 50–300)
Immunoglobulin A: 289 mg/dL (ref 47–310)

## 2020-05-08 LAB — URINALYSIS, ROUTINE W REFLEX MICROSCOPIC
Bilirubin Urine: NEGATIVE
Glucose, UA: NEGATIVE
Hgb urine dipstick: NEGATIVE
Ketones, ur: NEGATIVE
Leukocytes,Ua: NEGATIVE
Nitrite: NEGATIVE
Protein, ur: NEGATIVE
Specific Gravity, Urine: 1.01 (ref 1.001–1.03)
pH: <=5 (ref 5.0–8.0)

## 2020-05-08 LAB — SEDIMENTATION RATE: Sed Rate: 11 mm/h (ref 0–30)

## 2020-05-08 LAB — HLA-B27 ANTIGEN: HLA-B27 Antigen: NEGATIVE

## 2020-05-08 LAB — ANTI-NUCLEAR AB-TITER (ANA TITER): ANA Titer 1: 1:80 {titer} — ABNORMAL HIGH

## 2020-05-08 LAB — RHEUMATOID FACTOR: Rheumatoid fact SerPl-aCnc: <14 IU/mL (ref <14)

## 2020-05-08 LAB — CK: Total CK: 128 U/L (ref 29–143)

## 2020-05-08 LAB — C3 AND C4
C3 Complement: 153 mg/dL (ref 83–193)
C4 Complement: 37 mg/dL (ref 15–57)

## 2020-05-08 LAB — TSH: TSH: 1.72 mIU/L

## 2020-05-08 LAB — RNP ANTIBODY: Ribonucleic Protein(ENA) Antibody, IgG: 3.6 AI — AB

## 2020-05-08 LAB — HIV ANTIBODY (ROUTINE TESTING W REFLEX): HIV 1&2 Ab, 4th Generation: NONREACTIVE

## 2020-05-11 ENCOUNTER — Other Ambulatory Visit: Payer: Self-pay | Admitting: Allergy and Immunology

## 2020-05-28 NOTE — Progress Notes (Signed)
Follow Up Note  RE: [name] MRN: [id number] DOB: 1970-10-03 Date of Office Visit: 05/29/2020  Referring provider: Brien Few, MD Primary care provider: Brien Few, MD  Chief Complaint: Allergic Reaction  History of Present Illness: I had the pleasure of seeing [name] for a follow up visit at the [facility] on 05/29/2020. She is a 50 y.o. female, who is being followed for allergic rhino conjunctivitis, hymenoptera reaction. Her previous allergy office visit was on 03/10/2020 with Dr. Neldon Mc. Today is a new complaint visit of rash.  Patient has tennis elbow on the left arm and using rock tape for this. She went to PT on Monday and they used the rock tape on her arm. She noted it was done really tight. When she unwrapped it she some redness on her arm and some swelling. She had ortho appointment on Tuesday - she pulled the tape off and noted some blistering on her left forearm as well.   Patient is having a brown recluse spider issues in their home. Patient thinks she may have been bit by a spider but she is not sure as the area was covered with rock tape but she did find a dead spider in her bed one night.   She noted that swelling and blistering is getting worse. Describes the area as itching and burning. No oozing.  Patient used some neosporin and hydrocortisone cram. Also been taking benadryl.  No fevers/chills.   Not sure if she ever took keflex in the past.  Assessment and Plan: [name] is a 50 y.o. female with: Rash and other nonspecific skin eruption Rash on left forearm - questionable reaction to rock tape vs spider bite.  On exam, no bite marks noted. Does not seem to be infected. Concern if the tape was bound too tight and when the tape was pulled off it pulled some epithelial cells and patient may have irritated it with using OTC creams.  Start prednisone taper. Prednisone 10mg  tablets - take 2 tablets for 4 days then 1 tablet  on day 5.   If the rash does not improve with prednisone then start keflex 500mg  twice a day x 7 days.   Let us know if it gets worse.  Take pictures of the rash - marked the edges of the rash in the office.   May take benadryl 25mg  to 50mg  every 8 hours as needed for itching.   Do not use any topical creams on the area for now.   Return in about 4 months (around 09/28/2020).  Meds ordered this encounter  Medications  . cephALEXin (KEFLEX) 500 MG capsule    Sig: Take 1 capsule (500 mg total) by mouth 2 (two) times daily. For 7 days.    Dispense:  14 capsule    Refill:  0   Lab Orders  No laboratory test(s) ordered today    Diagnostics: None.  Medication List:  Current Outpatient Medications  Medication Sig Dispense Refill  . cephALEXin (KEFLEX) 500 MG capsule Take 1 capsule (500 mg total) by mouth 2 (two) times daily. For 7 days. 14 capsule 0  . cetirizine (ZYRTEC) 10 MG tablet Take 1 tablet (10 mg total) by mouth at bedtime. (Patient taking differently: Take 10 mg by mouth daily.) 90 tablet 3  . EPINEPHrine (AUVI-Q) 0.3 mg/0.3 mL IJ SOAJ injection Use as directed for severe allergic reaction 2 Device 2  . famotidine (PEPCID) 40 MG tablet TAKE 1 TABLET(40 MG) BY  MOUTH EVERY EVENING 90 tablet 1  . fluconazole (DIFLUCAN) 150 MG tablet Take by mouth.    . fluticasone (FLONASE) 50 MCG/ACT nasal spray Use one to two sprays in each nostril once daily as directed. 16 g 5  . hydrALAZINE (APRESOLINE) 25 MG tablet as needed.    . hydrOXYzine (ATARAX/VISTARIL) 25 MG tablet Take 1 tablet (25 mg total) by mouth 3 (three) times daily as needed. 180 tablet 4  . levothyroxine (SYNTHROID, LEVOTHROID) 100 MCG tablet Take 1 tablet (100 mcg total) by mouth daily. 90 tablet 3  . LORazepam (ATIVAN) 0.5 MG tablet Take 1-2 tablets (0.5-1 mg total) by mouth at bedtime as needed for anxiety. 30 tablet 2  . naproxen sodium (ALEVE) 220 MG tablet Take 220 mg by mouth.    . nitrofurantoin,  macrocrystal-monohydrate, (MACROBID) 100 MG capsule Take 100 mg by mouth 2 (two) times daily.    Marland Kitchen omeprazole (PRILOSEC) 40 MG capsule Take 1 capsule (40 mg total) by mouth daily. 90 capsule 2  . OVER THE COUNTER MEDICATION cbd gummies prn hs     No current facility-administered medications for this visit.   Allergies: Allergies  Allergen Reactions  . Augmentin [Amoxicillin-Pot Clavulanate] Swelling    Pt has tolerated amoxicillin prior Black rings around eyes  . Other     Other reaction(s): Chest Pain  . Penicillins     Other reaction(s): Eye Swelling, Headache  . Sulfa Antibiotics Shortness Of Breath    Other reaction(s): Unknown  . Vibramycin [Doxycycline]     Other reaction(s): Edema, Edema, Unknown  . Doxycycline Calcium Swelling    Retained fluid  . Latex Rash   I reviewed her past medical history, social history, family history, and environmental history and no significant changes have been reported from her previous visit.  Review of Systems  Constitutional: Negative for appetite change, chills, fever and unexpected weight change.  HENT: Negative for congestion and rhinorrhea.   Eyes: Negative for itching.  Respiratory: Negative for cough, chest tightness, shortness of breath and wheezing.   Gastrointestinal: Negative for abdominal pain.  Skin: Positive for rash.  Allergic/Immunologic: Positive for environmental allergies.  Neurological: Negative for headaches.   Objective: There were no vitals taken for this visit. There is no height or weight on file to calculate BMI. Physical Exam Vitals and nursing note reviewed.  Constitutional:      Appearance: Normal appearance. She is well-developed.  HENT:     Head: Normocephalic and atraumatic.     Right Ear: Tympanic membrane and external ear normal.     Left Ear: Tympanic membrane and external ear normal.     Nose: Nose normal.     Mouth/Throat:     Mouth: Mucous membranes are moist.     Pharynx: Oropharynx is  clear.  Eyes:     Conjunctiva/sclera: Conjunctivae normal.  Cardiovascular:     Rate and Rhythm: Normal rate and regular rhythm.     Heart sounds: Normal heart sounds. No murmur heard.   Pulmonary:     Effort: Pulmonary effort is normal.     Breath sounds: Normal breath sounds. No wheezing, rhonchi or rales.  Musculoskeletal:     Cervical back: Neck supple.  Skin:    General: Skin is warm.     Findings: Rash present.     Comments: Right forearm circular erythema with areas of skin breakage. No oozing and not warm to the touch.  Neurological:     Mental Status: She is alert and  oriented to person, place, and time.  Psychiatric:        Behavior: Behavior normal.    Previous notes and tests were reviewed. The plan was reviewed with the patient/family, and all questions/concerned were addressed.  It was my pleasure to see [name] today and participate in her care. Please feel free to contact me with any questions or concerns.  Sincerely,  Rexene Alberts, DO Allergy & Immunology  [facility] [place] office: [place] office: [phone]

## 2020-05-29 ENCOUNTER — Ambulatory Visit: Payer: BC Managed Care – PPO | Admitting: Allergy

## 2020-05-29 ENCOUNTER — Other Ambulatory Visit: Payer: Self-pay

## 2020-05-29 ENCOUNTER — Encounter: Payer: Self-pay | Admitting: Allergy

## 2020-05-29 DIAGNOSIS — H1013 Acute atopic conjunctivitis, bilateral: Secondary | ICD-10-CM | POA: Diagnosis not present

## 2020-05-29 DIAGNOSIS — T63441D Toxic effect of venom of bees, accidental (unintentional), subsequent encounter: Secondary | ICD-10-CM | POA: Diagnosis not present

## 2020-05-29 DIAGNOSIS — J3089 Other allergic rhinitis: Secondary | ICD-10-CM | POA: Diagnosis not present

## 2020-05-29 DIAGNOSIS — R21 Rash and other nonspecific skin eruption: Secondary | ICD-10-CM

## 2020-05-29 MED ORDER — CEPHALEXIN 500 MG PO CAPS: 500.0000 mg | ORAL_CAPSULE | Freq: Two times a day (BID) | ORAL | 0 refills | Status: DC

## 2020-05-29 NOTE — Patient Instructions (Addendum)
Rash:  Start prednisone taper. Prednisone 10mg  tablets - take 2 tablets for 4 days then 1 tablet on day 5.   If the rash does not improve with prednisone then start keflex 500mg  twice a day x 7 days.   Let us know if it gets worse.  Take pictures of the rash.  May take benadryl 25mg  to 50mg  every 8 hours as needed for itching.    Do not use any topical creams on the area for now.   Keep appointment with Dr. Neldon Mc in August.

## 2020-05-29 NOTE — Assessment & Plan Note (Signed)
Rash on left forearm - questionable reaction to rock tape vs spider bite.  On exam, no bite marks noted. Does not seem to be infected. Concern if the tape was bound too tight and when the tape was pulled off it pulled some epithelial cells and patient may have irritated it with using OTC creams.  Start prednisone taper. Prednisone 10mg  tablets - take 2 tablets for 4 days then 1 tablet on day 5.   If the rash does not improve with prednisone then start keflex 500mg  twice a day x 7 days.   Let us know if it gets worse.  Take pictures of the rash - marked the edges of the rash in the office.   May take benadryl 25mg  to 50mg  every 8 hours as needed for itching.   Do not use any topical creams on the area for now.

## 2020-06-06 NOTE — Progress Notes (Signed)
Office Visit Note  Patient: [name]             Date of Birth: October 13, 1970           MRN: [id number]             PCP: Brien Few, MD Referring: Shawnee Knapp, MD Visit Date: 06/09/2020 Occupation: @GUAROCC @  Subjective:  Joint pain and swelling.   History of Present Illness: [name] is a 50 y.o. female with history of sicca symptoms, joint pain and inflammatory arthritis.  She states that that she has been going for physical therapy which has been helpful to some extent.  She continues to have discomfort over the left lateral epicondyle region.  She also has discomfort in the right trochanteric area and right piriformis area.  She complains of left trapezius spasm.  She developed allergic response to the tape applied by the physical therapist.  She states she had to see her allergist and advised short course of prednisone taper.  It subsided joint inflammation in her hands.  She gives history of fatigue, myalgias and dry mouth.  There is no history of malar rash, Raynaud's phenomenon, photosensitivity or lymphadenopathy.  Activities of Daily Living:  Patient reports morning stiffness for 30-45 minutes.   Patient Reports nocturnal pain.  Difficulty dressing/grooming: Denies Difficulty climbing stairs: Reports Difficulty getting out of chair: Denies Difficulty using hands for taps, buttons, cutlery, and/or writing: Denies  Review of Systems  Constitutional: Positive for fatigue. Negative for night sweats, weight gain and weight loss.  HENT: Positive for mouth dryness. Negative for mouth sores, trouble swallowing, trouble swallowing and nose dryness.   Eyes: Negative for pain, redness, itching, visual disturbance and dryness.  Respiratory: Negative for cough, shortness of breath and difficulty breathing.   Cardiovascular: Negative for chest pain, palpitations, hypertension, irregular heartbeat and swelling in legs/feet.  Gastrointestinal: Negative for blood in  stool, constipation and diarrhea.  Endocrine: Negative for increased urination.  Genitourinary: Negative for difficulty urinating and vaginal dryness.  Musculoskeletal: Positive for arthralgias, joint pain, joint swelling, myalgias, morning stiffness, muscle tenderness and myalgias. Negative for muscle weakness.  Skin: Negative for color change, rash, hair loss, redness, skin tightness, ulcers and sensitivity to sunlight.  Allergic/Immunologic: Positive for susceptible to infections.  Neurological: Positive for numbness and headaches. Negative for dizziness, memory loss, night sweats and weakness.  Hematological: Positive for bruising/bleeding tendency. Negative for swollen glands.  Psychiatric/Behavioral: Positive for sleep disturbance. Negative for depressed mood and confusion. The patient is nervous/anxious.     PMFS History:  Patient Active Problem List   Diagnosis Date Noted  . Rash and other nonspecific skin eruption 05/29/2020  . Generalized anxiety disorder 02/02/2018  . 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 Acquired hypothyroidism 02/02/2018  . Elevated cholesterol 02/02/2018    Past Medical History:  Diagnosis Date  . Allergy   . Anxiety   . Complication of anesthesia    " wakes up  crying"  . Depression   . Eczema   . GERD (gastroesophageal reflux disease)   . Hypothyroidism   . Interstitial cystitis   . Menorrhagia   . Migraines   . PONV (postoperative nausea and vomiting)    likes phenergan and scopolamine patch  . Tennis elbow   . Tennis elbow   . Thyroid disease   . Trigger finger of right hand   . Urticaria     Family History  Problem Relation Age of Onset  . Thyroid disease Mother   .  Hypertension Father   . Hyperlipidemia Father   . Cancer Maternal Grandmother   . Diabetes Maternal Grandmother   . Hypertension Maternal Grandmother   . Heart disease Maternal Grandfather   . Hyperlipidemia Maternal Grandfather   . Hypertension Maternal Grandfather   .  Diabetes Paternal Grandmother   . Stroke Paternal Grandmother   . Diabetes Paternal Grandfather   . Heart disease Paternal Grandfather   . Hyperlipidemia Paternal Grandfather   . Hypertension Paternal Grandfather    Past Surgical History:  Procedure Laterality Date  . carpal tunnel release Bilateral 2010   tennis elbow also done  . CESAREAN SECTION     x 2  . CHOLECYSTECTOMY  2003  . DIAGNOSTIC LAPAROSCOPY  20002   thru belly button to check for endometriosis  . DILITATION & CURRETTAGE/HYSTROSCOPY WITH NOVASURE ABLATION N/A 11/18/2019   Procedure: DILATATION & CURETTAGE/HYSTEROSCOPY WITH NOVASURE ABLATION;  Surgeon: Brien Few, MD;  Location: [facility];  Service: Gynecology;  Laterality: N/A;  . ELBOW SURGERY     Social History   Social History Narrative  . Not on file   Immunization History  Administered Date(s) Administered  . Influenza,inj,Quad PF,6+ Mos 11/17/2015, 11/18/2016  . Influenza-Unspecified 12/08/2017  . Moderna Sars-Covid-2 Vaccination 02/04/2020  . PFIZER Comirnaty(Gray Top)Covid-19 Tri-Sucrose Vaccine 04/12/2019, 05/03/2019     Objective: Vital Signs: BP 115/81 (BP Location: Right Arm, Patient Position: Sitting, Cuff Size: Normal)   Pulse 91   Resp 15   Ht $R'5\' 7"'AR$  (1.702 m)   Wt 190 lb 9.6 oz (86.5 kg)   BMI 29.85 kg/m    Physical Exam Vitals and nursing note reviewed.  Constitutional:      Appearance: She is well-developed.  HENT:     Head: Normocephalic and atraumatic.  Eyes:     Conjunctiva/sclera: Conjunctivae normal.  Cardiovascular:     Rate and Rhythm: Normal rate and regular rhythm.     Heart sounds: Normal heart sounds.  Pulmonary:     Effort: Pulmonary effort is normal.     Breath sounds: Normal breath sounds.  Abdominal:     General: Bowel sounds are normal.     Palpations: Abdomen is soft.  Musculoskeletal:     Cervical back: Normal range of motion.  Lymphadenopathy:     Cervical: No cervical adenopathy.   Skin:    General: Skin is warm and dry.     Capillary Refill: Capillary refill takes less than 2 seconds.  Neurological:     Mental Status: She is alert and oriented to person, place, and time.  Psychiatric:        Behavior: Behavior normal.      Musculoskeletal Exam: C-spine thoracic and lumbar spine were in good range of motion.  She had bilateral trapezius spasm.  She had tenderness on palpation or right trochanteric bursa and right piriformis region.  Shoulder joints, elbow joints, wrist joints, MCPs PIPs and DIPs with good range of motion with no synovitis.  Hip joints, knee joints, ankles, MTPs and PIPs with good range of motion with no synovitis.  CDAI Exam: CDAI Score: -- Patient Global: --; Provider Global: -- Swollen: --; Tender: -- Joint Exam 06/09/2020   No joint exam has been documented for this visit   There is currently no information documented on the homunculus. Go to the Rheumatology activity and complete the homunculus joint exam.  Investigation: No additional findings.  Imaging: XR Hand 2 View Left  Result Date: 06/06/2020 Mild CMC, and PIP narrowing was  noted.  No MCP, intercarpal or radiocarpal joint space narrowing was noted.  No erosive changes were noted. 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  XR Hand 2 View Right  Result Date: 06/06/2020 Mild CMC, and PIP narrowing was noted.  No MCP, intercarpal or radiocarpal joint space narrowing was noted.  No erosive changes were noted. 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  XR Lumbar Spine 2-3 Views  Result Date: 06/06/2020 Dextroscoliosis was noted.  Anterior spurring was noted.  Facet joint arthropathy was noted.  No significant disc space narrowing was noted. Impression: These findings are consistent with dextroscoliosis, facet joint arthropathy and degenerative changes.  XR Pelvis 1-2 Views  Result Date: 06/06/2020 No SI joint to sclerosis or  narrowing was noted. Impression: Unremarkable x-ray of the SI joints.   Recent Labs: Lab Results  Component Value Date   WBC 8.4 05/04/2020   HGB 14.3 05/04/2020   PLT 356 05/04/2020   NA 141 05/04/2020   K 4.1 05/04/2020   CL 107 05/04/2020   CO2 20 05/04/2020   GLUCOSE 88 05/04/2020   BUN 11 05/04/2020   CREATININE 0.86 05/04/2020   BILITOT 0.3 05/04/2020   ALKPHOS 119 (H) 02/02/2018   AST 18 05/04/2020   ALT 24 05/04/2020   PROT 6.9 05/04/2020   PROT 7.1 05/04/2020   ALBUMIN 4.6 02/02/2018   CALCIUM 9.5 05/04/2020   GFRAA 91 05/04/2020   QFTBGOLDPLUS NEGATIVE 05/04/2020   May 04, 2020 SPEP negative, immunoglobulins normal, TB Gold negative, HIV negative, G6PD normal, TSH normal, CK 128 UA negative ANA 1: 80NS, RNP 3.6, (double-stranded DNA, SCL 70 negative), C3-C4 normal, RF negative, anti-CCP negative, HLA-B27 negative, ESR 11  03/13/18: ANA+, Smooth muscle ab 12, mitochondrial ab<20, dsDNA 2, RNP 2.8, Sm ab-, Ro-, La-, thyroperoxidase ab 28  Speciality Comments: No specialty comments available.  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  Patient had synovitis in her left wrist joint at the last visit.  Lab findings were discussed at length.  Counseling regarding mixed connective tissue disease was provided.  As she is having ongoing joint pain and discomfort, we discussed different treatment options and their side effects.  She wants to proceed with hydroxychloroquine.  There is history of sulfa allergy but she does not recall having any problems with sulfa.  Indications side effects contraindications of hydroxychloroquine were discussed.  She wants to proceed with hydroxychloroquine.  She has  been advised to keep Benadryl with her and try a small dose may be half tablet of hydroxychloroquine and then increase it gradually to 1 tablet p.o. twice daily Monday to Friday.  If she develops any side effects she should take Benadryl and if symptoms get worse she may have to go to the emergency room.  We will check baseline eye examination and then examination on a yearly basis to monitor for ocular toxicity.  She was advised to get labs in a month, 3 months and then every 5 months to monitor for drug toxicity.  Association of pulmonary hypertension and interstitial lung disease with mixed connective tissue disease was also discussed.  She is asymptomatic currently.  I will get a baseline echocardiogram and PFTs.   Patient was counseled on the purpose, proper use, and adverse effects of hydroxychloroquine  including nausea/diarrhea, skin rash, headaches, and sun sensitivity.  Discussed importance of annual eye exams while on hydroxychloroquine to monitor to ocular toxicity and discussed importance of frequent laboratory monitoring.  Provided patient with eye exam form for baseline ophthalmologic exam.  Provided patient with educational materials on hydroxychloroquine and answered all questions.  Patient consented to hydroxychloroquine.  Will upload consent in the media tab.   High risk medication use-she will get labs in a month, 3 months and then every 5 months.  Baseline eye examination was advised.  Lateral epicondylitis, left elbow - She had cortisone injection by Dr. Jeannie Fend.  She is going to physical therapy.  She has noticed some improvement with physical therapy.  Pain in both hands - She gives history of intermittent pain. Synovitis was noted in the left wrist joint with limited range of motion at the last visit.  X-ray showed mild osteoarthritis.  She had no synovitis on examination today.  She had recent prednisone taper by her allergist as she had allergic reaction to the tape.  Chronic SI  joint pain - History of significant morning stiffness.  HLA-B27 is negative.  X-rays of SI joints were unremarkable.  She has some tenderness over the right gluteal region.  I believe the symptoms are related to myofascial pain.  Trochanteric bursitis of both hips-she had tenderness over bilateral trochanteric bursa, more prominent on the right side.  IT band stretches were demonstrated in the office.  Chronic midline low back pain without sciatica - History of lower back pain radiating to SI joints.  X-ray showed dextroscoliosis, facet joint arthropathy and some degenerative changes.  She would benefit from core strengthening exercises.  Myofascial pain - She had generalized hyperalgesia and positive tender points.  There is family history of fibromyalgia.  Need for regular exercise, water aerobics, swimming, stretching, yoga was emphasized.  Good sleep hygiene was discussed.  Other fatigue-related to insomnia.  Generalized anxiety disorder-she may benefit from anxiolytic agent.  Have advised her to discuss that further with her PCP.  She is also requesting a referral to PCP.  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  Need for regular exercise and dietary modifications were discussed.  Acquired hypothyroidism  Family history of lupus erythematosus - First cousin  Family history of rheumatoid arthritis - Great grandmother  Orders: Orders Placed This Encounter  Procedures  . Ambulatory referral to Cardiology  . Ambulatory referral to Internal Medicine   Meds ordered this encounter  Medications  . hydroxychloroquine (PLAQUENIL) 200 MG tablet    Sig: Take $RemoveB'200mg'YAgKbvGq$  by mouth twice daily Monday through Friday. None on Saturday or Sunday.    Dispense:  40 tablet    Refill:  2    Follow-Up Instructions: Return in about 6 weeks (around 07/21/2020) for MCTD.   Bo Merino, MD  Note - This  record has been created using Editor, commissioning.  Chart creation errors have been sought, but may not always  have been located. Such creation errors do not reflect on  the standard of medical care.

## 2020-06-09 ENCOUNTER — Encounter: Payer: Self-pay | Admitting: Rheumatology

## 2020-06-09 ENCOUNTER — Ambulatory Visit: Payer: BC Managed Care – PPO | Admitting: Rheumatology

## 2020-06-09 ENCOUNTER — Other Ambulatory Visit: Payer: Self-pay

## 2020-06-09 VITALS — BP 115/81 | HR 91 | Resp 15 | Ht 67.0 in | Wt 190.6 lb

## 2020-06-09 DIAGNOSIS — M79642 Pain in left hand: Secondary | ICD-10-CM

## 2020-06-09 DIAGNOSIS — R5383 Other fatigue: Secondary | ICD-10-CM

## 2020-06-09 DIAGNOSIS — Z79899 Other long term (current) drug therapy: Secondary | ICD-10-CM

## 2020-06-09 DIAGNOSIS — M79641 Pain in right hand: Secondary | ICD-10-CM

## 2020-06-09 DIAGNOSIS — Z840 Family history of diseases of the skin and subcutaneous tissue: Secondary | ICD-10-CM

## 2020-06-09 DIAGNOSIS — R768 Other specified abnormal immunological findings in serum: Secondary | ICD-10-CM | POA: Diagnosis not present

## 2020-06-09 DIAGNOSIS — M7061 Trochanteric bursitis, right hip: Secondary | ICD-10-CM

## 2020-06-09 DIAGNOSIS — E7800 Pure hypercholesterolemia, unspecified: Secondary | ICD-10-CM

## 2020-06-09 DIAGNOSIS — M533 Sacrococcygeal disorders, not elsewhere classified: Secondary | ICD-10-CM

## 2020-06-09 DIAGNOSIS — L500 Allergic urticaria: Secondary | ICD-10-CM

## 2020-06-09 DIAGNOSIS — E039 Hypothyroidism, unspecified: Secondary | ICD-10-CM

## 2020-06-09 DIAGNOSIS — M7062 Trochanteric bursitis, left hip: Secondary | ICD-10-CM

## 2020-06-09 DIAGNOSIS — M7712 Lateral epicondylitis, left elbow: Secondary | ICD-10-CM | POA: Diagnosis not present

## 2020-06-09 DIAGNOSIS — Z8261 Family history of arthritis: Secondary | ICD-10-CM

## 2020-06-09 DIAGNOSIS — M7918 Myalgia, other site: Secondary | ICD-10-CM

## 2020-06-09 DIAGNOSIS — G8929 Other chronic pain: Secondary | ICD-10-CM

## 2020-06-09 DIAGNOSIS — M545 Low back pain, unspecified: Secondary | ICD-10-CM

## 2020-06-09 DIAGNOSIS — F411 Generalized anxiety disorder: Secondary | ICD-10-CM

## 2020-06-09 MED ORDER — HYDROXYCHLOROQUINE SULFATE 200 MG PO TABS: ORAL_TABLET | ORAL | 2 refills | Status: DC

## 2020-06-09 NOTE — Patient Instructions (Signed)
Hydroxychloroquine tablets What is this medicine? HYDROXYCHLOROQUINE (hye drox ee KLOR oh kwin) is used to treat rheumatoid arthritis and systemic lupus erythematosus. It is also used to treat malaria. This medicine may be used for other purposes; ask your health care provider or pharmacist if you have questions. COMMON BRAND NAME(S): Plaquenil, Quineprox What should I tell my health care provider before I take this medicine? They need to know if you have any of these conditions:  diabetes  eye disease, vision problems  G6PD deficiency  heart disease  history of irregular heartbeat  if you often drink alcohol  kidney disease  liver disease  porphyria  psoriasis  an unusual or allergic reaction to chloroquine, hydroxychloroquine, other medicines, foods, dyes, or preservatives  pregnant or trying to get pregnant  breast-feeding How should I use this medicine? Take this medicine by mouth with a glass of water. Take it as directed on the prescription label. Do not cut, crush or chew this medicine. Swallow the tablets whole. Take it with food. Do not take it more than directed. Take all of this medicine unless your health care provider tells you to stop it early. Keep taking it even if you think you are better. Take products with antacids in them at a different time of day than this medicine. Take this medicine 4 hours before or 4 hours after antacids. Talk to your health care provider if you have questions. Talk to your pediatrician regarding the use of this medicine in children. While this drug may be prescribed for selected conditions, precautions do apply. Overdosage: If you think you have taken too much of this medicine contact a poison control center or emergency room at once. NOTE: This medicine is only for you. Do not share this medicine with others. What if I miss a dose? If you miss a dose, take it as soon as you can. If it is almost time for your next dose, take only  that dose. Do not take double or extra doses. What may interact with this medicine? Do not take this medicine with any of the following medications:  cisapride  dronedarone  pimozide  thioridazine This medicine may also interact with the following medications:  ampicillin  antacids  cimetidine  cyclosporine  digoxin  kaolin  medicines for diabetes, like insulin, glipizide, glyburide  medicines for seizures like carbamazepine, phenobarbital, phenytoin  mefloquine  methotrexate  other medicines that prolong the QT interval (cause an abnormal heart rhythm)  praziquantel This list may not describe all possible interactions. Give your health care provider a list of all the medicines, herbs, non-prescription drugs, or dietary supplements you use. Also tell them if you smoke, drink alcohol, or use illegal drugs. Some items may interact with your medicine. What should I watch for while using this medicine? Visit your health care provider for regular checks on your progress. Tell your health care provider if your symptoms do not start to get better or if they get worse. You may need blood work done while you are taking this medicine. If you take other medicines that can affect heart rhythm, you may need more testing. Talk to your health care provider if you have questions. Your vision may be tested before and during use of this medicine. Tell your health care provider right away if you have any change in your eyesight. This medicine may cause serious skin reactions. They can happen weeks to months after starting the medicine. Contact your health care provider right away if you  notice fevers or flu-like symptoms with a rash. The rash may be red or purple and then turn into blisters or peeling of the skin. Or, you might notice a red rash with swelling of the face, lips or lymph nodes in your neck or under your arms. If you or your family notice any changes in your behavior, such as new  or worsening depression, thoughts of harming yourself, anxiety, or other unusual or disturbing thoughts, or memory loss, call your health care provider right away. What side effects may I notice from receiving this medicine? Side effects that you should report to your doctor or health care professional as soon as possible:  allergic reactions (skin rash, itching or hives; swelling of the face, lips, or tongue)  changes in vision  decreased hearing, ringing in the ears  heartbeat rhythm changes (trouble breathing; chest pain; dizziness; fast, irregular heartbeat; feeling faint or lightheaded, falls)  liver injury (dark yellow or brown urine; general ill feeling or flu-like symptoms; loss of appetite, right upper belly pain; unusually weak or tired, yellowing of the eyes or skin)  low blood sugar (feeling anxious; confusion; dizziness; increased hunger; unusually weak or tired; increased sweating; shakiness; cold, clammy skin; irritable; headache; blurred vision; fast heartbeat; loss of consciousness)  low red blood cell counts (trouble breathing; feeling faint; lightheaded, falls; unusually weak or tired)  muscle weakness  pain, tingling, numbness in the hands or feet  rash, fever, and swollen lymph nodes  redness, blistering, peeling or loosening of the skin, including inside the mouth  suicidal thoughts, mood changes  uncontrollable head, mouth, neck, arm, or leg movements  unusual bruising or bleeding Side effects that usually do not require medical attention (report to your doctor or health care professional if they continue or are bothersome):  diarrhea  hair loss  irritable This list may not describe all possible side effects. Call your doctor for medical advice about side effects. You may report side effects to FDA at 1-800-FDA-1088. Where should I keep my medicine? Keep out of the reach of children and pets. Store at room temperature up to 30 degrees C (86 degrees F).  Protect from light. Get rid of any unused medicine after the expiration date. To get rid of medicines that are no longer needed or have expired:  Take the medicine to a medicine take-back program. Check with your pharmacy or law enforcement to find a location.  If you cannot return the medicine, check the label or package insert to see if the medicine should be thrown out in the garbage or flushed down the toilet. If you are not sure, ask your health care provider. If it is safe to put it in the trash, empty the medicine out of the container. Mix the medicine with cat litter, dirt, coffee grounds, or other unwanted substance. Seal the mixture in a bag or container. Put it in the trash. NOTE: This sheet is a summary. It may not cover all possible information. If you have questions about this medicine, talk to your doctor, pharmacist, or health care provider.  2021 [employer] Standard (2019-07-15 15:07:49)  Standing Labs We placed an order today for your standing lab work.   Please have your standing labs drawn in one month , 3 months then every 5 months  If possible, please have your labs drawn 2 weeks prior to your appointment so that the provider can discuss your results at your appointment.  We have open lab daily Monday through Thursday from 1:30-4:30  PM and Friday from 1:30-4:00 PM at the office of Dr. Bo Merino, [facility].   Please be advised, all patients with office appointments requiring lab work will take precedents over walk-in lab work.  If possible, please come for your lab work on Monday and Friday afternoons, as you may experience shorter wait times. The office is located at [address] [place], [place], [place] [address] No appointment is necessary.   Labs are drawn by [facility]. Please bring your co-pay at the time of your lab draw.  You may receive a bill from [facility] for your lab work.  If you wish to have your labs drawn at another location,  please call the office 24 hours in advance to send orders.  If you have any questions regarding directions or hours of operation,  please call [phone].   As a reminder, please drink plenty of water prior to coming for your lab work. Thanks!  Vaccines You are taking a medication(s) that can suppress your immune system.  The following immunizations are recommended: . Flu annually . Covid-19  . Pneumonia (Pneumovax 23 and Prevnar 13 spaced at least 1 year apart) . Shingrix (after age 12)  Please check with your PCP to make sure you are up to date.  Heart Disease Prevention   Your inflammatory disease increases your risk of heart disease which includes heart attack, stroke, atrial fibrillation (irregular heartbeats), high blood pressure, heart failure and atherosclerosis (plaque in the arteries).  It is important to reduce your risk by:   . Keep blood pressure, cholesterol, and blood sugar at healthy levels   . Smoking Cessation   . Maintain a healthy weight  o BMI 20-25   . Eat a healthy diet  o Plenty of fresh fruit, vegetables, and whole grains  o Limit saturated fats, foods high in sodium, and added sugars  o DASH and Mediterranean diet   . Increase physical activity  o Recommend moderate physically activity for 150 minutes per week/ 30 minutes a day for five days a week These can be broken up into three separate ten-minute sessions during the day.   . Reduce Stress  . Meditation, slow breathing exercises, yoga, coloring books  . Dental visits twice a year

## 2020-06-23 ENCOUNTER — Telehealth (HOSPITAL_COMMUNITY): Payer: Self-pay | Admitting: Internal Medicine

## 2020-06-23 ENCOUNTER — Telehealth: Payer: Self-pay

## 2020-06-23 DIAGNOSIS — M351 Other overlap syndromes: Secondary | ICD-10-CM

## 2020-06-23 DIAGNOSIS — M542 Cervicalgia: Secondary | ICD-10-CM | POA: Insufficient documentation

## 2020-06-23 NOTE — Telephone Encounter (Signed)
Patient called requesting a return call with the status of her referral to see a cardiologist.

## 2020-06-23 NOTE — Telephone Encounter (Signed)
Dr office called to f/u w/referral, please advise

## 2020-06-23 NOTE — Telephone Encounter (Signed)
Spoke with patient, advised I called Dr. Clayborne Dana office this morning to check referral status and we will update her once we hear. Provided patient with their phone # as well. Patient also inquired about PCP referral, advised I checked in with Dr. Nathanial Millman office on 06/18/2020 and will continue to do so once weekly. Also provided patient with Dr. Nathanial Millman office #.

## 2020-06-29 NOTE — Telephone Encounter (Signed)
Per Dr Haroldine Laws pt will need echo and pft's before appt with him, orders placed will arrange

## 2020-06-30 ENCOUNTER — Telehealth: Payer: Self-pay

## 2020-06-30 NOTE — Progress Notes (Signed)
Office Visit Note  Patient: [name]             Date of Birth: 02-10-1970           MRN: [id number]             PCP: Brien Few, MD Referring: Brien Few, MD Visit Date: 07/01/2020 Occupation: _0 @  Subjective:  Drug reaction-rash  History of Present Illness: [name] is a 50 y.o. female with history of mixed connective tissue disease.  She is taking plaquenil 200 mg 1 tablet by mouth twice daily Monday through Friday.  She was started on Plaquenil on 06/10/2020 at a reduced dose of a half a tablet twice daily for 2 days and then she increased to 1 tablet twice daily Monday through Friday.  According to the patient she initially experienced some nausea and GI upset which has since improved.  She states that after several doses of Plaquenil she started to experience a burning sensation on her chest which has been very red. She has a history of a sulfa allergy.  She has been trying to avoid direct sun exposure.  She has started to notice itching and has noticed that the rash is started to spread to her arms, neck, and anterior thighs.  She has been taking Zyrtec at bedtime chronically.  She has not tried any topical agents or Benadryl for relief.  She has continued to take Plaquenil as prescribed.   She has not noticed any clinical benefit since starting PLQ.  She continues to have joint stiffness and fatigue on a daily basis.   She is experiencing pain in both hands and continues to have intermittent joint swelling. She denies any oral or nasal ulcers.  She has a swollen lymph node on the left side of her neck and is scheduled for a MRI on 07/20/20 for further evaluation.     Activities of Daily Living:  Patient reports joint stiffness all day  Patient Reports nocturnal pain.  Difficulty dressing/grooming: Denies Difficulty climbing stairs: Reports Difficulty getting out of chair: Denies Difficulty using hands for taps, buttons, cutlery, and/or writing:  Denies  Review of Systems  Constitutional: Positive for fatigue.  HENT: Negative for mouth sores, mouth dryness and nose dryness.   Eyes: Negative for pain, itching, visual disturbance and dryness.  Respiratory: Positive for shortness of breath. Negative for cough, hemoptysis and difficulty breathing.   Cardiovascular: Positive for swelling in legs/feet. Negative for chest pain and palpitations.  Gastrointestinal: Positive for abdominal pain. Negative for blood in stool, constipation and diarrhea.  Endocrine: Negative for increased urination.  Genitourinary: Negative for painful urination.  Musculoskeletal: Positive for arthralgias, joint pain, joint swelling and morning stiffness. Negative for myalgias, muscle weakness, muscle tenderness and myalgias.  Skin: Positive for color change, rash and redness.  Allergic/Immunologic: Positive for susceptible to infections.  Neurological: Positive for dizziness and headaches. Negative for numbness, memory loss and weakness.  Hematological: Positive for swollen glands.  Psychiatric/Behavioral: Negative for confusion and sleep disturbance.    PMFS History:  Patient Active Problem List   Diagnosis Date Noted  . Rash and other nonspecific skin eruption 05/29/2020  . Generalized anxiety disorder 02/02/2018  . 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 Acquired hypothyroidism 02/02/2018  . Elevated cholesterol 02/02/2018    Past Medical History:  Diagnosis Date  . Allergy   . Anxiety   . Complication of anesthesia    " wakes up  crying"  . Depression   . Eczema   .  GERD (gastroesophageal reflux disease)   . Hypothyroidism   . Interstitial cystitis   . Ligament tear    Left elbow  . Menorrhagia   . Migraines   . PONV (postoperative nausea and vomiting)    likes phenergan and scopolamine patch  . Tennis elbow   . Tennis elbow   . Thyroid disease   . Trigger finger of right hand   . Urticaria     Family History  Problem Relation Age of Onset   . Thyroid disease Mother   . Hypertension Father   . Hyperlipidemia Father   . Cancer Maternal Grandmother   . Diabetes Maternal Grandmother   . Hypertension Maternal Grandmother   . Heart disease Maternal Grandfather   . Hyperlipidemia Maternal Grandfather   . Hypertension Maternal Grandfather   . Diabetes Paternal Grandmother   . Stroke Paternal Grandmother   . Diabetes Paternal Grandfather   . Heart disease Paternal Grandfather   . Hyperlipidemia Paternal Grandfather   . Hypertension Paternal Grandfather    Past Surgical History:  Procedure Laterality Date  . carpal tunnel release Bilateral 2010   tennis elbow also done  . CESAREAN SECTION     x 2  . CHOLECYSTECTOMY  2003  . DIAGNOSTIC LAPAROSCOPY  20002   thru belly button to check for endometriosis  . DILITATION & CURRETTAGE/HYSTROSCOPY WITH NOVASURE ABLATION N/A 11/18/2019   Procedure: DILATATION & CURETTAGE/HYSTEROSCOPY WITH NOVASURE ABLATION;  Surgeon: Brien Few, MD;  Location: [facility];  Service: Gynecology;  Laterality: N/A;  . ELBOW SURGERY     Social History   Social History Narrative  . Not on file   Immunization History  Administered Date(s) Administered  . Influenza,inj,Quad PF,6+ Mos 11/17/2015, 11/18/2016  . Influenza-Unspecified 12/08/2017  . Moderna Sars-Covid-2 Vaccination 02/04/2020  . PFIZER Comirnaty(Gray Top)Covid-19 Tri-Sucrose Vaccine 04/12/2019, 05/03/2019     Objective: Vital Signs: BP 118/77 (BP Location: Left Arm, Patient Position: Sitting, Cuff Size: Normal)   Pulse 89   Ht 5' 6.5" (1.689 m)   Wt 190 lb (86.2 kg)   BMI 30.21 kg/m    Physical Exam Vitals and nursing note reviewed.  Constitutional:      Appearance: She is well-developed.  HENT:     Head: Normocephalic and atraumatic.  Eyes:     Conjunctiva/sclera: Conjunctivae normal.  Pulmonary:     Effort: Pulmonary effort is normal.  Abdominal:     Palpations: Abdomen is soft.  Musculoskeletal:      Cervical back: Normal range of motion.  Lymphadenopathy:     Cervical: Cervical adenopathy (Left supraclavicular lymph node palpable) present.  Skin:    General: Skin is warm and dry.     Capillary Refill: Capillary refill takes less than 2 seconds.     Comments: Maculopapular rash on chest Mild diffuse erythema on back and upper arms   Neurological:     Mental Status: She is alert and oriented to person, place, and time.  Psychiatric:        Behavior: Behavior normal.      Musculoskeletal Exam: C-spine, thoracic spine, and lumbar spine good ROM.  Shoulder joints, elbow joints, wrist joints, MCPs, PIPs, and DIPs good ROM with no synovitis.  Complete fist formation bilaterally.  Hip joints, knee joints, and ankle joints good ROM with no discomfort.  No warmth or effusion of knee joints.  No tenderness or swelling of ankle joints.   CDAI Exam: CDAI Score: -- Patient Global: --; Provider Global: -- Swollen: --;  Tender: -- Joint Exam 07/01/2020   No joint exam has been documented for this visit   There is currently no information documented on the homunculus. Go to the Rheumatology activity and complete the homunculus joint exam.  Investigation: No additional findings.  Imaging: XR Hand 2 View Left  Result Date: 06/06/2020 Mild CMC, and PIP narrowing was noted.  No MCP, intercarpal or radiocarpal joint space narrowing was noted.  No erosive changes were noted. 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  XR Hand 2 View Right  Result Date: 06/06/2020 Mild CMC, and PIP narrowing was noted.  No MCP, intercarpal or radiocarpal joint space narrowing was noted.  No erosive changes were noted. 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  XR Lumbar Spine 2-3 Views  Result Date: 06/06/2020 Dextroscoliosis was noted.  Anterior spurring was noted.  Facet joint arthropathy was noted.  No significant disc space narrowing was noted.  Impression: These findings are consistent with dextroscoliosis, facet joint arthropathy and degenerative changes.  XR Pelvis 1-2 Views  Result Date: 06/06/2020 No SI joint to sclerosis or narrowing was noted. Impression: Unremarkable x-ray of the SI joints.   Recent Labs: Lab Results  Component Value Date   WBC 8.4 05/04/2020   HGB 14.3 05/04/2020   PLT 356 05/04/2020   NA 141 05/04/2020   K 4.1 05/04/2020   CL 107 05/04/2020   CO2 20 05/04/2020   GLUCOSE 88 05/04/2020   BUN 11 05/04/2020   CREATININE 0.86 05/04/2020   BILITOT 0.3 05/04/2020   ALKPHOS 119 (H) 02/02/2018   AST 18 05/04/2020   ALT 24 05/04/2020   PROT 6.9 05/04/2020   PROT 7.1 05/04/2020   ALBUMIN 4.6 02/02/2018   CALCIUM 9.5 05/04/2020   GFRAA 91 05/04/2020   QFTBGOLDPLUS NEGATIVE 05/04/2020    Speciality Comments: PLQ EYE EXAM 06/23/2020 [facility] f/u 6 months  Procedures:  No procedures performed Allergies: Augmentin [amoxicillin-pot clavulanate], Other, Penicillins, Sulfa antibiotics, Vibramycin [doxycycline], Doxycycline calcium, and Latex   Assessment / Plan:     Visit Diagnoses: Mixed connective tissue disease - Positive ANA, positive RNP, dry eyes and inflammatory arthritis: Patient was started on Plaquenil after her last office visit on 06/09/2020.  She has developed a macular papular rash on her torso which has since started to spread to her arms and anterior thighs after starting on plaquenil.  She has a known sulfa allergy, so we discussed the potential for cross reactivity.  She was advised to discontinue PLQ.  A prednisone taper starting at 20 mg tapering by 5 mg every 4 days was sent to the pharmacy.  She was advised to continue to take Zyrtec at bedtime.  If her symptoms persist or worsen she was advised to be evaluated in the emergency department. Different treatment options were discussed today in detail.  She would not be able to start any new medications until the rash has  completely resolved.  The indications, contraindications, potential side effects of Imuran were discussed today in detail.  All questions were addressed and consent was obtained.  A prescription for Imuran is pending the resolution of her rash, lab results, and a chest x-ray. She did not notice any clinical improvement since starting on Plaquenil.  She continues to have joint stiffness and pain in multiple joints.  She has a history of synovitis of the left wrist.  She continues to experience pain and stiffness in both hands.  She has ongoing fatigue as well as  intermittent facial rashes. She is currently awaiting an appointment with Dr. Haroldine Laws for a baseline echocardiogram and PFTs.  The association of pulmonary hypertension and interstitial lung disease with mixed connective tissue disease was discussed.   Baseline Immunosuppressant Therapy Labs TB GOLD Quantiferon TB Gold Latest Ref Rng & Units 05/04/2020  Quantiferon TB Gold Plus NEGATIVE NEGATIVE   Hepatitis Panel Hepatitis Latest Ref Rng & Units 03/13/2018  Hep B Surface Ag Negative Negative  Hep B IgM Negative Negative   HIV Lab Results  Component Value Date   HIV NON-REACTIVE 05/04/2020   HIV Non Reactive 02/02/2018   Immunoglobulins Immunoglobulin Electrophoresis Latest Ref Rng & Units 05/04/2020  IgA  47 - 310 mg/dL 289  IgG 600 - 1,640 mg/dL 1,223  IgM 50 - 300 mg/dL 190   SPEP Serum Protein Electrophoresis Latest Ref Rng & Units 05/04/2020  Total Protein 6.1 - 8.1 g/dL 7.1  Albumin 3.8 - 4.8 g/dL 4.2  Alpha-1 0.2 - 0.3 g/dL 0.3  Alpha-2 0.5 - 0.9 g/dL 0.8  Beta Globulin 0.4 - 0.6 g/dL 0.5  Beta 2 0.2 - 0.5 g/dL 0.4  Gamma Globulin 0.8 - 1.7 g/dL 0.9   G6PD Lab Results  Component Value Date   G6PDH 16.5 05/04/2020   TPMT: Pending     Chest Xray:Pending   Patient was counseled on the purpose, proper use, and adverse effects of azathioprine including risk of infection, nausea, rash, and hair loss.  Also informed  that medication can cause discoloration of urine, sweat and tears. Reviewed risk of cancer after long term use.  Discussed risk of myelosupression and reviewed importance of frequent lab work to monitor blood counts.  Standing orders placed. Reviewed drug-drug interactions including contraindication with allopurinol.  Provided patient with educational materials on azathioprine and answered all questions.  Patient consented to azathioprine.  Will upload consent into the media tab.   Patient dose will be 50 mg 1 tablet by mouth daily.  May increase the dose if tolerated and necessary to increase efficacy.  Prescription will be sent to pharmacy pending lab results and insurance approval.  High risk medication use -Advised to discontinue plaquenil due to drug reaction.   PLQ EYE EXAM 06/23/2020. She will be starting on Imuran 50 mg 1 tablet daily once her rash completely resolves and her lab work has resulted.  CBC, CMP, and TPMT were drawn today.   - Plan: COMPLETE METABOLIC PANEL WITH GFR, CBC with Differential/Platelet, Thiopurine methyltransferase(tpmt)rbc  Drug-induced skin rash: She presents today with a maculopapular rash on her chest and diffuse erythema on her abdomen, neck, and bilateral upper arms.  She was initially started on Plaquenil on 06/10/2020.  She has a known history of a sulfa allergy so she started on the reduced dose of Plaquenil half tablet twice daily for 2 days and then increase to 1 tablet twice daily Monday through Friday.  She initially experienced some GI upset which has since improved.  About a week after starting the medication she started to notice skin irritation and redness on her chest.  She did not have any direct sun exposure prior to the onset of symptoms.  She has not started any other new medications or products.  A macular papular rash was noted on her chest as well as diffuse erythema on her abdomen, neck, and arms was noted on examination today.  She was advised to  discontinue Plaquenil.  Prednisone taper starting at 20 mg tapering by 5 mg every 4 days was  sent to the pharmacy.  She was advised to continue to take Zyrtec at bedtime.  She was advised to notify us if the rash persists or worsens.  If she develops any other new or worsening symptoms she was advised to be evaluated in the emergency department.   Lateral epicondylitis, left elbow: She is currently going to physical therapy and is followed at [facility].   Primary osteoarthritis of both hands: She has mild PIP and DIP thickening consistent with osteoarthritis of both hands.  She continues to experience pain and stiffness in both hands.   Chronic SI joint pain - History of significant morning stiffness.  HLA-B27 is negative.  X-rays of SI joints were unremarkable.   Trochanteric bursitis of both hips: She continues to have intermittent discomfort due to trochanteric bursitis of both hips.   Chronic midline low back pain without sciatica -  X-ray showed dextroscoliosis, facet joint arthropathy and some degenerative changes.  Myofascial pain: She has positive tender points on exam.   Other fatigue: She continues to have significant fatigue on a daily basis.   Other medical conditions are listed as follows:   Generalized anxiety disorder  Allergic urticaria - By Dr. Neldon Mc  Acquired hypothyroidism  Elevated cholesterol  Family history of rheumatoid arthritis - Great grandmother  Family history of lupus erythematosus - First cousin  Orders: Orders Placed This Encounter  Procedures  . COMPLETE METABOLIC PANEL WITH GFR  . CBC with Differential/Platelet  . Thiopurine methyltransferase(tpmt)rbc   Meds ordered this encounter  Medications  . predniSONE (DELTASONE) 5 MG tablet    Sig: Take 4 tablets by mouth daily x4 days, 3 tablets by mouth daily x4 days, 2 tablets by mouth daily x4 days, 1 tablet by mouth daily x4 days.    Dispense:  40 tablet    Refill:  0    Follow-Up  Instructions: Return in about 6 weeks (around 08/12/2020) for Mixed connective tissue disease.   Ofilia Neas, PA-C  Note - This record has been created using Dragon software.  Chart creation errors have been sought, but may not always  have been located. Such creation errors do not reflect on  the standard of medical care.

## 2020-06-30 NOTE — Telephone Encounter (Signed)
Opened in error

## 2020-07-01 ENCOUNTER — Other Ambulatory Visit: Payer: Self-pay

## 2020-07-01 ENCOUNTER — Telehealth (HOSPITAL_COMMUNITY): Payer: Self-pay | Admitting: Internal Medicine

## 2020-07-01 ENCOUNTER — Encounter: Payer: Self-pay | Admitting: Physician Assistant

## 2020-07-01 ENCOUNTER — Ambulatory Visit: Payer: BC Managed Care – PPO | Admitting: Physician Assistant

## 2020-07-01 VITALS — BP 118/77 | HR 89 | Ht 66.5 in | Wt 190.0 lb

## 2020-07-01 DIAGNOSIS — E039 Hypothyroidism, unspecified: Secondary | ICD-10-CM

## 2020-07-01 DIAGNOSIS — Z840 Family history of diseases of the skin and subcutaneous tissue: Secondary | ICD-10-CM

## 2020-07-01 DIAGNOSIS — Z79899 Other long term (current) drug therapy: Secondary | ICD-10-CM | POA: Diagnosis not present

## 2020-07-01 DIAGNOSIS — E7800 Pure hypercholesterolemia, unspecified: Secondary | ICD-10-CM

## 2020-07-01 DIAGNOSIS — R5383 Other fatigue: Secondary | ICD-10-CM

## 2020-07-01 DIAGNOSIS — M7712 Lateral epicondylitis, left elbow: Secondary | ICD-10-CM

## 2020-07-01 DIAGNOSIS — M7918 Myalgia, other site: Secondary | ICD-10-CM

## 2020-07-01 DIAGNOSIS — F411 Generalized anxiety disorder: Secondary | ICD-10-CM

## 2020-07-01 DIAGNOSIS — R768 Other specified abnormal immunological findings in serum: Secondary | ICD-10-CM | POA: Diagnosis not present

## 2020-07-01 DIAGNOSIS — M79642 Pain in left hand: Secondary | ICD-10-CM

## 2020-07-01 DIAGNOSIS — L270 Generalized skin eruption due to drugs and medicaments taken internally: Secondary | ICD-10-CM | POA: Diagnosis not present

## 2020-07-01 DIAGNOSIS — M533 Sacrococcygeal disorders, not elsewhere classified: Secondary | ICD-10-CM

## 2020-07-01 DIAGNOSIS — M79641 Pain in right hand: Secondary | ICD-10-CM

## 2020-07-01 DIAGNOSIS — L500 Allergic urticaria: Secondary | ICD-10-CM

## 2020-07-01 DIAGNOSIS — G8929 Other chronic pain: Secondary | ICD-10-CM

## 2020-07-01 DIAGNOSIS — M545 Low back pain, unspecified: Secondary | ICD-10-CM

## 2020-07-01 DIAGNOSIS — M7061 Trochanteric bursitis, right hip: Secondary | ICD-10-CM

## 2020-07-01 DIAGNOSIS — Z8261 Family history of arthritis: Secondary | ICD-10-CM

## 2020-07-01 DIAGNOSIS — M7062 Trochanteric bursitis, left hip: Secondary | ICD-10-CM

## 2020-07-01 MED ORDER — PREDNISONE 5 MG PO TABS: ORAL_TABLET | ORAL | 0 refills | Status: DC

## 2020-07-01 NOTE — Telephone Encounter (Addendum)
Pending lab results AND chest x-ray, patient will be starting Imuran, per Hazel Sams, PA-C. Consent obtained and sent to the scan center. Thanks!

## 2020-07-01 NOTE — Progress Notes (Unsigned)
Patient states she will go to [facility] for chest x-ray. Please review order and sign. Thanks!

## 2020-07-01 NOTE — Telephone Encounter (Signed)
Urgent referral for DB, please advise

## 2020-07-01 NOTE — Patient Instructions (Addendum)
Azathioprine tablets What is this medicine? AZATHIOPRINE (ay za THYE oh preen) suppresses the immune system. It is used to prevent organ rejection after a transplant. It is also used to treat rheumatoid arthritis. This medicine may be used for other purposes; ask your health care provider or pharmacist if you have questions. COMMON BRAND NAME(S): Azasan, Imuran What should I tell my health care provider before I take this medicine? They need to know if you have any of these conditions:  infection  kidney disease  liver disease  an unusual or allergic reaction to azathioprine, other medicines, lactose, foods, dyes, or preservatives  pregnant or trying to get pregnant  breast feeding How should I use this medicine? Take this medicine by mouth with a full glass of water. Follow the directions on the prescription label. Take your medicine at regular intervals. Do not take your medicine more often than directed. Continue to take your medicine even if you feel better. Do not stop taking except on your doctor's advice. Talk to your pediatrician regarding the use of this medicine in children. Special care may be needed. Overdosage: If you think you have taken too much of this medicine contact a poison control center or emergency room at once. NOTE: This medicine is only for you. Do not share this medicine with others. What if I miss a dose? If you miss a dose, take it as soon as you can. If it is almost time for your next dose, take only that dose. Do not take double or extra doses. What may interact with this medicine? Do not take this medicine with any of the following medications:  febuxostat  mercaptopurine This medicine may also interact with the following medications:  allopurinol  aminosalicylates like sulfasalazine, mesalamine, balsalazide, and olsalazine  leflunomide  medicines called ACE inhibitors like benazepril, captopril, enalapril, fosinopril, quinapril, lisinopril,  ramipril, and trandolapril  mycophenolate  sulfamethoxazole; trimethoprim  vaccines  warfarin This list may not describe all possible interactions. Give your health care provider a list of all the medicines, herbs, non-prescription drugs, or dietary supplements you use. Also tell them if you smoke, drink alcohol, or use illegal drugs. Some items may interact with your medicine. What should I watch for while using this medicine? Visit your doctor or health care professional for regular checks on your progress. You will need frequent blood checks during the first few months you are receiving the medicine. If you get a cold or other infection while receiving this medicine, call your doctor or health care professional. Do not treat yourself. The medicine may increase your risk of getting an infection. Women should inform their doctor if they wish to become pregnant or think they might be pregnant. There is a potential for serious side effects to an unborn child. Talk to your health care professional or pharmacist for more information. Men may have a reduced sperm count while they are taking this medicine. Talk to your health care professional for more information. This medicine may increase your risk of getting certain kinds of cancer. Talk to your doctor about healthy lifestyle choices, important screenings, and your risk. What side effects may I notice from receiving this medicine? Side effects that you should report to your doctor or health care professional as soon as possible:  allergic reactions like skin rash, itching or hives, swelling of the face, lips, or tongue  changes in vision  confusion  fever, chills, or any other sign of infection  loss of balance or coordination  severe stomach pain  unusual bleeding, bruising  unusually weak or tired  vomiting  yellowing of the eyes or skin Side effects that usually do not require medical attention (report to your doctor or health  care professional if they continue or are bothersome):  hair loss  nausea This list may not describe all possible side effects. Call your doctor for medical advice about side effects. You may report side effects to FDA at 1-800-FDA-1088. Where should I keep my medicine? Keep out of the reach of children. Store at room temperature between 15 and 25 degrees C (59 and 77 degrees F). Protect from light. Throw away any unused medicine after the expiration date. NOTE: This sheet is a summary. It may not cover all possible information. If you have questions about this medicine, talk to your doctor, pharmacist, or health care provider.  2021 Elsevier/Gold Standard (2013-05-21 12:00:31)

## 2020-07-02 ENCOUNTER — Other Ambulatory Visit: Payer: Self-pay

## 2020-07-02 ENCOUNTER — Ambulatory Visit (HOSPITAL_COMMUNITY)
Admission: RE | Admit: 2020-07-02 | Discharge: 2020-07-02 | Disposition: A | Discharge status: Home / Self Care | Payer: BC Managed Care – PPO | Source: Ambulatory Visit | Attending: Physician Assistant | Admitting: Physician Assistant

## 2020-07-02 ENCOUNTER — Other Ambulatory Visit: Payer: Self-pay | Admitting: *Deleted

## 2020-07-02 DIAGNOSIS — Z79899 Other long term (current) drug therapy: Secondary | ICD-10-CM

## 2020-07-02 DIAGNOSIS — R768 Other specified abnormal immunological findings in serum: Secondary | ICD-10-CM

## 2020-07-02 NOTE — Progress Notes (Signed)
CXR revealed mild bilateral interstitial prominence.  ILD cannot be ruled out.    I will call the patient to discuss.   Please refer to Dr. Vaughan Browner or Dr. Chase Caller for further evaluation.

## 2020-07-02 NOTE — Progress Notes (Signed)
CBC and CMP WNL

## 2020-07-02 NOTE — Telephone Encounter (Signed)
I called patient, CBC and CMP WNL, patient will have chest x-ray 07/02/2020

## 2020-07-02 NOTE — Progress Notes (Signed)
I attempted to call the patient to review CXR results but had to leave a voicemail to call us back.

## 2020-07-03 NOTE — Progress Notes (Signed)
I called the patient to review CXR results.  All questions were addressed. Discussed referral was placed to [facility].  She is still awaiting an appointment with Dr. Clayborne Dana office.  She tried calling their office to schedule an appt but was unable to do so.  She states her rash has resolved since starting on the prednisone taper and discontinuing plaquenil.  She is not experiencing any other new or worsening symptoms.  She will notify us when she has completed the prednisone taper then we will send in the prescription for imuran.

## 2020-07-09 NOTE — Telephone Encounter (Signed)
Patient developed a rash on Plaquenil.  She is currently on prednisone taper.  She will call us once rash clears up and then we will send a prescription for Imuran.

## 2020-07-10 ENCOUNTER — Other Ambulatory Visit (HOSPITAL_COMMUNITY): Payer: BC Managed Care – PPO

## 2020-07-10 ENCOUNTER — Other Ambulatory Visit (HOSPITAL_COMMUNITY)
Admission: RE | Admit: 2020-07-10 | Discharge: 2020-07-10 | Disposition: A | Discharge status: Home / Self Care | Payer: BC Managed Care – PPO | Source: Ambulatory Visit | Attending: Internal Medicine | Admitting: Internal Medicine

## 2020-07-10 DIAGNOSIS — Z01812 Encounter for preprocedural laboratory examination: Secondary | ICD-10-CM | POA: Diagnosis not present

## 2020-07-10 DIAGNOSIS — Z20822 Contact with and (suspected) exposure to covid-19: Secondary | ICD-10-CM | POA: Diagnosis not present

## 2020-07-10 LAB — SARS CORONAVIRUS 2 (TAT 6-24 HRS): SARS Coronavirus 2: NEGATIVE

## 2020-07-10 MED ORDER — AZATHIOPRINE 50 MG PO TABS: 50.0000 mg | ORAL_TABLET | Freq: Every day | ORAL | 0 refills | Status: DC

## 2020-07-10 NOTE — Telephone Encounter (Signed)
Next Visit: 09/09/2020  Last Visit: 07/01/2020  Last Fill: New start  DX: Mixed connective tissue disease   Current Dose per office note 07/01/2020, She will be starting on Imuran 50 mg 1 tablet daily once her rash completely resolves and her lab work has resulted  Labs: CBC and CMP WNL  Okay to refill Imuran?

## 2020-07-11 LAB — CBC WITH DIFFERENTIAL/PLATELET
Absolute Monocytes: 773 cells/uL (ref 200–950)
Basophils Absolute: 50 cells/uL (ref 0–200)
Basophils Relative: 0.6 %
Eosinophils Absolute: 151 cells/uL (ref 15–500)
Eosinophils Relative: 1.8 %
HCT: 41.5 % (ref 35.0–45.0)
Hemoglobin: 13.3 g/dL (ref 11.7–15.5)
Lymphs Abs: 1848 cells/uL (ref 850–3900)
MCH: 28.8 pg (ref 27.0–33.0)
MCHC: 32 g/dL (ref 32.0–36.0)
MCV: 89.8 fL (ref 80.0–100.0)
MPV: 9.7 fL (ref 7.5–12.5)
Monocytes Relative: 9.2 %
Neutro Abs: 5578 cells/uL (ref 1500–7800)
Neutrophils Relative %: 66.4 %
Platelets: 335 10*3/uL (ref 140–400)
RBC: 4.62 10*6/uL (ref 3.80–5.10)
RDW: 12.7 % (ref 11.0–15.0)
Total Lymphocyte: 22 %
WBC: 8.4 10*3/uL (ref 3.8–10.8)

## 2020-07-11 LAB — COMPLETE METABOLIC PANEL WITH GFR
AG Ratio: 1.6 (calc) (ref 1.0–2.5)
ALT: 25 U/L (ref 6–29)
AST: 21 U/L (ref 10–35)
Albumin: 4.4 g/dL (ref 3.6–5.1)
Alkaline phosphatase (APISO): 106 U/L (ref 37–153)
BUN: 16 mg/dL (ref 7–25)
CO2: 27 mmol/L (ref 20–32)
Calcium: 9.6 mg/dL (ref 8.6–10.4)
Chloride: 103 mmol/L (ref 98–110)
Creat: 0.89 mg/dL (ref 0.50–1.05)
GFR, Est African American: 87 mL/min/{1.73_m2} (ref >=60)
GFR, Est Non African American: 75 mL/min/{1.73_m2} (ref >=60)
Globulin: 2.7 g/dL (calc) (ref 1.9–3.7)
Glucose, Bld: 74 mg/dL (ref 65–99)
Potassium: 4.1 mmol/L (ref 3.5–5.3)
Sodium: 139 mmol/L (ref 135–146)
Total Bilirubin: 0.4 mg/dL (ref 0.2–1.2)
Total Protein: 7.1 g/dL (ref 6.1–8.1)

## 2020-07-11 LAB — THIOPURINE METHYLTRANSFERASE (TPMT), RBC: Thiopurine Methyltransferase, RBC: 16 nmol/hr/mL RBC

## 2020-07-13 NOTE — Progress Notes (Signed)
TPMT is WNL

## 2020-07-14 ENCOUNTER — Ambulatory Visit (HOSPITAL_COMMUNITY)
Admission: RE | Admit: 2020-07-14 | Discharge: 2020-07-14 | Disposition: A | Discharge status: Home / Self Care | Payer: BC Managed Care – PPO | Source: Ambulatory Visit | Attending: Internal Medicine | Admitting: Internal Medicine

## 2020-07-14 ENCOUNTER — Other Ambulatory Visit: Payer: Self-pay

## 2020-07-14 DIAGNOSIS — M351 Other overlap syndromes: Secondary | ICD-10-CM | POA: Diagnosis not present

## 2020-07-14 LAB — PULMONARY FUNCTION TEST
DL/VA % pred: 84 %
DL/VA: 3.55 ml/min/mmHg/L
DLCO unc % pred: 73 %
DLCO unc: 16.82 ml/min/mmHg
FEF 25-75 Post: 3.65 L/sec
FEF 25-75 Pre: 2.87 L/sec
FEF2575-%Change-Post: 27 %
FEF2575-%Pred-Post: 127 %
FEF2575-%Pred-Pre: 99 %
FEV1-%Change-Post: 11 %
FEV1-%Pred-Post: 96 %
FEV1-%Pred-Pre: 86 %
FEV1-Post: 2.92 L
FEV1-Pre: 2.63 L
FEV1FVC-%Change-Post: 4 %
FEV1FVC-%Pred-Pre: 102 %
FEV6-%Change-Post: 6 %
FEV6-%Pred-Post: 91 %
FEV6-%Pred-Pre: 85 %
FEV6-Post: 3.41 L
FEV6-Pre: 3.19 L
FEV6FVC-%Change-Post: 0 %
FEV6FVC-%Pred-Post: 102 %
FEV6FVC-%Pred-Pre: 102 %
FVC-%Change-Post: 6 %
FVC-%Pred-Post: 89 %
FVC-%Pred-Pre: 83 %
FVC-Post: 3.41 L
FVC-Pre: 3.21 L
Post FEV1/FVC ratio: 86 %
Post FEV6/FVC ratio: 100 %
Pre FEV1/FVC ratio: 82 %
Pre FEV6/FVC Ratio: 99 %
RV % pred: 90 %
RV: 1.74 L
TLC % pred: 96 %
TLC: 5.26 L

## 2020-07-14 MED ORDER — ALBUTEROL SULFATE (2.5 MG/3ML) 0.083% IN NEBU
2.5000 mg | INHALATION_SOLUTION | Freq: Once | RESPIRATORY_TRACT | Status: AC
  Administered 2020-07-14: 2.5 mg via RESPIRATORY_TRACT

## 2020-07-20 ENCOUNTER — Telehealth: Payer: Self-pay

## 2020-07-20 NOTE — Telephone Encounter (Signed)
Patient called stating she took her Imuran on Wednesday, 07/15/20 after breakfast and almost immediately developed abdominal pain, headache, itching rash on her bottom the size of a golf ball.  Patient states she took the tablet on Thursday also after breakfast and began experiencing flu like symptoms, muscle and joint pain, headache and diarrhea.  Patient states she took the medication on Friday and the body aches worsened and had a difficult time bending.  Patient states she took the medication on Saturday and developed sore throat, stiff neck, ear pain and itching, and pain around her jaw line.  Patient states she also developed a circle like rash on her wrist.  Patient states she took Benadryl on Sunday and today and the body aches have decreased.  Patient states she thinks she is hypersensitive to the medication and hasn't taken it since Saturday.

## 2020-07-21 ENCOUNTER — Ambulatory Visit: Payer: BC Managed Care – PPO | Admitting: Physician Assistant

## 2020-07-21 NOTE — Telephone Encounter (Signed)
Ok to discontinue Imuran. She can discuss other treatment options with Dr. Estanislado Pandy at her upcoming appointment on 08/05/20.

## 2020-07-21 NOTE — Telephone Encounter (Signed)
Patient advised Ok to discontinue Imuran. Patient advised she can discuss other treatment options with Dr. Estanislado Pandy at her upcoming appointment on 08/05/20.

## 2020-07-22 NOTE — Progress Notes (Signed)
Office Visit Note  Patient: [name]             Date of Birth: 11-17-1970           MRN: [id number]             PCP: Brien Few, MD Referring: Brien Few, MD Visit Date: 08/05/2020 Occupation: @GUAROCC @  Subjective:  Joint stiffness.   History of Present Illness: [name] is a 50 y.o. female with history of mixed connective tissue disease.  She states she tried azathioprine but she had to stop it due to flulike symptoms, abdominal pain and headaches.  She continues to have pain and stiffness in multiple joints.  She states she tried gluten-free diet for about 2 to 3 weeks which was helpful as far as joint pain is concerned.  When she consumed gluten she started experiencing bloating and joint pain.  She has occasional dry mouth but she denies history of dry eyes.  She continues to have some discomfort in her left shoulder joint.  She was evaluated by Dr. Apolonio Schneiders.  She had MRI of her left elbow joint and her left neck soft tissue region.  The left elbow MRI showed extensor tendinosis and mild edema.  The MRI of the neck soft tissue was unremarkable except there was some bilateral salt-and-pepper changes in her parotids consistent with Sjogren's.  She went to physical therapy for elbow and the symptoms are improving.  Activities of Daily Living:  Patient reports morning stiffness for 30-45  minutes.   Patient Reports nocturnal pain.  Difficulty dressing/grooming: Denies Difficulty climbing stairs: Reports Difficulty getting out of chair: Denies Difficulty using hands for taps, buttons, cutlery, and/or writing: Denies  Review of Systems  Constitutional:  Positive for fatigue.  HENT:  Positive for mouth dryness. Negative for mouth sores and nose dryness.   Eyes:  Negative for pain, itching and dryness.  Respiratory:  Negative for shortness of breath and difficulty breathing.   Cardiovascular:  Negative for chest pain and palpitations.  Gastrointestinal:   Negative for blood in stool, constipation and diarrhea.  Endocrine: Negative for increased urination.  Genitourinary:  Negative for difficulty urinating.  Musculoskeletal:  Positive for joint pain, joint pain, joint swelling, myalgias, morning stiffness, muscle tenderness and myalgias.  Skin:  Negative for color change, rash, redness and sensitivity to sunlight.  Allergic/Immunologic: Negative for susceptible to infections.  Neurological:  Negative for dizziness, numbness, headaches, memory loss and weakness.  Hematological:  Positive for bruising/bleeding tendency. Negative for swollen glands.  Psychiatric/Behavioral:  Negative for behavioral problems, confusion and sleep disturbance. The patient is not nervous/anxious.    PMFS History:  Patient Active Problem List   Diagnosis Date Noted   Rash and other nonspecific skin eruption 05/29/2020   Generalized anxiety disorder 02/02/2018   Allergic urticaria 02/02/2018   Acquired hypothyroidism 02/02/2018   Elevated cholesterol 02/02/2018    Past Medical History:  Diagnosis Date   Allergy    Anxiety    Complication of anesthesia    " wakes up  crying"   Depression    Eczema    GERD (gastroesophageal reflux disease)    Hypothyroidism    Interstitial cystitis    Ligament tear    Left elbow   Menorrhagia    Migraines    PONV (postoperative nausea and vomiting)    likes phenergan and scopolamine patch   Tennis elbow    Tennis elbow    Thyroid disease    Trigger finger  of right hand    Urticaria     Family History  Problem Relation Age of Onset   Thyroid disease Mother    Hypertension Father    Hyperlipidemia Father    Cancer Maternal Grandmother    Diabetes Maternal Grandmother    Hypertension Maternal Grandmother    Heart disease Maternal Grandfather    Hyperlipidemia Maternal Grandfather    Hypertension Maternal Grandfather    Diabetes Paternal Grandmother    Stroke Paternal Grandmother    Diabetes Paternal Grandfather     Heart disease Paternal Grandfather    Hyperlipidemia Paternal Grandfather    Hypertension Paternal Grandfather    Past Surgical History:  Procedure Laterality Date   carpal tunnel release Bilateral 2010   tennis elbow also done   CESAREAN SECTION     x 2   CHOLECYSTECTOMY  2003   DIAGNOSTIC LAPAROSCOPY  20002   thru belly button to check for endometriosis   DILITATION & CURRETTAGE/HYSTROSCOPY WITH NOVASURE ABLATION N/A 11/18/2019   Procedure: DILATATION & CURETTAGE/HYSTEROSCOPY WITH NOVASURE ABLATION;  Surgeon: Brien Few, MD;  Location: [facility];  Service: Gynecology;  Laterality: N/A;   ELBOW SURGERY     Social History   Social History Narrative   Not on file   Immunization History  Administered Date(s) Administered   Influenza,inj,Quad PF,6+ Mos 11/17/2015, 11/18/2016   Influenza-Unspecified 12/08/2017   Moderna Sars-Covid-2 Vaccination 02/04/2020   PFIZER Comirnaty(Gray Top)Covid-19 Tri-Sucrose Vaccine 04/12/2019, 05/03/2019     Objective: Vital Signs: BP 118/79 (BP Location: Left Arm, Patient Position: Sitting, Cuff Size: Normal)   Pulse 86   Resp 15   Ht 5' 6.5" (1.689 m)   Wt 189 lb 12.8 oz (86.1 kg)   BMI 30.18 kg/m    Physical Exam Vitals and nursing note reviewed.  Constitutional:      Appearance: She is well-developed.  HENT:     Head: Normocephalic and atraumatic.  Eyes:     Conjunctiva/sclera: Conjunctivae normal.  Cardiovascular:     Rate and Rhythm: Normal rate and regular rhythm.     Heart sounds: Normal heart sounds.  Pulmonary:     Effort: Pulmonary effort is normal.     Breath sounds: Normal breath sounds.  Abdominal:     General: Bowel sounds are normal.     Palpations: Abdomen is soft.  Musculoskeletal:     Cervical back: Normal range of motion.  Lymphadenopathy:     Cervical: No cervical adenopathy.  Skin:    General: Skin is warm and dry.     Capillary Refill: Capillary refill takes less than 2 seconds.   Neurological:     Mental Status: She is alert and oriented to person, place, and time.  Psychiatric:        Behavior: Behavior normal.     Musculoskeletal Exam: C-spine was in good range of motion.  Shoulder joints, elbow joints, wrist joints, MCPs PIPs and DIPs with good range of motion with no synovitis.  She has some PIP and DIP thickening.  Hip joints with good range of motion.  She had tenderness on palpation of bilateral trochanteric bursa.  She had good range of motion of her knee joints without any warmth swelling or effusion.  There was no tenderness over MTPs or PIPs.  No synovitis was noted over ankle joints.  CDAI Exam: CDAI Score: -- Patient Global: --; Provider Global: -- Swollen: --; Tender: -- Joint Exam 08/05/2020   No joint exam has been documented for this  visit   There is currently no information documented on the homunculus. Go to the Rheumatology activity and complete the homunculus joint exam.  Investigation: No additional findings.  Imaging: No results found.  Recent Labs: Lab Results  Component Value Date   WBC 8.4 07/01/2020   HGB 13.3 07/01/2020   PLT 335 07/01/2020   NA 139 07/01/2020   K 4.1 07/01/2020   CL 103 07/01/2020   CO2 27 07/01/2020   GLUCOSE 74 07/01/2020   BUN 16 07/01/2020   CREATININE 0.89 07/01/2020   BILITOT 0.4 07/01/2020   ALKPHOS 119 (H) 02/02/2018   AST 21 07/01/2020   ALT 25 07/01/2020   PROT 7.1 07/01/2020   ALBUMIN 4.6 02/02/2018   CALCIUM 9.6 07/01/2020   GFRAA 87 07/01/2020   QFTBGOLDPLUS NEGATIVE 05/04/2020    Speciality Comments: PLQ EYE EXAM 06/23/2020 WNL PLQ- , rash, sob,  Imuran-headache, abdominal pain,flu like symptoms  She brought MRI elbow results Jun 12, 2020 from [facility] which showed severe extensor tendinosis of the common extensor tendon and mild edema of the ulnar nerve. July 20, 2020 MRI of the soft tissue of the neck was unremarkable except for salt-and-pepper appearance of bilateral parotid  glands without significant atrophy suggestive of Sjogren's syndrome.  Procedures:  No procedures performed Allergies: Augmentin [amoxicillin-pot clavulanate], Imuran [azathioprine], Other, Penicillins, Sulfa antibiotics, Vibramycin [doxycycline], Doxycycline calcium, and Latex   Assessment / Plan:     Visit Diagnoses: Mixed connective tissue disease ([place]) - Positive ANA, positive RNP, dry mouth and inflammatory arthritis: Patient states that she had intolerance to Imuran and she stopped Imuran.  She has been on gluten-free diet for the last few weeks and noticed improvement in all of her joint symptoms.  1 day she accidentally had gluten and her symptoms recurred.  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  She has not noticed any joint swelling.  She was having fullness in her left trapezius region for which she was seen at [facility].  She had MRI of the neck region which showed salt-and-pepper appearance of the parotid glands suspicious of Sjogren's.  She had intolerance to hydroxychloroquine in the past.  At this point I do not see any need for immunosuppression unless she has any evidence of interstitial lung disease.  She would like a second opinion.  I will refer her to [facility] rheumatology.- Plan: Ambulatory referral to Rheumatology  High risk medication use -she tried Plaquenil in the past which caused a rash and shortness of breath.  She also was given Imuran which caused headaches abdominal pain and flulike symptoms.  I will hold off any immunosuppression at this point.- Plan: CBC with Differential/Platelet, COMPLETE METABOLIC PANEL WITH GFR  Lateral epicondylitis, left elbow-she had MRI of her left elbow joint at [facility] which showed severe common extensor tendon tendinitis.  She also had mild edema of ulnar nerve consistent with ulnar neuritis.  She states her symptoms are improving with physical therapy.  Primary osteoarthritis of  both hands-she has DIP and PIP thickening with no synovitis.  Chronic SI joint pain - History of significant morning stiffness.  HLA-B27 is negative.  X-rays of SI joints were unremarkable.   Trochanteric bursitis of both hips-she continues to have bilateral trochanteric bursitis.  IT band stretches were emphasized.  Chronic midline low back pain without sciatica -  X-ray showed dextroscoliosis, facet joint arthropathy and some degenerative changes.  She has chronic lower back pain.  Myofascial  pain-she experiences generalized pain and discomfort and positive tender points.  Need for regular exercise and stretching was emphasized.  Other fatigue-she continues to have some fatigue.  Generalized anxiety disorder  NCGS (non-celiac gluten sensitivity) -patient states that she tried gluten-free diet and noticed improvement in her symptoms.  Her symptoms recurred when she added gluten into her diet.  We will check following labs today.  Plan: Tissue transglutaminase, IgA, Gliadin antibodies, serum, IgG, IgA, IgM  Acquired hypothyroidism  Allergic urticaria - By Dr. Neldon Mc  Elevated cholesterol  Family history of lupus erythematosus - First cousin  Family history of rheumatoid arthritis - Great grandmother  Drug-induced skin rash - Advised to discontinue plaquenil due to drug reaction.    Orders: Orders Placed This Encounter  Procedures   Tissue transglutaminase, IgA   Gliadin antibodies, serum   IgG, IgA, IgM   CBC with Differential/Platelet   COMPLETE METABOLIC PANEL WITH GFR   Ambulatory referral to Rheumatology    No orders of the defined types were placed in this encounter.    Follow-Up Instructions: Return for MCTD.   Bo Merino, MD  Note - This record has been created using Editor, commissioning.  Chart creation errors have been sought, but may not always  have been located. Such creation errors do not reflect on  the standard of medical care.

## 2020-08-05 ENCOUNTER — Ambulatory Visit (INDEPENDENT_AMBULATORY_CARE_PROVIDER_SITE_OTHER): Payer: BC Managed Care – PPO | Admitting: Rheumatology

## 2020-08-05 ENCOUNTER — Encounter: Payer: Self-pay | Admitting: Rheumatology

## 2020-08-05 ENCOUNTER — Other Ambulatory Visit: Payer: Self-pay

## 2020-08-05 VITALS — BP 118/79 | HR 86 | Resp 15 | Ht 66.5 in | Wt 189.8 lb

## 2020-08-05 DIAGNOSIS — M545 Low back pain, unspecified: Secondary | ICD-10-CM

## 2020-08-05 DIAGNOSIS — M351 Other overlap syndromes: Secondary | ICD-10-CM

## 2020-08-05 DIAGNOSIS — L270 Generalized skin eruption due to drugs and medicaments taken internally: Secondary | ICD-10-CM

## 2020-08-05 DIAGNOSIS — M533 Sacrococcygeal disorders, not elsewhere classified: Secondary | ICD-10-CM

## 2020-08-05 DIAGNOSIS — R5383 Other fatigue: Secondary | ICD-10-CM

## 2020-08-05 DIAGNOSIS — Z8261 Family history of arthritis: Secondary | ICD-10-CM

## 2020-08-05 DIAGNOSIS — E039 Hypothyroidism, unspecified: Secondary | ICD-10-CM

## 2020-08-05 DIAGNOSIS — M7712 Lateral epicondylitis, left elbow: Secondary | ICD-10-CM | POA: Diagnosis not present

## 2020-08-05 DIAGNOSIS — M19042 Primary osteoarthritis, left hand: Secondary | ICD-10-CM

## 2020-08-05 DIAGNOSIS — M19041 Primary osteoarthritis, right hand: Secondary | ICD-10-CM

## 2020-08-05 DIAGNOSIS — Z79899 Other long term (current) drug therapy: Secondary | ICD-10-CM

## 2020-08-05 DIAGNOSIS — G8929 Other chronic pain: Secondary | ICD-10-CM

## 2020-08-05 DIAGNOSIS — F411 Generalized anxiety disorder: Secondary | ICD-10-CM

## 2020-08-05 DIAGNOSIS — E7800 Pure hypercholesterolemia, unspecified: Secondary | ICD-10-CM

## 2020-08-05 DIAGNOSIS — L500 Allergic urticaria: Secondary | ICD-10-CM

## 2020-08-05 DIAGNOSIS — M7918 Myalgia, other site: Secondary | ICD-10-CM

## 2020-08-05 DIAGNOSIS — K9041 Non-celiac gluten sensitivity: Secondary | ICD-10-CM

## 2020-08-05 DIAGNOSIS — M7062 Trochanteric bursitis, left hip: Secondary | ICD-10-CM

## 2020-08-05 DIAGNOSIS — M7061 Trochanteric bursitis, right hip: Secondary | ICD-10-CM

## 2020-08-05 DIAGNOSIS — Z840 Family history of diseases of the skin and subcutaneous tissue: Secondary | ICD-10-CM

## 2020-08-05 NOTE — Patient Instructions (Signed)
Dr. Sharlet Salina [phone]

## 2020-08-06 ENCOUNTER — Other Ambulatory Visit (HOSPITAL_COMMUNITY): Payer: BC Managed Care – PPO

## 2020-08-06 LAB — CBC WITH DIFFERENTIAL/PLATELET
Absolute Monocytes: 647 cells/uL (ref 200–950)
Basophils Absolute: 33 cells/uL (ref 0–200)
Basophils Relative: 0.4 %
Eosinophils Absolute: 91 cells/uL (ref 15–500)
Eosinophils Relative: 1.1 %
HCT: 41.4 % (ref 35.0–45.0)
Hemoglobin: 13.7 g/dL (ref 11.7–15.5)
Lymphs Abs: 1785 cells/uL (ref 850–3900)
MCH: 29.3 pg (ref 27.0–33.0)
MCHC: 33.1 g/dL (ref 32.0–36.0)
MCV: 88.5 fL (ref 80.0–100.0)
MPV: 9.3 fL (ref 7.5–12.5)
Monocytes Relative: 7.8 %
Neutro Abs: 5744 cells/uL (ref 1500–7800)
Neutrophils Relative %: 69.2 %
Platelets: 334 10*3/uL (ref 140–400)
RBC: 4.68 10*6/uL (ref 3.80–5.10)
RDW: 13 % (ref 11.0–15.0)
Total Lymphocyte: 21.5 %
WBC: 8.3 10*3/uL (ref 3.8–10.8)

## 2020-08-06 LAB — COMPLETE METABOLIC PANEL WITH GFR
AG Ratio: 1.8 (calc) (ref 1.0–2.5)
ALT: 21 U/L (ref 6–29)
AST: 19 U/L (ref 10–35)
Albumin: 4.4 g/dL (ref 3.6–5.1)
Alkaline phosphatase (APISO): 108 U/L (ref 37–153)
BUN: 10 mg/dL (ref 7–25)
CO2: 27 mmol/L (ref 20–32)
Calcium: 9.3 mg/dL (ref 8.6–10.4)
Chloride: 102 mmol/L (ref 98–110)
Creat: 0.71 mg/dL (ref 0.50–1.05)
GFR, Est African American: 114 mL/min/{1.73_m2} (ref >=60)
GFR, Est Non African American: 99 mL/min/{1.73_m2} (ref >=60)
Globulin: 2.4 g/dL (calc) (ref 1.9–3.7)
Glucose, Bld: 77 mg/dL (ref 65–99)
Potassium: 3.9 mmol/L (ref 3.5–5.3)
Sodium: 138 mmol/L (ref 135–146)
Total Bilirubin: 0.4 mg/dL (ref 0.2–1.2)
Total Protein: 6.8 g/dL (ref 6.1–8.1)

## 2020-08-06 LAB — IGG, IGA, IGM
IgG (Immunoglobin G), Serum: 925 mg/dL (ref 600–1640)
IgM, Serum: 122 mg/dL (ref 50–300)
Immunoglobulin A: 204 mg/dL (ref 47–310)

## 2020-08-06 LAB — GLIADIN ANTIBODIES, SERUM
Gliadin IgA: 1.6 U/mL
Gliadin IgG: <1 U/mL

## 2020-08-06 LAB — TISSUE TRANSGLUTAMINASE, IGA: (tTG) Ab, IgA: <1 U/mL

## 2020-08-06 NOTE — Progress Notes (Signed)
All the test results for celiac disease are negative.  CBC and CMP are normal.

## 2020-08-13 ENCOUNTER — Ambulatory Visit (HOSPITAL_COMMUNITY)
Admission: RE | Admit: 2020-08-13 | Discharge: 2020-08-13 | Disposition: A | Discharge status: Home / Self Care | Payer: BC Managed Care – PPO | Source: Ambulatory Visit | Attending: Internal Medicine | Admitting: Internal Medicine

## 2020-08-13 ENCOUNTER — Other Ambulatory Visit: Payer: Self-pay

## 2020-08-13 DIAGNOSIS — M351 Other overlap syndromes: Secondary | ICD-10-CM | POA: Diagnosis not present

## 2020-08-13 DIAGNOSIS — M069 Rheumatoid arthritis, unspecified: Secondary | ICD-10-CM | POA: Diagnosis not present

## 2020-08-13 DIAGNOSIS — M329 Systemic lupus erythematosus, unspecified: Secondary | ICD-10-CM | POA: Insufficient documentation

## 2020-08-13 LAB — ECHOCARDIOGRAM COMPLETE
Area-P 1/2: 2.95 cm2
S' Lateral: 2.9 cm
Single Plane A4C EF: 60.6 %

## 2020-08-13 NOTE — Progress Notes (Signed)
  Echocardiogram 2D Echocardiogram has been performed.  [name] 08/13/2020, 9:42 AM

## 2020-08-24 ENCOUNTER — Encounter (HOSPITAL_COMMUNITY): Payer: Self-pay | Admitting: Internal Medicine

## 2020-08-24 ENCOUNTER — Other Ambulatory Visit: Payer: Self-pay

## 2020-08-24 ENCOUNTER — Ambulatory Visit (HOSPITAL_COMMUNITY)
Admission: RE | Admit: 2020-08-24 | Discharge: 2020-08-24 | Disposition: A | Discharge status: Home / Self Care | Payer: BC Managed Care – PPO | Source: Ambulatory Visit | Attending: Internal Medicine | Admitting: Internal Medicine

## 2020-08-24 VITALS — BP 130/80 | HR 82 | Wt 185.2 lb

## 2020-08-24 DIAGNOSIS — Z8349 Family history of other endocrine, nutritional and metabolic diseases: Secondary | ICD-10-CM | POA: Insufficient documentation

## 2020-08-24 DIAGNOSIS — Z79899 Other long term (current) drug therapy: Secondary | ICD-10-CM | POA: Diagnosis not present

## 2020-08-24 DIAGNOSIS — I272 Pulmonary hypertension, unspecified: Secondary | ICD-10-CM

## 2020-08-24 DIAGNOSIS — Z8249 Family history of ischemic heart disease and other diseases of the circulatory system: Secondary | ICD-10-CM | POA: Diagnosis not present

## 2020-08-24 DIAGNOSIS — F419 Anxiety disorder, unspecified: Secondary | ICD-10-CM | POA: Insufficient documentation

## 2020-08-24 DIAGNOSIS — Z882 Allergy status to sulfonamides status: Secondary | ICD-10-CM | POA: Diagnosis not present

## 2020-08-24 DIAGNOSIS — Z881 Allergy status to other antibiotic agents status: Secondary | ICD-10-CM | POA: Diagnosis not present

## 2020-08-24 DIAGNOSIS — R9389 Abnormal findings on diagnostic imaging of other specified body structures: Secondary | ICD-10-CM

## 2020-08-24 DIAGNOSIS — I2721 Secondary pulmonary arterial hypertension: Secondary | ICD-10-CM | POA: Insufficient documentation

## 2020-08-24 DIAGNOSIS — M351 Other overlap syndromes: Secondary | ICD-10-CM | POA: Diagnosis not present

## 2020-08-24 DIAGNOSIS — Z880 Allergy status to penicillin: Secondary | ICD-10-CM | POA: Insufficient documentation

## 2020-08-24 NOTE — Addendum Note (Signed)
Encounter addended by: Stanford Scotland, RN on: 08/24/2020 3:40 PM  Actions taken: Visit diagnoses modified, Order list changed, Diagnosis association updated, Clinical Note Signed

## 2020-08-24 NOTE — Progress Notes (Signed)
[facility] CONSULT NOTE  Referring Physician: Dr. Arlean Hopping Primary Care: Brien Few, MD Primary Cardiologist: New  HPI:  [name] is a 50 y/o woman with GERD, hypothyroidism and mixed connective tissue disorder referred by Dr. Estanislado Pandy for screening for pulmonary HTN.   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 Fairly active but has been limited by tennis elbow. Gets SOB going upstairs which she relates to gaining weight. Has swelling in her hands. Has arthritis and morning stiffness in her joints.   Echo 08/13/20  EF 60-65% RV ok. No evidence PAH PFTs 07/14/20: FEV1 2.6 (86%) FVC 3.2 (83%) DLCO 73%   CXR 5/22: Mild interstitial prominence   Review of Systems: [y] = yes, [ ]  = no   General: Weight gain [ ] ; Weight loss [ ] ; Anorexia [ ] ; Fatigue [ ] ; Fever [ ] ; Chills [ ] ; Weakness [ ]   Cardiac: Chest pain/pressure [ ] ; Resting SOB [ ] ; Exertional SOB [ y]; Orthopnea [ ] ; Pedal Edema [ ] ; Palpitations [ ] ; Syncope [ ] ; Presyncope [ ] ; Paroxysmal nocturnal dyspnea[ ]   Pulmonary: Cough [ ] ; Wheezing[ ] ; Hemoptysis[ ] ; Sputum [ ] ; Snoring [ ]   GI: Vomiting[ ] ; Dysphagia[ ] ; Melena[ ] ; Hematochezia [ ] ; Heartburn[y ]; Abdominal pain [ ] ; Constipation [ ] ; Diarrhea [ ] ; BRBPR [ ]   GU: Hematuria[ ] ; Dysuria [ ] ; Nocturia[ ]   Vascular: Pain in legs with walking [ ] ; Pain in feet with lying flat [ ] ; Non-healing sores [ ] ; Stroke [ ] ; TIA [ ] ; Slurred speech [ ] ;  Neuro: Headaches[ ] ; Vertigo[ ] ; Seizures[ ] ; Paresthesias[ ] ;Blurred vision [ ] ; Diplopia [ ] ; Vision changes [ ]   Ortho/Skin: Arthritis [ y]; Joint pain [ y]; Muscle pain [ ] ; Joint swelling [ ] ; Back Pain [ ] ; Rash [ ]   Psych: Depression[ y]; Anxiety[ y]  Heme: Bleeding problems [ ] ; Clotting disorders [ ] ; Anemia [ ]   Endocrine: Diabetes [ ] ; Thyroid dysfunction[y ]   Past Medical History:  Diagnosis Date   Allergy    Anxiety    Complication of anesthesia    " wakes up  crying"   Depression    Eczema     GERD (gastroesophageal reflux disease)    Hypothyroidism    Interstitial cystitis    Ligament tear    Left elbow   Menorrhagia    Migraines    PONV (postoperative nausea and vomiting)    likes phenergan and scopolamine patch   Tennis elbow    Tennis elbow    Thyroid disease    Trigger finger of right hand    Urticaria     Current Outpatient Medications  Medication Sig Dispense Refill   EPINEPHrine (AUVI-Q) 0.3 mg/0.3 mL IJ SOAJ injection Use as directed for severe allergic reaction 2 Device 2   famotidine (PEPCID) 40 MG tablet TAKE 1 TABLET(40 MG) BY MOUTH EVERY EVENING 90 tablet 1   fluticasone (FLONASE) 50 MCG/ACT nasal spray Use one to two sprays in each nostril once daily as directed. 16 g 5   hydrOXYzine (ATARAX/VISTARIL) 25 MG tablet Take 1 tablet (25 mg total) by mouth 3 (three) times daily as needed. 180 tablet 4   levothyroxine (SYNTHROID, LEVOTHROID) 100 MCG tablet Take 1 tablet (100 mcg total) by mouth daily. 90 tablet 3   LORazepam (ATIVAN) 0.5 MG tablet Take 1-2 tablets (0.5-1 mg total) by mouth at bedtime as needed for anxiety. 30 tablet 2   naproxen sodium (ALEVE)  220 MG tablet Take 220 mg by mouth as needed.     nitrofurantoin, macrocrystal-monohydrate, (MACROBID) 100 MG capsule Take 100 mg by mouth as needed.     omeprazole (PRILOSEC) 40 MG capsule Take 1 capsule (40 mg total) by mouth daily. 90 capsule 2   No current facility-administered medications for this encounter.    Allergies  Allergen Reactions   Augmentin [Amoxicillin-Pot Clavulanate] Swelling    Pt has tolerated amoxicillin prior Black rings around eyes   Imuran [Azathioprine]     Muscle aches, joint aches, fatigue, GI upset, itching, rash    Other     Other reaction(s): Chest Pain   Penicillins     Other reaction(s): Eye Swelling, Headache   Sulfa Antibiotics Shortness Of Breath    Other reaction(s): Unknown   Vibramycin [Doxycycline]     Other reaction(s): Edema, Edema, Unknown    Doxycycline Calcium Swelling    Retained fluid   Latex Rash   Plaquenil [Hydroxychloroquine] Rash      Social History   Socioeconomic History   Marital status: Significant Other    Spouse name: Not on file   Number of children: Not on file   Years of education: Not on file   Highest education level: Not on file  Occupational History   Not on file  Tobacco Use   Smoking status: Never   Smokeless tobacco: Never  Vaping Use   Vaping Use: Never used  Substance and Sexual Activity   Alcohol use: Yes    Alcohol/week: 2.0 standard drinks    Types: 2 Glasses of wine per week    Comment: a few times weekly   Drug use: No   Sexual activity: Not on file  Other Topics Concern   Not on file  Social History Narrative   Not on file   Social Determinants of Health   Financial Resource Strain: Not on file  Food Insecurity: Not on file  Transportation Needs: Not on file  Physical Activity: Not on file  Stress: Not on file  Social Connections: Not on file  Intimate Partner Violence: Not on file      Family History  Problem Relation Age of Onset   Thyroid disease Mother    Hypertension Father    Hyperlipidemia Father    Cancer Maternal Grandmother    Diabetes Maternal Grandmother    Hypertension Maternal Grandmother    Heart disease Maternal Grandfather    Hyperlipidemia Maternal Grandfather    Hypertension Maternal Grandfather    Diabetes Paternal Grandmother    Stroke Paternal Grandmother    Diabetes Paternal Grandfather    Heart disease Paternal Grandfather    Hyperlipidemia Paternal Grandfather    Hypertension Paternal Grandfather     Vitals:   08/24/20 1437  BP: 130/80  Pulse: 82  SpO2: 95%  Weight: 84 kg (185 lb 3.2 oz)    PHYSICAL EXAM: General:  Well appearing. No respiratory difficulty HEENT: normal Neck: supple. no JVD. Carotids 2+ bilat; no bruits. No lymphadenopathy or thryomegaly appreciated. Cor: PMI nondisplaced. Regular rate & rhythm. No rubs,  gallops or murmurs. Lungs: clear Abdomen: soft, nontender, nondistended. No hepatosplenomegaly. No bruits or masses. Good bowel sounds. Extremities: no cyanosis, clubbing, rash, edema + thickened joints Neuro: alert & oriented x 3, cranial nerves grossly intact. moves all 4 extremities w/o difficulty. Affect pleasant.  ECG: NSR 78 No ST-T wave abnormalities.Personally reviewed    ASSESSMENT & PLAN:   PAH screening in mixed connective tissue disorder - We  discussed incidence or PAH and ILD in patients with MTCD (~30%) - Echo 08/13/20  EF 60-65% RV ok. No evidence PAH - PFTs 07/14/20: FEV1 2.6 (86%) FVC 3.2 (83%) DLCO 73% - No evidence of PAH on echo  - CXR and PFTs suggest possible mild interstitial lung disease. She is scheduled to see Dr. Vaughan Browner next week. Will order Hi-res CT to get w/u started - Will need yearly echos   2. Possible ILD - as above. Check hi-RES CT - f/u with Dr. Hedy Camara, MD  3:24 PM

## 2020-08-24 NOTE — Patient Instructions (Signed)
Nice to see you   No medication  changes   Cat scan of chest ordered,insurance authorization required.We will call you to schedule appointment  Your physician has requested that you have an echocardiogram. Echocardiography is a painless test that uses sound waves to create images of your heart. It provides your doctor with information about the size and shape of your heart and how well your heart's chambers and valves are working. This procedure takes approximately one hour. There are no restrictions for this procedure.  Call our office in June 2023 to schedule an appointment with echocardiogram  If you have any questions or concerns before your next appointment please send [place] a message through [place] or call our office at [phone].    TO LEAVE A MESSAGE FOR THE NURSE SELECT OPTION 2, PLEASE LEAVE A MESSAGE INCLUDING: YOUR NAME DATE OF BIRTH CALL BACK NUMBER REASON FOR CALL**this is important as we prioritize the call backs  YOU WILL RECEIVE A CALL BACK THE SAME DAY AS LONG AS YOU CALL BEFORE 4:00 PM  milAt the [facility], you and your health needs are our priority. As part of our continuing mission to provide you with exceptional heart care, we have created designated Provider Care Teams. These Care Teams include your primary Cardiologist (physician) and Advanced Practice Providers (APPs- Physician Assistants and Nurse Practitioners) who all work together to provide you with the care you need, when you need it.   You may see any of the following providers on your designated Care Team at your next follow up: Dr Glori Bickers Dr Loralie Champagne Dr Patrice Paradise, NP Lyda Jester, [place] Ginnie Smart Audry Riles, PharmD   Please be sure to bring in all your medications bottles to every appointment.

## 2020-08-26 NOTE — Progress Notes (Deleted)
Office Visit Note  Patient: [name]             Date of Birth: [birth year]           MRN: [id number]             PCP: Brien Few, MD Referring: Brien Few, MD Visit Date: 09/09/2020 Occupation: @GUAROCC @  Subjective:  No chief complaint on file.   History of Present Illness: [name] is a 50 y.o. female ***   Activities of Daily Living:  Patient reports morning stiffness for *** {minute/hour:19697}.   Patient {ACTIONS;DENIES/REPORTS:21021675::"Denies"} nocturnal pain.  Difficulty dressing/grooming: {ACTIONS;DENIES/REPORTS:21021675::"Denies"} Difficulty climbing stairs: {ACTIONS;DENIES/REPORTS:21021675::"Denies"} Difficulty getting out of chair: {ACTIONS;DENIES/REPORTS:21021675::"Denies"} Difficulty using hands for taps, buttons, cutlery, and/or writing: {ACTIONS;DENIES/REPORTS:21021675::"Denies"}  No Rheumatology ROS completed.   PMFS History:  Patient Active Problem List   Diagnosis Date Noted  . Rash and other nonspecific skin eruption 05/29/2020  . Generalized anxiety disorder 02/02/2018  . 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 Acquired hypothyroidism 02/02/2018  . Elevated cholesterol 02/02/2018    Past Medical History:  Diagnosis Date  . Allergy   . Anxiety   . Complication of anesthesia    " wakes up  crying"  . Depression   . Eczema   . GERD (gastroesophageal reflux disease)   . Hypothyroidism   . Interstitial cystitis   . Ligament tear    Left elbow  . Menorrhagia   . Migraines   . PONV (postoperative nausea and vomiting)    likes phenergan and scopolamine patch  . Tennis elbow   . Tennis elbow   . Thyroid disease   . Trigger finger of right hand   . Urticaria     Family History  Problem Relation Age of Onset  . Thyroid disease Mother   . Hypertension Father   . Hyperlipidemia Father   . Cancer Maternal Grandmother   . Diabetes Maternal Grandmother   . Hypertension Maternal Grandmother   . Heart disease Maternal  Grandfather   . Hyperlipidemia Maternal Grandfather   . Hypertension Maternal Grandfather   . Diabetes Paternal Grandmother   . Stroke Paternal Grandmother   . Diabetes Paternal Grandfather   . Heart disease Paternal Grandfather   . Hyperlipidemia Paternal Grandfather   . Hypertension Paternal Grandfather    Past Surgical History:  Procedure Laterality Date  . carpal tunnel release Bilateral 2010   tennis elbow also done  . CESAREAN SECTION     x 2  . CHOLECYSTECTOMY  2003  . DIAGNOSTIC LAPAROSCOPY  20002   thru belly button to check for endometriosis  . DILITATION & CURRETTAGE/HYSTROSCOPY WITH NOVASURE ABLATION N/A 11/18/2019   Procedure: DILATATION & CURETTAGE/HYSTEROSCOPY WITH NOVASURE ABLATION;  Surgeon: Brien Few, MD;  Location: [facility];  Service: Gynecology;  Laterality: N/A;  . ELBOW SURGERY     Social History   Social History Narrative  . Not on file   Immunization History  Administered Date(s) Administered  . Influenza,inj,Quad PF,6+ Mos 11/17/2015, 11/18/2016  . Influenza-Unspecified 12/08/2017  . Moderna Sars-Covid-2 Vaccination 02/04/2020  . PFIZER Comirnaty(Gray Top)Covid-19 Tri-Sucrose Vaccine 04/12/2019, 05/03/2019     Objective: Vital Signs: There were no vitals taken for this visit.   Physical Exam   Musculoskeletal Exam: ***  CDAI Exam: CDAI Score: -- Patient Global: --; Provider Global: -- Swollen: --; Tender: -- Joint Exam 09/09/2020   No joint exam has been documented for this visit   There is currently no information documented on the homunculus.  Go to the Rheumatology activity and complete the homunculus joint exam.  Investigation: No additional findings.  Imaging: ECHOCARDIOGRAM COMPLETE  Result Date: 08/13/2020    ECHOCARDIOGRAM REPORT   Patient Name:   [name] [facility] Date of Exam: 08/13/2020 Medical Rec #:  [id number]            Height:       66.5 in Accession #:    [id number]           Weight:       189.8  lb Date of Birth:  January 23, 1971            BSA:          1.967 m Patient Age:    89 years             BP:           118/79 mmHg Patient Gender: F                    HR:           86 bpm. Exam Location:  Outpatient Procedure: 2D Echo, Cardiac Doppler and Color Doppler Indications:    Mixed connective tissue disorder  History:        Patient has no prior history of Echocardiogram examinations.                 Rheumatoid arthritis, Lupus.  Sonographer:    Dustin Flock Referring Phys: [id number] DANIEL R BENSIMHON IMPRESSIONS  1. Left ventricular ejection fraction, by estimation, is 60 to 65%. The left ventricle has normal function. The left ventricle has no regional wall motion abnormalities. Left ventricular diastolic parameters were normal.  2. Right ventricular systolic function is normal. The right ventricular size is normal. Tricuspid regurgitation signal is inadequate for assessing PA pressure.  3. The mitral valve is normal in structure. Trivial mitral valve regurgitation. No evidence of mitral stenosis.  4. The aortic valve is tricuspid. Aortic valve regurgitation is not visualized. No aortic stenosis is present.  5. The inferior vena cava is normal in size with greater than 50% respiratory variability, suggesting right atrial pressure of 3 mmHg. FINDINGS  Left Ventricle: Left ventricular ejection fraction, by estimation, is 60 to 65%. The left ventricle has normal function. The left ventricle has no regional wall motion abnormalities. The left ventricular internal cavity size was normal in size. There is  no left ventricular hypertrophy. Left ventricular diastolic parameters were normal. Right Ventricle: The right ventricular size is normal. No increase in right ventricular wall thickness. Right ventricular systolic function is normal. Tricuspid regurgitation signal is inadequate for assessing PA pressure. Left Atrium: Left atrial size was normal in size. Right Atrium: Right atrial size was normal in size.  Pericardium: Trivial pericardial effusion is present. Mitral Valve: The mitral valve is normal in structure. Trivial mitral valve regurgitation. No evidence of mitral valve stenosis. Tricuspid Valve: The tricuspid valve is normal in structure. Tricuspid valve regurgitation is trivial. Aortic Valve: The aortic valve is tricuspid. Aortic valve regurgitation is not visualized. No aortic stenosis is present. Pulmonic Valve: The pulmonic valve was not well visualized. Pulmonic valve regurgitation is not visualized. Aorta: The aortic root and ascending aorta are structurally normal, with no evidence of dilitation. Venous: The inferior vena cava is normal in size with greater than 50% respiratory variability, suggesting right atrial pressure of 3 mmHg. IAS/Shunts: The interatrial septum was not well visualized.  LEFT VENTRICLE PLAX 2D LVIDd:  4.50 cm     Diastology LVIDs:         2.90 cm     LV e' medial:    8.49 cm/s LV PW:         0.90 cm     LV E/e' medial:  11.6 LV IVS:        0.70 cm     LV e' lateral:   11.90 cm/s LVOT diam:     2.00 cm     LV E/e' lateral: 8.3 LV SV:         83 LV SV Index:   42 LVOT Area:     3.14 cm  LV Volumes (MOD) LV vol d, MOD A4C: 72.4 ml LV vol s, MOD A4C: 28.5 ml LV SV MOD A4C:     72.4 ml RIGHT VENTRICLE RV Basal diam:  3.30 cm RV S prime:     10.40 cm/s TAPSE (M-mode): 1.5 cm LEFT ATRIUM             Index       RIGHT ATRIUM           Index LA diam:        3.00 cm 1.53 cm/m  RA Area:     11.80 cm LA Vol (A2C):   71.5 ml 36.35 ml/m RA Volume:   25.50 ml  12.97 ml/m LA Vol (A4C):   46.2 ml 23.49 ml/m LA Biplane Vol: 57.7 ml 29.34 ml/m  AORTIC VALVE LVOT Vmax:   117.00 cm/s LVOT Vmean:  78.400 cm/s LVOT VTI:    0.264 m  AORTA Ao Root diam: 2.60 cm MITRAL VALVE MV Area (PHT): 2.95 cm    SHUNTS MV Decel Time: 257 msec    Systemic VTI:  0.26 m MV E velocity: 98.50 cm/s  Systemic Diam: 2.00 cm MV A velocity: 74.20 cm/s MV E/A ratio:  1.33 Oswaldo Milian MD Electronically  signed by Oswaldo Milian MD Signature Date/Time: 08/13/2020/10:15:08 PM    Final     Recent Labs: Lab Results  Component Value Date   WBC 8.3 08/05/2020   HGB 13.7 08/05/2020   PLT 334 08/05/2020   NA 138 08/05/2020   K 3.9 08/05/2020   CL 102 08/05/2020   CO2 27 08/05/2020   GLUCOSE 77 08/05/2020   BUN 10 08/05/2020   CREATININE 0.71 08/05/2020   BILITOT 0.4 08/05/2020   ALKPHOS 119 (H) 02/02/2018   AST 19 08/05/2020   ALT 21 08/05/2020   PROT 6.8 08/05/2020   ALBUMIN 4.6 02/02/2018   CALCIUM 9.3 08/05/2020   GFRAA 114 08/05/2020   QFTBGOLDPLUS NEGATIVE 05/04/2020    Speciality Comments: PLQ EYE EXAM 06/23/2020 WNL PLQ- , rash, sob,  Imuran-headache, abdominal pain,flu like symptoms  Procedures:  No procedures performed Allergies: Augmentin [amoxicillin-pot clavulanate], Imuran [azathioprine], Other, Penicillins, Sulfa antibiotics, Vibramycin [doxycycline], Doxycycline calcium, Latex, and Plaquenil [hydroxychloroquine]   Assessment / Plan:     Visit Diagnoses: No diagnosis found.  Orders: No orders of the defined types were placed in this encounter.  No orders of the defined types were placed in this encounter.   Face-to-face time spent with patient was *** minutes. Greater than 50% of time was spent in counseling and coordination of care.  Follow-Up Instructions: No follow-ups on file.   Earnestine Mealing, CMA  Note - This record has been created using Editor, commissioning.  Chart creation errors have been sought, but may not always  have been located. Such creation errors do  not reflect on  the standard of medical care.

## 2020-08-27 DIAGNOSIS — M65331 Trigger finger, right middle finger: Secondary | ICD-10-CM | POA: Insufficient documentation

## 2020-08-28 ENCOUNTER — Telehealth (HOSPITAL_COMMUNITY): Payer: Self-pay | Admitting: *Deleted

## 2020-08-31 DIAGNOSIS — M25521 Pain in right elbow: Secondary | ICD-10-CM | POA: Insufficient documentation

## 2020-09-01 ENCOUNTER — Encounter: Payer: Self-pay | Admitting: Pulmonary Disease

## 2020-09-01 ENCOUNTER — Ambulatory Visit (HOSPITAL_BASED_OUTPATIENT_CLINIC_OR_DEPARTMENT_OTHER)
Admission: RE | Admit: 2020-09-01 | Discharge: 2020-09-01 | Disposition: A | Discharge status: Home / Self Care | Payer: BC Managed Care – PPO | Source: Ambulatory Visit | Attending: Internal Medicine | Admitting: Internal Medicine

## 2020-09-01 ENCOUNTER — Ambulatory Visit: Payer: BC Managed Care – PPO | Admitting: Pulmonary Disease

## 2020-09-01 ENCOUNTER — Other Ambulatory Visit: Payer: Self-pay

## 2020-09-01 VITALS — BP 124/70 | HR 92 | Temp 97.5°F | Ht 66.5 in | Wt 183.8 lb

## 2020-09-01 DIAGNOSIS — I272 Pulmonary hypertension, unspecified: Secondary | ICD-10-CM | POA: Diagnosis present

## 2020-09-01 DIAGNOSIS — J849 Interstitial pulmonary disease, unspecified: Secondary | ICD-10-CM | POA: Diagnosis not present

## 2020-09-01 NOTE — Progress Notes (Addendum)
[name]    YE:[id number]    [birth year]  Primary Care Physician:Taavon, Delfino Lovett, MD  Referring Physician: Bo Merino, MD [place] [address] [place],  [place] [address]  Chief complaint: Consult for interstitial lung disease  HPI: 50 year old with recent diagnosis of mixed connective tissue disease.  Follows with Dr. Estanislado Pandy  Diagnosed with mixed connective tissue disease in March 2021.  Therapies have included plaquenil in the past which caused a rash and shortness of breath.  She also was given Imuran which caused headaches abdominal pain and flulike symptoms.  Not receiving any treatment at present Refer to pulmonary for chest x-ray showing interstitial abnormalities and PFT showing reduction in diffusion capacity next.  She has recently seen Dr. Haroldine Laws for evaluation of pulmonary hypertension. High-res CT is scheduled for later today  Pets: Has a dog, no birds Occupation: Metallurgist in school Exposures: Has feather duvet, does gardening and husband is growing mushrooms at home [started early 2022].  She uses occasional nitrofurantoin for recurrent UTIs but not on a continuous basis. ILD questionnaire 08/26/2020-as above Smoking history: Non-smoker Travel history: No significant travel history Relevant family history: No family history of lung disease   Outpatient Encounter Medications as of 09/01/2020  Medication Sig   EPINEPHrine (AUVI-Q) 0.3 mg/0.3 mL IJ SOAJ injection Use as directed for severe allergic reaction   famotidine (PEPCID) 40 MG tablet TAKE 1 TABLET(40 MG) BY MOUTH EVERY EVENING   fluticasone (FLONASE) 50 MCG/ACT nasal spray Use one to two sprays in each nostril once daily as directed.   hydrOXYzine (ATARAX/VISTARIL) 25 MG tablet Take 1 tablet (25 mg total) by mouth 3 (three) times daily as needed.   levothyroxine (SYNTHROID, LEVOTHROID) 100 MCG tablet Take 1 tablet (100 mcg total) by mouth daily.   LORazepam (ATIVAN) 0.5 MG  tablet Take 1-2 tablets (0.5-1 mg total) by mouth at bedtime as needed for anxiety.   naproxen sodium (ALEVE) 220 MG tablet Take 220 mg by mouth as needed.   nitrofurantoin, macrocrystal-monohydrate, (MACROBID) 100 MG capsule Take 100 mg by mouth as needed.   omeprazole (PRILOSEC) 40 MG capsule Take 1 capsule (40 mg total) by mouth daily.   No facility-administered encounter medications on file as of 09/01/2020.    Allergies as of 09/01/2020 - Review Complete 09/01/2020  Allergen Reaction Noted   Augmentin [amoxicillin-pot clavulanate] Swelling 02/02/2018   Imuran [azathioprine]  08/05/2020   Other  05/04/2020   Penicillins  05/04/2020   Sulfa antibiotics Shortness Of Breath 11/17/2015   Vibramycin [doxycycline]  07/26/2016   Doxycycline calcium Swelling 04/03/2013   Latex Rash 02/02/2018   Plaquenil [hydroxychloroquine] Rash 08/24/2020    Past Medical History:  Diagnosis Date   Allergy    Anxiety    Complication of anesthesia    " wakes up  crying"   Depression    Eczema    GERD (gastroesophageal reflux disease)    Hypothyroidism    Interstitial cystitis    Ligament tear    Left elbow   Menorrhagia    Migraines    PONV (postoperative nausea and vomiting)    likes phenergan and scopolamine patch   Tennis elbow    Tennis elbow    Thyroid disease    Trigger finger of right hand    Urticaria     Past Surgical History:  Procedure Laterality Date   carpal tunnel release Bilateral 2010   tennis elbow also done   CESAREAN SECTION  x 2   CHOLECYSTECTOMY  2003   DIAGNOSTIC LAPAROSCOPY  20002   thru belly button to check for endometriosis   [place] N/A 11/18/2019   Procedure: DILATATION & CURETTAGE/HYSTEROSCOPY WITH NOVASURE ABLATION;  Surgeon: Brien Few, MD;  Location: [facility];  Service: Gynecology;  Laterality: N/A;   ELBOW SURGERY      Family History  Problem Relation Age of Onset    Thyroid disease Mother    Hypertension Father    Hyperlipidemia Father    Cancer Maternal Grandmother    Diabetes Maternal Grandmother    Hypertension Maternal Grandmother    Heart disease Maternal Grandfather    Hyperlipidemia Maternal Grandfather    Hypertension Maternal Grandfather    Diabetes Paternal Grandmother    Stroke Paternal Grandmother    Diabetes Paternal Grandfather    Heart disease Paternal Grandfather    Hyperlipidemia Paternal Grandfather    Hypertension Paternal Grandfather     Social History   Socioeconomic History   Marital status: Significant Other    Spouse name: Not on file   Number of children: Not on file   Years of education: Not on file   Highest education level: Not on file  Occupational History   Not on file  Tobacco Use   Smoking status: Never   Smokeless tobacco: Never  Vaping Use   Vaping Use: Never used  Substance and Sexual Activity   Alcohol use: Yes    Alcohol/week: 2.0 standard drinks    Types: 2 Glasses of wine per week    Comment: a few times weekly   Drug use: No   Sexual activity: Not on file  Other Topics Concern   Not on file  Social History Narrative   Not on file   Social Determinants of Health   Financial Resource Strain: Not on file  Food Insecurity: Not on file  Transportation Needs: Not on file  Physical Activity: Not on file  Stress: Not on file  Social Connections: Not on file  Intimate Partner Violence: Not on file    Review of systems: Review of Systems  Constitutional: Negative for fever and chills.  HENT: Negative.   Eyes: Negative for blurred vision.  Respiratory: as per HPI  Cardiovascular: Negative for chest pain and palpitations.  Gastrointestinal: Negative for vomiting, diarrhea, blood per rectum. Genitourinary: Negative for dysuria, urgency, frequency and hematuria.  Musculoskeletal: Negative for myalgias, back pain and joint pain.  Skin: Negative for itching and rash.  Neurological:  Negative for dizziness, tremors, focal weakness, seizures and loss of consciousness.  Endo/Heme/Allergies: Negative for environmental allergies.  Psychiatric/Behavioral: Negative for depression, suicidal ideas and hallucinations.  All other systems reviewed and are negative.  Physical Exam: Blood pressure 124/70, pulse 92, temperature (!) 97.5 F (36.4 C), temperature source Oral, height 5' 6.5" (1.689 m), weight 183 lb 12.8 oz (83.4 kg), SpO2 97 %. Gen:      No acute distress HEENT:  EOMI, sclera anicteric Neck:     No masses; no thyromegaly Lungs:    Clear to auscultation bilaterally; normal respiratory effort CV:         Regular rate and rhythm; no murmurs Abd:      + bowel sounds; soft, non-tender; no palpable masses, no distension Ext:    No edema; adequate peripheral perfusion Skin:      Warm and dry; no rash Neuro: alert and oriented x 3 Psych: normal mood and affect  Data Reviewed: Imaging:  Chest x-ray 07/02/2020-mild interstitial prominence I have reviewed the images personally.  PFTs: 07/14/2020 FVC 3.41 [89%], FEV1 2.92 [96%], F/F 86, TLC 96%, DLCO 16.82 [73%] Minimally reduced diffusion capacity  Labs: CTD serologies 05/04/2020-ANA 1:80, nuclear speckled, ribonucleoprotein 3.6  Cardiac: Echocardiogram 08/13/2020- EF 65-65%, normal RV systolic size and function, trivial TR signal inadequate for assessing PA pressure  Assessment:  Evaluation for ILD She has recent diagnosis of mixed connective tissue disease presenting as polyarthralgia, hand pain and synovitis Chest x-ray reviewed with mild interstitial prominence and PFTs with minimally reduced diffusion capacity She has a high-res CT scheduled for later today which I will review for presence of interstitial lung disease Based on the finding we will discuss treatment plans with Dr. Estanislado Pandy  She does have exposure to down pillows and CT scan will be reviewed for findings of hypersensitivity pneumonitis.  I have  advised her to get rid of the pillows to minimize exposure.  Send labs for hypersensitivity panel  Plan/Recommendations: Review high-resolution CT Get rid of down pillows Hypersensitivity panel labs  Marshell Garfinkel MD [facility] 09/01/2020, 9:59 AM  CC: Bo Merino, MD

## 2020-09-01 NOTE — Patient Instructions (Signed)
We will check blood test for hypersensitivity panel today I will review the CT scan when completed Follow-up in 2 to 4 weeks to review and plan for next steps.

## 2020-09-03 ENCOUNTER — Encounter: Payer: Self-pay | Admitting: Pulmonary Disease

## 2020-09-03 ENCOUNTER — Telehealth: Payer: Self-pay | Admitting: Pulmonary Disease

## 2020-09-03 NOTE — Telephone Encounter (Signed)
Spoke with patient regarding CT scan results. They verbalized understanding. She asked about lab work advised her that its only been 2 days and it doesn't look resulted yet so we would call her once we got the results. She expressed understanding. No further questions.  Nothing further needed at this time.

## 2020-09-03 NOTE — Telephone Encounter (Signed)
Please call inform patient that I have reviewed his CT scan which does not show any lung issues or involvement of the lungs from her connective tissue disease.  We will discuss with Dr. Estanislado Pandy and with patient in detail at time of return clinic visit

## 2020-09-08 ENCOUNTER — Ambulatory Visit: Payer: BC Managed Care – PPO | Admitting: Allergy and Immunology

## 2020-09-09 ENCOUNTER — Ambulatory Visit: Payer: BC Managed Care – PPO | Admitting: Rheumatology

## 2020-09-09 DIAGNOSIS — Z8261 Family history of arthritis: Secondary | ICD-10-CM

## 2020-09-09 DIAGNOSIS — M19041 Primary osteoarthritis, right hand: Secondary | ICD-10-CM

## 2020-09-09 DIAGNOSIS — E7800 Pure hypercholesterolemia, unspecified: Secondary | ICD-10-CM

## 2020-09-09 DIAGNOSIS — Z79899 Other long term (current) drug therapy: Secondary | ICD-10-CM

## 2020-09-09 DIAGNOSIS — M7918 Myalgia, other site: Secondary | ICD-10-CM

## 2020-09-09 DIAGNOSIS — M545 Low back pain, unspecified: Secondary | ICD-10-CM

## 2020-09-09 DIAGNOSIS — R5383 Other fatigue: Secondary | ICD-10-CM

## 2020-09-09 DIAGNOSIS — F411 Generalized anxiety disorder: Secondary | ICD-10-CM

## 2020-09-09 DIAGNOSIS — L500 Allergic urticaria: Secondary | ICD-10-CM

## 2020-09-09 DIAGNOSIS — L270 Generalized skin eruption due to drugs and medicaments taken internally: Secondary | ICD-10-CM

## 2020-09-09 DIAGNOSIS — G8929 Other chronic pain: Secondary | ICD-10-CM

## 2020-09-09 DIAGNOSIS — M7062 Trochanteric bursitis, left hip: Secondary | ICD-10-CM

## 2020-09-09 DIAGNOSIS — Z840 Family history of diseases of the skin and subcutaneous tissue: Secondary | ICD-10-CM

## 2020-09-09 DIAGNOSIS — M351 Other overlap syndromes: Secondary | ICD-10-CM

## 2020-09-09 DIAGNOSIS — E039 Hypothyroidism, unspecified: Secondary | ICD-10-CM

## 2020-09-09 DIAGNOSIS — M7712 Lateral epicondylitis, left elbow: Secondary | ICD-10-CM

## 2020-09-09 DIAGNOSIS — K9041 Non-celiac gluten sensitivity: Secondary | ICD-10-CM

## 2020-09-18 NOTE — Progress Notes (Addendum)
Office Visit Note  Patient: [name]             Date of Birth: 1970/12/11           MRN: [id number]             PCP: Brien Few, MD Referring: Brien Few, MD Visit Date: 10/02/2020 Occupation: '@GUAROCC'$ @  Subjective:  Arthritis (Stiffness)   History of Present Illness: [name] is a 50 y.o. female history of mixed connective tissue disease.  She states she continues to have some stiffness in her joints.  She has been having recurrent problems with the right hand trigger fingers.  She had trigger finger injections by Dr. Apolonio Schneiders but due to recurrence of her symptoms she will be undergoing a right third and fourth finger trigger finger release.  She does not have any trochanteric bursitis now.  She continues to have some discomfort in her lower extremities.  She also has lower back pain.  She has been having discomfort in bilateral lateral epicondyle region.  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  She was evaluated by Dr. Vaughan Browner for shortness of breath.  Chest x-ray and high-resolution CT was obtained but there was no evidence of ILD.  Activities of Daily Living:  Patient reports morning stiffness for 30 minutes.   Patient Reports nocturnal pain.  Difficulty dressing/grooming: Reports Difficulty climbing stairs: Reports Difficulty getting out of chair: Denies Difficulty using hands for taps, buttons, cutlery, and/or writing: Denies  Review of Systems  Constitutional:  Positive for fatigue.  HENT:  Positive for mouth dryness.   Eyes:  Negative for dryness.  Respiratory:  Negative for shortness of breath.   Cardiovascular:  Negative for swelling in legs/feet.  Gastrointestinal:  Negative for constipation.  Endocrine: Negative for excessive thirst.  Genitourinary:  Negative for difficulty urinating.  Musculoskeletal:  Positive for joint pain, joint pain, morning stiffness and muscle  tenderness. Negative for joint swelling.  Skin:  Negative for rash.  Allergic/Immunologic: Positive for susceptible to infections.  Neurological:  Negative for numbness.  Hematological:  Negative for bruising/bleeding tendency.  Psychiatric/Behavioral:  Positive for sleep disturbance.    PMFS History:  Patient Active Problem List   Diagnosis Date Noted   Mixed connective tissue disease ([place]) 10/02/2020   Primary osteoarthritis of both hands 10/02/2020   Rash and other nonspecific skin eruption 05/29/2020   Generalized anxiety disorder 02/02/2018   Allergic urticaria 02/02/2018   Acquired hypothyroidism 02/02/2018   Elevated cholesterol 02/02/2018    Past Medical History:  Diagnosis Date   Allergy    Anxiety    Complication of anesthesia    " wakes up  crying"   Depression    Eczema    GERD (gastroesophageal reflux disease)    Hypothyroidism    Interstitial cystitis    Ligament tear    Left elbow   Menorrhagia    Migraines    PONV (postoperative nausea and vomiting)    likes phenergan and scopolamine patch   Tennis elbow    Tennis elbow    Thyroid disease    Trigger finger of right hand    Urticaria     Family History  Problem Relation Age of Onset   Thyroid disease Mother    Hypertension Father    Hyperlipidemia Father    Cancer Maternal Grandmother    Diabetes Maternal Grandmother    Hypertension Maternal Grandmother    Heart disease Maternal  Grandfather    Hyperlipidemia Maternal Grandfather    Hypertension Maternal Grandfather    Diabetes Paternal Grandmother    Stroke Paternal Grandmother    Diabetes Paternal Grandfather    Heart disease Paternal Grandfather    Hyperlipidemia Paternal Grandfather    Hypertension Paternal Grandfather    Past Surgical History:  Procedure Laterality Date   carpal tunnel release Bilateral 2010   tennis elbow also done   CESAREAN SECTION     x 2   CHOLECYSTECTOMY  2003   DIAGNOSTIC LAPAROSCOPY  20002   thru belly  button to check for endometriosis   DILITATION & CURRETTAGE/HYSTROSCOPY WITH NOVASURE ABLATION N/A 11/18/2019   Procedure: DILATATION & CURETTAGE/HYSTEROSCOPY WITH NOVASURE ABLATION;  Surgeon: Brien Few, MD;  Location: [place];  Service: Gynecology;  Laterality: N/A;   ELBOW SURGERY     Social History   Social History Narrative   Not on file   Immunization History  Administered Date(s) Administered   Influenza,inj,Quad PF,6+ Mos 11/17/2015, 11/18/2016, 12/08/2017, 12/14/2018   Influenza-Unspecified 12/08/2017   Moderna Sars-Covid-2 Vaccination 02/04/2020   PFIZER Comirnaty(Gray Top)Covid-19 Tri-Sucrose Vaccine 04/12/2019, 05/03/2019     Objective: Vital Signs: BP 120/79 (BP Location: Left Arm, Patient Position: Sitting, Cuff Size: Normal)   Pulse 69   Resp 14   Ht 5' 6.5" (1.689 m)   Wt 182 lb 9.6 oz (82.8 kg)   BMI 29.03 kg/m    Physical Exam Vitals and nursing note reviewed.  Constitutional:      Appearance: She is well-developed.  HENT:     Head: Normocephalic and atraumatic.  Eyes:     Conjunctiva/sclera: Conjunctivae normal.  Cardiovascular:     Rate and Rhythm: Normal rate and regular rhythm.     Heart sounds: Normal heart sounds.  Pulmonary:     Effort: Pulmonary effort is normal.     Breath sounds: Normal breath sounds.  Abdominal:     General: Bowel sounds are normal.     Palpations: Abdomen is soft.  Musculoskeletal:     Cervical back: Normal range of motion.  Lymphadenopathy:     Cervical: No cervical adenopathy.  Skin:    General: Skin is warm and dry.     Capillary Refill: Capillary refill takes less than 2 seconds.  Neurological:     Mental Status: She is alert and oriented to person, place, and time.  Psychiatric:        Behavior: Behavior normal.     Musculoskeletal Exam: C-spine thoracic and lumbar spine were in good range of motion.  She has discomfort range of motion of her lumbar spine.  Shoulder joints, elbow  joints, wrist joints with good range of motion.  There was no tenderness over MCPs PIPs or DIPs.  No synovitis was noted.  She had tenderness over bilateral lateral epicondyle region.  She has left third and fourth trigger finger.  Hip joints and knee joints in good range of motion.  There was no tenderness over ankles or MTPs.  CDAI Exam: CDAI Score: -- Patient Global: --; Provider Global: -- Swollen: --; Tender: -- Joint Exam 10/02/2020   No joint exam has been documented for this visit   There is currently no information documented on the homunculus. Go to the Rheumatology activity and complete the homunculus joint exam.  Investigation: No additional findings.  Imaging: No results found.  Recent Labs: Lab Results  Component Value Date   WBC 8.3 08/05/2020   HGB 13.7 08/05/2020   PLT  334 08/05/2020   NA 138 08/05/2020   K 3.9 08/05/2020   CL 102 08/05/2020   CO2 27 08/05/2020   GLUCOSE 77 08/05/2020   BUN 10 08/05/2020   CREATININE 0.71 08/05/2020   BILITOT 0.4 08/05/2020   ALKPHOS 119 (H) 02/02/2018   AST 19 08/05/2020   ALT 21 08/05/2020   PROT 6.8 08/05/2020   ALBUMIN 4.6 02/02/2018   CALCIUM 9.3 08/05/2020   GFRAA 114 08/05/2020   QFTBGOLDPLUS NEGATIVE 05/04/2020    Speciality Comments: PLQ EYE EXAM 06/23/2020 WNL PLQ- , rash, sob,  Imuran-headache, abdominal pain,flu like symptoms 09/02/20 HRCT negative for ILD,08/13/20 ECHO cardio  Procedures:  No procedures performed Allergies: Augmentin [amoxicillin-pot clavulanate], Imuran [azathioprine], Other, Penicillins, Sulfa antibiotics, Vibramycin [doxycycline], Doxycycline calcium, Latex, and Plaquenil [hydroxychloroquine]   Assessment / Plan:     Visit Diagnoses: 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  No synovitis was noted.  She continues to complain of fatigue, joint inflammation  and sicca symptoms.  She has an appointment coming up at [facility] for the second opinion.  I do not see any synovitis on examination.  I will obtain AVISE labs.  I will discuss results once available.  She also had evaluation by Dr. Haroldine Laws and Dr. Vaughan Browner.  Echocardiogram was negative and high-resolution CT was negative for interstitial lung disease.  I believe the shortness of breath is coming from anxiety.  The CT scan also showed  coronary calcification.  I advised her to schedule an appointment with Dr. Haroldine Laws to discuss this further.  High risk medication use - she tried Plaquenil in the past which caused a rash and shortness of breath.  She also was given Imuran which caused headaches abdominal pain and flulike symptoms.  I do not see much inflammation on the examination today.  I do not see any DMARDs are necessary at this point.  Lateral epicondylitis, left elbow - she had MRI of her left elbow joint at [facility] which showed severe common extensor tendon tendinitis.  I advised her bilateral tennis elbow braces.  Primary osteoarthritis of both hands-bilateral PIP and DIP thickening was noted.  Joint protection muscle strengthening was discussed.  She has left third and fourth trigger finger.  She has had an adequate response to cortisone injections.  She will be getting trigger finger release.  Chronic midline low back pain without sciatica - X-ray showed dextroscoliosis, facet joint arthropathy and some degenerative changes.  She continues to have lower back pain and has been followed by orthopedics.  Other fatigue-she continues to have fatigue related to fibromyalgia.  Myofascial pain-she has generalized pain, positive tender points and fatigue.  Need for regular exercise was emphasized.  Benefits of stretching exercises and yoga and relaxation was discussed.  Generalized anxiety disorder  NCGS (non-celiac gluten sensitivity)-she is doing better on gluten-free diet.  Allergic urticaria  - By Dr. Neldon Mc  Acquired hypothyroidism  Elevated cholesterol  Family history of rheumatoid arthritis - Great grandmother  Family history of lupus erythematosus - First cousin  Drug-induced skin rash - Advised to discontinue plaquenil due to drug reaction.    Orders: No orders of the defined types were placed in this encounter.  No orders of the defined types were placed in this encounter.    Follow-Up Instructions: Return in about 6 weeks (around 11/13/2020) for Autoimmune disease.   Bo Merino, MD  Note - This record has  been created using [employer].  Chart creation errors have been sought, but may not always  have been located. Such creation errors do not reflect on  the standard of medical care.

## 2020-09-24 ENCOUNTER — Other Ambulatory Visit: Payer: Self-pay

## 2020-09-24 ENCOUNTER — Encounter: Payer: Self-pay | Admitting: Pulmonary Disease

## 2020-09-24 ENCOUNTER — Ambulatory Visit: Payer: BC Managed Care – PPO | Admitting: Pulmonary Disease

## 2020-09-24 VITALS — BP 116/64 | HR 85 | Temp 98.2°F | Ht 67.0 in | Wt 182.0 lb

## 2020-09-24 DIAGNOSIS — J849 Interstitial pulmonary disease, unspecified: Secondary | ICD-10-CM | POA: Diagnosis not present

## 2020-09-24 NOTE — Patient Instructions (Signed)
I have reviewed his CT scan which does not show significant lung issues Will send message to Dr. Estanislado Pandy regarding treatment options which can be targeted to the joints  His CT scan also shows some calcium buildup in the blood vessel of the heart.  Please follow-up with Dr. Haroldine Laws and your primary care doctor regarding this. Recommended lifestyle modification and cholesterol management  Follow-up in 6 months

## 2020-09-24 NOTE — Progress Notes (Signed)
[name]    [id number]    02-02-1971  Primary Care Physician:Taavon, Delfino Lovett, MD  Referring Physician: Brien Few, MD [name],  [name] [id number]  Chief complaint: Follow-up for interstitial lung disease evaluation, mixed connective tissue disease  HPI: 50 year old with recent diagnosis of mixed connective tissue disease.  Follows with Dr. Estanislado May  Diagnosed with mixed connective tissue disease in March 2021.  Therapies have included plaquenil in the past which caused a rash and shortness of breath.  She also was given Imuran which caused headaches abdominal pain and flulike symptoms.  Not receiving any treatment at present Refer to pulmonary for chest x-ray showing interstitial abnormalities and PFT showing reduction in diffusion capacity next.  She has seen Dr. Haroldine May for evaluation of pulmonary hypertension. High-res CT is scheduled for later today  Pets: Has a dog, no birds Occupation: Metallurgist in school Exposures: Has feather duvet, does gardening and husband is growing mushrooms at home [started early 2022].  She uses occasional nitrofurantoin for recurrent UTIs but not on a continuous basis. ILD questionnaire 08/26/2020-as above Smoking history: Non-smoker Travel history: No significant travel history Relevant family history: No family history of lung disease  Interim history: She is here for review of CT scan.  No lung symptoms No dyspnea, cough  Continues to have joint pain and stiffness  Outpatient Encounter Medications as of 09/24/2020  Medication Sig   EPINEPHrine (AUVI-Q) 0.3 mg/0.3 mL IJ SOAJ injection Use as directed for severe allergic reaction   famotidine (PEPCID) 40 MG tablet TAKE 1 TABLET(40 MG) BY MOUTH EVERY EVENING   fluticasone (FLONASE) 50 MCG/ACT nasal spray Use one to two sprays in each nostril once daily as directed.   hydrOXYzine (ATARAX/VISTARIL) 25 MG tablet Take 1 tablet (25 mg total) by mouth 3  (three) times daily as needed.   levothyroxine (SYNTHROID, LEVOTHROID) 100 MCG tablet Take 1 tablet (100 mcg total) by mouth daily.   LORazepam (ATIVAN) 0.5 MG tablet Take 1-2 tablets (0.5-1 mg total) by mouth at bedtime as needed for anxiety.   naproxen sodium (ALEVE) 220 MG tablet Take 220 mg by mouth as needed.   nitrofurantoin, macrocrystal-monohydrate, (MACROBID) 100 MG capsule Take 100 mg by mouth as needed.   omeprazole (PRILOSEC) 40 MG capsule Take 1 capsule (40 mg total) by mouth daily.   No facility-administered encounter medications on file as of 09/24/2020.    Physical Exam: Blood pressure 116/64, pulse 85, temperature 98.2 F (36.8 C), temperature source Oral, height '5\' 7"'$  (1.702 m), weight 182 lb (82.6 kg), SpO2 97 %. Gen:      No acute distress HEENT:  EOMI, sclera anicteric Neck:     No masses; no thyromegaly Lungs:    Clear to auscultation bilaterally; normal respiratory effort CV:         Regular rate and rhythm; no murmurs Abd:      + bowel sounds; soft, non-tender; no palpable masses, no distension Ext:    No edema; adequate peripheral perfusion Skin:      Warm and dry; no rash Neuro: alert and oriented x 3 Psych: normal mood and affect   Data Reviewed: Imaging: Chest x-ray 07/02/2020-mild interstitial prominence  High-resolution CT 09/01/2020-no interstitial lung disease, mild biapical pleural scarring, LABA trapping, left anterior coronary artery calcification. I have reviewed the images personally.  PFTs: 07/14/2020 FVC 3.41 [89%], FEV1 2.92 [96%], F/F 86, TLC 96%, DLCO 16.82 [73%] Minimally reduced diffusion capacity  Labs:  CTD serologies 05/04/2020-ANA 1:80, nuclear speckled, ribonucleoprotein 3.6 Hypersensitivity panel 09/01/2020-sent on 09/01/2020.  Results delayed due to reagent backorder  Cardiac: Echocardiogram 08/13/2020- EF 65-65%, normal RV systolic size and function, trivial TR signal inadequate for assessing PA pressure  Assessment: Not resolved.   Reduce is collected.  I will have her delayed expect resolution F/F Evaluation for ILD She has recent diagnosis of mixed connective tissue disease presenting as polyarthralgia, hand pain and synovitis PFTs with minimally reduced diffusion capacity which is likely secondary to body habitus She is going to get rid of her down comforter, hypersensitivity panel is still pending  Thankfully there is no evidence of interstitial lung disease on CT scan.  I have discussed this with patient and reassured her She does not need any immunosuppressive agents specific for the lung and can be tried on therapy is targeted to her polyarthralgia.  We will send message to Dr. Estanislado May, rheumatology  Coronary atherosclerosis CT reviewed with left anterior descending coronary artery calcification. Will likely need lifestyle modification and possibly further evaluation with cardiac CT She is in the process of getting a new primary, will CC Dr. Haroldine May  Plan/Recommendations: Follow-up in 6 months Consider immunosuppressive therapy targeted to polyarthralgia  Andrea Garfinkel MD [facility] 09/24/2020, 8:30 AM  CC: Andrea Few, MD

## 2020-10-02 ENCOUNTER — Ambulatory Visit: Payer: BC Managed Care – PPO | Admitting: Rheumatology

## 2020-10-02 ENCOUNTER — Encounter: Payer: Self-pay | Admitting: Rheumatology

## 2020-10-02 ENCOUNTER — Other Ambulatory Visit: Payer: Self-pay

## 2020-10-02 VITALS — BP 120/79 | HR 69 | Resp 14 | Ht 66.5 in | Wt 182.6 lb

## 2020-10-02 DIAGNOSIS — M7061 Trochanteric bursitis, right hip: Secondary | ICD-10-CM

## 2020-10-02 DIAGNOSIS — M351 Other overlap syndromes: Secondary | ICD-10-CM | POA: Insufficient documentation

## 2020-10-02 DIAGNOSIS — M359 Systemic involvement of connective tissue, unspecified: Secondary | ICD-10-CM

## 2020-10-02 DIAGNOSIS — G8929 Other chronic pain: Secondary | ICD-10-CM

## 2020-10-02 DIAGNOSIS — Z840 Family history of diseases of the skin and subcutaneous tissue: Secondary | ICD-10-CM

## 2020-10-02 DIAGNOSIS — M19042 Primary osteoarthritis, left hand: Secondary | ICD-10-CM

## 2020-10-02 DIAGNOSIS — L500 Allergic urticaria: Secondary | ICD-10-CM

## 2020-10-02 DIAGNOSIS — Z79899 Other long term (current) drug therapy: Secondary | ICD-10-CM | POA: Diagnosis not present

## 2020-10-02 DIAGNOSIS — E7800 Pure hypercholesterolemia, unspecified: Secondary | ICD-10-CM

## 2020-10-02 DIAGNOSIS — M19041 Primary osteoarthritis, right hand: Secondary | ICD-10-CM | POA: Diagnosis not present

## 2020-10-02 DIAGNOSIS — M7712 Lateral epicondylitis, left elbow: Secondary | ICD-10-CM | POA: Diagnosis not present

## 2020-10-02 DIAGNOSIS — M7918 Myalgia, other site: Secondary | ICD-10-CM

## 2020-10-02 DIAGNOSIS — L270 Generalized skin eruption due to drugs and medicaments taken internally: Secondary | ICD-10-CM

## 2020-10-02 DIAGNOSIS — M545 Low back pain, unspecified: Secondary | ICD-10-CM

## 2020-10-02 DIAGNOSIS — Z8261 Family history of arthritis: Secondary | ICD-10-CM

## 2020-10-02 DIAGNOSIS — E039 Hypothyroidism, unspecified: Secondary | ICD-10-CM

## 2020-10-02 DIAGNOSIS — K9041 Non-celiac gluten sensitivity: Secondary | ICD-10-CM

## 2020-10-02 DIAGNOSIS — F411 Generalized anxiety disorder: Secondary | ICD-10-CM

## 2020-10-02 DIAGNOSIS — R5383 Other fatigue: Secondary | ICD-10-CM

## 2020-10-02 LAB — HYPERSENSITIVITY PNUEMONITIS PROFILE
ASPERGILLUS FUMIGATUS: NEGATIVE
Faenia retivirgula: NEGATIVE
Pigeon Serum: NEGATIVE
S. VIRIDIS: NEGATIVE
T. CANDIDUS: NEGATIVE
T. VULGARIS: NEGATIVE

## 2020-10-13 ENCOUNTER — Ambulatory Visit: Payer: BC Managed Care – PPO | Admitting: Pulmonary Disease

## 2020-10-14 DIAGNOSIS — N3010 Interstitial cystitis (chronic) without hematuria: Secondary | ICD-10-CM | POA: Insufficient documentation

## 2020-10-14 DIAGNOSIS — M779 Enthesopathy, unspecified: Secondary | ICD-10-CM | POA: Insufficient documentation

## 2020-10-15 ENCOUNTER — Other Ambulatory Visit: Payer: Self-pay | Admitting: Allergy and Immunology

## 2020-11-05 NOTE — Progress Notes (Signed)
Office Visit Note  Patient: [name]             Date of Birth: Mar 01, 1970           MRN: [id number]             PCP: Brien Few, MD Referring: Brien Few, MD Visit Date: 11/19/2020 Occupation: @GUAROCC @  Subjective:  Discuss AVISE   History of Present Illness: [name] is a 49 y.o. female with history of undifferentiated connective tissue disease, osteoarthritis, myofascial pain.  Patient is currently not taking any immunosuppressive agents.  She previously had an intolerance to Plaquenil and Imuran.  She presents today to [facility] labs from 10/21/20.  She reports that since her last office visit she has ongoing fatigue and arthralgias.  She continues to go to physical therapy for lateral epicondylitis of the left elbow.  She is scheduled for trigger finger release for the right third and fourth digits on 12/03/2020 with Dr. Caralyn Guile.   The patient's medication list was reviewed today in detail.  She has been taking omeprazole 2-3 times per week and Synthroid and Zyrtec on a daily basis.   Activities of Daily Living:  Patient reports morning stiffness for 20-30 minutes.   Patient Reports nocturnal pain.  Difficulty dressing/grooming: Denies Difficulty climbing stairs: Denies Difficulty getting out of chair: Denies Difficulty using hands for taps, buttons, cutlery, and/or writing: Reports  Review of Systems  Constitutional:  Positive for fatigue.  HENT:  Positive for mouth dryness and nose dryness. Negative for mouth sores.   Eyes:  Positive for itching and dryness. Negative for pain.  Respiratory:  Negative for shortness of breath and difficulty breathing.   Cardiovascular:  Negative for chest pain and palpitations.  Gastrointestinal:  Negative for blood in stool, constipation and diarrhea.  Endocrine: Negative for increased urination.  Genitourinary:  Negative for difficulty urinating.  Musculoskeletal:  Positive for joint pain, joint pain, joint  swelling, myalgias, morning stiffness, muscle tenderness and myalgias.  Skin:  Positive for rash. Negative for color change.  Allergic/Immunologic: Positive for susceptible to infections.  Neurological:  Positive for headaches. Negative for dizziness, numbness, memory loss and weakness.  Hematological:  Positive for bruising/bleeding tendency.  Psychiatric/Behavioral:  Negative for confusion.    PMFS History:  Patient Active Problem List   Diagnosis Date Noted   Mixed connective tissue disease ([place]) 10/02/2020   Primary osteoarthritis of both hands 10/02/2020   Rash and other nonspecific skin eruption 05/29/2020   Generalized anxiety disorder 02/02/2018   Allergic urticaria 02/02/2018   Acquired hypothyroidism 02/02/2018   Elevated cholesterol 02/02/2018    Past Medical History:  Diagnosis Date   Allergy    Anxiety    Complication of anesthesia    " wakes up  crying"   Depression    Eczema    GERD (gastroesophageal reflux disease)    Hypothyroidism    Interstitial cystitis    Ligament tear    Left elbow   Menorrhagia    Migraines    PONV (postoperative nausea and vomiting)    likes phenergan and scopolamine patch   Tennis elbow    Tennis elbow    Thyroid disease    Trigger finger of right hand    Urticaria     Family History  Problem Relation Age of Onset   Thyroid disease Mother    Hypertension Father    Hyperlipidemia Father    Cancer Maternal Grandmother    Diabetes Maternal Grandmother  Hypertension Maternal Grandmother    Heart disease Maternal Grandfather    Hyperlipidemia Maternal Grandfather    Hypertension Maternal Grandfather    Diabetes Paternal Grandmother    Stroke Paternal Grandmother    Diabetes Paternal Grandfather    Heart disease Paternal Grandfather    Hyperlipidemia Paternal Grandfather    Hypertension Paternal Grandfather    Past Surgical History:  Procedure Laterality Date   carpal tunnel release Bilateral 2010   tennis elbow also  done   CESAREAN SECTION     x 2   CHOLECYSTECTOMY  2003   DIAGNOSTIC LAPAROSCOPY  20002   thru belly button to check for endometriosis   DILITATION & CURRETTAGE/HYSTROSCOPY WITH NOVASURE ABLATION N/A 11/18/2019   Procedure: DILATATION & CURETTAGE/HYSTEROSCOPY WITH NOVASURE ABLATION;  Surgeon: Brien Few, MD;  Location: [place] [facility] [place];  Service: Gynecology;  Laterality: N/A;   ELBOW SURGERY     Social History   Social History Narrative   Not on file   Immunization History  Administered Date(s) Administered   Influenza,inj,Quad PF,6+ Mos 11/17/2015, 11/18/2016, 12/08/2017, 12/14/2018   Influenza-Unspecified 12/08/2017   Moderna Sars-Covid-2 Vaccination 02/04/2020   PFIZER Comirnaty(Gray Top)Covid-19 Tri-Sucrose Vaccine 04/12/2019, 05/03/2019     Objective: Vital Signs: BP 120/72 (BP Location: Left Arm, Patient Position: Sitting, Cuff Size: Normal)   Pulse 82   Ht 5' 6.5" (1.689 m)   Wt 181 lb 3.2 oz (82.2 kg)   BMI 28.81 kg/m    Physical Exam Vitals and nursing note reviewed.  Constitutional:      Appearance: She is well-developed.  HENT:     Head: Normocephalic and atraumatic.  Eyes:     Conjunctiva/sclera: Conjunctivae normal.  Pulmonary:     Effort: Pulmonary effort is normal.  Abdominal:     Palpations: Abdomen is soft.  Musculoskeletal:     Cervical back: Normal range of motion.  Skin:    General: Skin is warm and dry.     Capillary Refill: Capillary refill takes less than 2 seconds.  Neurological:     Mental Status: She is alert and oriented to person, place, and time.  Psychiatric:        Behavior: Behavior normal.     Musculoskeletal Exam: C-spine, thoracic spine, lumbar spine have good range of motion with no discomfort.  Shoulder joints, elbow joints, wrist joints, MCPs, PIPs, DIPs have good range of motion with no synovitis.  Complete fist formation bilaterally.  Tenderness over the lateral epicondyle of the left elbow.  Possible, very  mild extensor tenosynovitis in the left wrist noted.  Hip joints have good range of motion.  Tenderness over bilateral trochanteric bursa.  Knee joints have good range of motion with no warmth or effusion.  Ankle joints have good range of motion with no tenderness or joint swelling.  No tenderness over MTP joints.  CDAI Exam: CDAI Score: -- Patient Global: --; Provider Global: -- Swollen: --; Tender: -- Joint Exam 11/19/2020   No joint exam has been documented for this visit   There is currently no information documented on the homunculus. Go to the Rheumatology activity and complete the homunculus joint exam.  Investigation: No additional findings.  Imaging: No results found.  Recent Labs: Lab Results  Component Value Date   WBC 8.3 08/05/2020   HGB 13.7 08/05/2020   PLT 334 08/05/2020   NA 138 11/13/2020   K 3.9 11/13/2020   CL 103 11/13/2020   CO2 27 11/13/2020   GLUCOSE 88 11/13/2020  BUN 6 11/13/2020   CREATININE 0.78 11/13/2020   BILITOT 0.7 11/13/2020   ALKPHOS 133 (H) 11/13/2020   AST 64 (H) 11/13/2020   ALT 78 (H) 11/13/2020   PROT 6.9 11/13/2020   ALBUMIN 4.0 11/13/2020   CALCIUM 9.2 11/13/2020   GFRAA 114 08/05/2020   QFTBGOLDPLUS NEGATIVE 05/04/2020    Speciality Comments: PLQ EYE EXAM 06/23/2020 WNL PLQ- , rash, sob,  Imuran-headache, abdominal pain,flu like symptoms 09/02/20 HRCT negative for ILD,08/13/20 ECHO cardio  Procedures:  No procedures performed Allergies: Augmentin [amoxicillin-pot clavulanate], Imuran [azathioprine], Other, Penicillins, Sulfa antibiotics, Vibramycin [doxycycline], Doxycycline calcium, Latex, and Plaquenil [hydroxychloroquine]   Assessment / Plan:     Visit Diagnoses: Undifferentiated connective tissue disease ([place]) - Positive ANA, positive RNP (negative on 10/21/20), dry mouth, and inflammatory arthritis: She presents today to discuss AV[facility] lab results from 10/21/20: lupus index: equivocal, ANA positive--negative titer, strong  positive anti-histone, and positive phosphatidylserine IgG.  Discussed results in detail today.  Reviewed medication list in detail.  Discussed that omeprazole has been associated with cutaneous lupus.  She has been taking omeprazole 2-3 times per week.  She does not appear to be on any other causative drugs for drug-induced lupus at this time.  Her symptoms of GERD have been well controlled with diet and taking Pepcid on a daily basis.  She was advised to discontinue omeprazole.  We will recheck AV[facility] labs in 3 months.    She was recently evaluated by Dr. Haroldine Laws on 11/13/2020 and had no evidence of PAH on echocardiogram.  Chest x-ray and PFTs in the past were suggestive of mild interstitial lung disease.  She was previously evaluated by Dr. Vaughan Browner and she had a high-resolution chest CT on 09/01/2020 which did not reveal any evidence of ILD.   She had an MRI soft tissue neck performed on 07/20/2020 which revealed a salt-and-pepper appearance of bilateral parotid glands suggestive of Sjogren's syndrome.  She has very mild symptoms of dry mouth and has not been experiencing any dry eyes.  Discussed that her lab work is not consistent with Sjogren's at this time but the objective MRI results definitely raise the suspicion for SS.  Discussed signs and symptoms to monitor for.  She does not require immunosuppressive therapy at this time.  She continues to experience fatigue on a daily basis as well as arthralgias intermittently.  She has possible very mild extensor tenosynovitis of the left wrist on examination today.  She was given a list of natural anti-inflammatories to start taking.  She was advised to avoid turmeric due to her history of reflux.  She was advised to notify us if she develops increased joint pain or joint swelling.  She does not require immunosuppressive therapy at this time. She will follow-up in the office in 3 months after updating AV[facility] labs.   High risk medication use - D/c PLQ-rash,  SOB, Imuran-HA, abdominal pain.  She does not require immunosuppressive therapy at this time.  Dry mouth: Mild.  She does not use any over-the-counter products since her symptoms are tolerable.  She tries to drink fluids throughout the day which manages her dry mouth. No objective evidence of ocular dryness.  Reviewed diagnostic criteria for Sjogren's syndrome. She had an MRI soft tissue neck performed on 07/20/2020 which revealed salt-and-pepper appearance of bilateral parotid glands without significant atrophy suggestive and intermediate stage Sjogren syndrome. No biopsy has been performed. Lab work from 10/21/2020 was reviewed with the patient today in the office: ANA positive, negative titer,  Ro antibody negative, and La antibody negative.  Discussed that Ro and La antibodies do not have to be positive to confirm the diagnosis but she does not have a clear diagnosis of systemic rheumatic disease at this time.  The MRI results definitely raise the suspicion for SS.  We will continue to monitor her symptoms and lab work closely.   Lateral epicondylitis, left elbow: She continues to go to physical therapy and performs home exercises.  Primary osteoarthritis of both hands: PIP and DIP prominence consistent with osteoarthritis of both hands.  Discussed importance of joint protection and muscle strengthening.  Other fatigue: She experiences fatigue on a daily basis.  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  She remains on Synthroid as prescribed for hypothyroidism.  Discussed the importance of regular exercise and good sleep hygiene.   Myofascial pain: She experiences intermittent myalgias and arthralgias.  Her symptoms are consistent with myofascial pain.  Discussed importance of regular exercise and good sleep hygiene.  Trochanteric bursitis of both hips: She has tenderness palpation over bilateral trochanteric bursa.  Discussed importance of performing stretching  exercises on a daily basis.  Trigger middle finger of right hand: Surgical release scheduled on 12/03/20 with Dr. Caralyn Guile.    Trigger ring finger of right hand: Surgical release scheduled on 12/03/20.    Other medical conditions are listed as follows:   Generalized anxiety disorder  NCGS (non-celiac gluten sensitivity): She has eliminated gluten from her diet for the past 2-3 months.   Allergic urticaria: Followed by allergist.  She takes zyrtec daily.   Acquired hypothyroidism: She remains on synthroid as prescribed.   Elevated cholesterol  Family history of rheumatoid arthritis  Family history of lupus erythematosus  Orders: No orders of the defined types were placed in this encounter.  No orders of the defined types were placed in this encounter.     Follow-Up Instructions: Return in about 3 months (around 02/19/2021) for +ANA.   Ofilia Neas, PA-C  Note - This record has been created using Dragon software.  Chart creation errors have been sought, but may not always  have been located. Such creation errors do not reflect on  the standard of medical care.

## 2020-11-10 ENCOUNTER — Other Ambulatory Visit: Payer: Self-pay

## 2020-11-10 ENCOUNTER — Ambulatory Visit: Payer: BC Managed Care – PPO | Admitting: Allergy and Immunology

## 2020-11-10 VITALS — BP 120/60 | HR 82 | Temp 98.0°F | Resp 16 | Ht 66.5 in | Wt 182.0 lb

## 2020-11-10 DIAGNOSIS — K219 Gastro-esophageal reflux disease without esophagitis: Secondary | ICD-10-CM | POA: Diagnosis not present

## 2020-11-10 DIAGNOSIS — J3089 Other allergic rhinitis: Secondary | ICD-10-CM | POA: Diagnosis not present

## 2020-11-10 DIAGNOSIS — H1013 Acute atopic conjunctivitis, bilateral: Secondary | ICD-10-CM | POA: Diagnosis not present

## 2020-11-10 DIAGNOSIS — L299 Pruritus, unspecified: Secondary | ICD-10-CM

## 2020-11-10 DIAGNOSIS — M359 Systemic involvement of connective tissue, unspecified: Secondary | ICD-10-CM

## 2020-11-10 DIAGNOSIS — R768 Other specified abnormal immunological findings in serum: Secondary | ICD-10-CM

## 2020-11-10 DIAGNOSIS — Z13820 Encounter for screening for osteoporosis: Secondary | ICD-10-CM

## 2020-11-10 DIAGNOSIS — M3500 Sicca syndrome, unspecified: Secondary | ICD-10-CM

## 2020-11-10 MED ORDER — CETIRIZINE HCL 10 MG PO TABS: 10.0000 mg | ORAL_TABLET | Freq: Every day | ORAL | 2 refills | Status: DC

## 2020-11-10 MED ORDER — OMEPRAZOLE 40 MG PO CPDR
40.0000 mg | DELAYED_RELEASE_CAPSULE | Freq: Every day | ORAL | 2 refills | Status: DC
Start: 2020-11-10 — End: 2021-06-09

## 2020-11-10 MED ORDER — FLUTICASONE PROPIONATE 50 MCG/ACT NA SUSP: NASAL | 5 refills | Status: DC

## 2020-11-10 MED ORDER — EPINEPHRINE 0.3 MG/0.3ML IJ SOAJ: INTRAMUSCULAR | 2 refills | Status: DC

## 2020-11-10 MED ORDER — FAMOTIDINE 40 MG PO TABS: ORAL_TABLET | ORAL | 2 refills | Status: DC

## 2020-11-10 NOTE — Progress Notes (Signed)
[facility]   Follow-up Note  Referring Provider: Shawnee Knapp, MD Primary Provider: Brien Few, MD Date of Office Visit: 11/10/2020  Subjective:   [name] (DOB: [birth year]) is a 50 y.o. 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  I last saw her in this clinic on 10 March 2020.  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  She does use her nasal steroid and an antihistamine on a consistent basis.  It does not sound as though she has required a systemic steroid or antibiotic for any type of airway issue.  However, she has been receiving systemic steroids multiple times per year for arthralgic issues.  When I last saw her in this clinic she had a positive RNP with polyarthralgia and we referred her to rheumatology and at that point time she was started on hydroxychloroquine but she developed a secondary skin reaction to that agent and then she was started on azathioprine and she once again developed a skin reaction and she shows me pictures from her cell phone and looks like she had erythema multiforme.  She did visit with cardiology and pulmonology and apparently there is not a cardiopulmonary involvement as a result of her autoimmune condition and she is presently being called undifferentiated connective tissue disease with polyarthralgia and sicca syndrome with positive RNP antibody.  It should be noted that she has been  exposed to systemic steroids on almost a monthly basis over the course of the past year having received systemic steroids for medication reactions and joint injections.  She now develops a reaction whenever she is exposed to a systemic steroid manifested as a flushing disorder affecting predominantly her face especially her cheeks but can involve other areas of her body and she just feels intensely hot whenever she has this medication exposure..  Allergies as of 11/10/2020       Reactions   Augmentin [amoxicillin-pot Clavulanate] Swelling   Pt has tolerated amoxicillin prior Black rings around eyes   Imuran [azathioprine]    Muscle aches, joint aches, fatigue, GI upset, itching, rash    Other    Other reaction(s): Chest Pain   Penicillins    Other reaction(s): Eye Swelling, Headache   Sulfa Antibiotics Shortness Of Breath   Other reaction(s): Unknown   Vibramycin [doxycycline]    Other reaction(s): Edema, Edema, Unknown   Doxycycline Calcium Swelling   Retained fluid   Latex Rash   Plaquenil [hydroxychloroquine] Rash        Medication List    cetirizine 10 MG tablet Commonly known as: ZYRTEC Take 1 tablet (10 mg total) by mouth daily. Started by: Jiles Prows, MD   EPINEPHrine 0.3 mg/0.3 mL Soaj injection Commonly known as: Auvi-Q Use as directed for severe allergic reaction   famotidine 40 MG tablet Commonly known as: PEPCID TAKE 1 TABLET(40 MG) BY MOUTH EVERY EVENING   fluticasone 50 MCG/ACT nasal spray Commonly known as: FLONASE SHAKE LIQUID AND USE 1 TO 2 SPRAYS  IN EACH NOSTRIL EVERY DAY AS DIRECTED   levothyroxine 100 MCG tablet Commonly known as: SYNTHROID Take 1 tablet (100 mcg total) by mouth daily.   LORazepam 0.5 MG tablet Commonly known as: ATIVAN Take 1-2 tablets (0.5-1 mg total) by mouth at bedtime as needed for anxiety.   naproxen sodium 220 MG tablet Commonly known as: ALEVE Take 220 mg by mouth as needed.   nitrofurantoin  (macrocrystal-monohydrate) 100 MG capsule Commonly known as: MACROBID Take 100 mg by mouth as needed.   omeprazole 40 MG capsule Commonly known as: PRILOSEC Take 1 capsule (40 mg total) by mouth daily.    Past Medical History:  Diagnosis Date   Allergy    Anxiety    Complication of anesthesia    " wakes up  crying"   Depression    Eczema    GERD (gastroesophageal reflux disease)    Hypothyroidism    Interstitial cystitis    Ligament tear    Left elbow   Menorrhagia    Migraines    PONV (postoperative nausea and vomiting)    likes phenergan and scopolamine patch   Tennis elbow    Tennis elbow    Thyroid disease    Trigger finger of right hand    Urticaria     Past Surgical History:  Procedure Laterality Date   carpal tunnel release Bilateral 2010   tennis elbow also done   CESAREAN SECTION     x 2   CHOLECYSTECTOMY  2003   DIAGNOSTIC LAPAROSCOPY  20002   thru belly button to check for endometriosis   DILITATION & CURRETTAGE/HYSTROSCOPY WITH NOVASURE ABLATION N/A 11/18/2019   Procedure: DILATATION & CURETTAGE/HYSTEROSCOPY WITH NOVASURE ABLATION;  Surgeon: Brien Few, MD;  Location: [facility];  Service: Gynecology;  Laterality: N/A;   ELBOW SURGERY      Review of systems negative except as noted in HPI / PMHx or noted below:  Review of Systems  Constitutional: Negative.   HENT: Negative.    Eyes: Negative.   Respiratory: Negative.    Cardiovascular: Negative.   Gastrointestinal: Negative.   Genitourinary: Negative.   Musculoskeletal: Negative.   Skin: Negative.   Neurological: Negative.   Endo/Heme/Allergies: Negative.   Psychiatric/Behavioral: Negative.      Objective:   Vitals:   11/10/20 1632  BP: 120/60  Pulse: 82  Resp: 16  Temp: 98 F (36.7 C)  SpO2: 97%   Height: 5' 6.5" (168.9 cm)  Weight: 182 lb (82.6 kg)   Physical Exam Constitutional:      Appearance: She is not diaphoretic.  HENT:     Head: Normocephalic.      Right Ear: Tympanic membrane, ear canal and external ear normal.     Left Ear: Tympanic membrane, ear canal and external ear normal.     Nose: Nose normal. No mucosal edema or rhinorrhea.     Mouth/Throat:     Pharynx: Uvula midline. No oropharyngeal exudate.  Eyes:     Conjunctiva/sclera: Conjunctivae normal.  Neck:     Thyroid: No thyromegaly.     Trachea: Trachea normal. No tracheal tenderness or tracheal deviation.  Cardiovascular:     Rate and Rhythm: Normal rate and regular rhythm.     Heart sounds: Normal heart sounds, S1 normal and S2 normal. No murmur heard. Pulmonary:     Effort: No respiratory distress.     Breath sounds: Normal breath sounds. No stridor. No wheezing or rales.  Lymphadenopathy:     Head:  Right side of head: No tonsillar adenopathy.     Left side of head: No tonsillar adenopathy.     Cervical: No cervical adenopathy.  Skin:    Findings: No erythema or rash.     Nails: There is no clubbing.  Neurological:     Mental Status: She is alert.    Diagnostics:   Review of a high-resolution chest CT scan obtained 01 September 2020 identifies the following:  Cardiovascular: Left anterior descending coronary artery calcification. Heart size within normal limits. No pericardial effusion.   Mediastinum/Nodes: No pathologically enlarged mediastinal or axillary lymph nodes. Hilar regions are difficult to definitively evaluate without IV contrast but appear grossly unremarkable. Esophagus is grossly unremarkable.   Lungs/Pleura: Mild biapical pleuroparenchymal scarring. Negative for subpleural reticulation, traction bronchiectasis/bronchiolectasis, ground-glass, architectural distortion or honeycombing. Minimal dependent subpleural atelectasis. Calcified granulomas. Minimal lingular scarring. No pleural fluid. Airway is unremarkable. There is air trapping.  Results of an echocardiogram obtained 13 August 2020 identifies the following:   1. Left  ventricular ejection fraction, by estimation, is 60 to 65%. The  left ventricle has normal function. The left ventricle has no regional  wall motion abnormalities. Left ventricular diastolic parameters were  normal.   2. Right ventricular systolic function is normal. The right ventricular  size is normal. Tricuspid regurgitation signal is inadequate for assessing  PA pressure.   3. The mitral valve is normal in structure. Trivial mitral valve  regurgitation. No evidence of mitral stenosis.   4. The aortic valve is tricuspid. Aortic valve regurgitation is not  visualized. No aortic stenosis is present.   5. The inferior vena cava is normal in size with greater than 50%  respiratory variability, suggesting right atrial pressure of 3 mmHg.   Assessment and Plan:   1. Perennial allergic rhinitis   2. Allergic conjunctivitis of both eyes   3. Pruritic disorder   4. LPRD (laryngopharyngeal reflux disease)   5. Positive sm/RNP antibody   6. Sicca syndrome ([facility])   7. Undifferentiated connective tissue disease ([place])     1.  Continue to perform Allergen avoidance measures as best as possible  2. Continue to Treat and prevent inflammation:   A.  Flonase 1-2 sprays each nostril daily  4.  Continue to Treat and prevent reflux / LPR:   A. Omeprazole 40 mg - 1 tablet in AM  B. Famotidine 40 mg - 1 tablet in PM  5. If needed:   A.  Nasal saline  B.  Cetirizine 10 mg - 1 time per day  C.  Auvi-Q 0.3, Benadryl, MD/ER evaluation for allergic reaction  D.  Systane eye drops multiple times per day  E.  OTC Pataday - 1 drop each eye 1 time per day  6. Avoid systemic steroid as much as possible. Can try increasing cetirizine and famotidine to 2 times per day during steroid use which may help skin flushing.   7. Return to clinic in 6 months or earlier if problem  8.  Obtain bone density scan  [name] has a big problem and this problem revolves around the fact that she has some form of  undifferentiated connective tissue disease with polyarthralgia and sicca syndrome and positive RNP antibody and has been intolerant of using some of the less worrisome immunosuppressive agents available to treat this condition.  It should be noted that the expression of her autoimmune disease occurs in the context of using systemic steroids on almost a monthly basis for various articular issues  and for drug reactions.  It is quite possible that her connective tissue disease would be much worse without these systemic steroids.  I did make the recommendation that she avoid all systemic steroids at this point in time and we will see what happens with her connective tissue disease as she moves forward.  If she expresses more activity of this condition then she would qualify for a biologic agent to address this issue whether it be a anti-TNF biologic agent or an anti-CD 20 biologic agent.  In the long run I think that form of therapy would be much safer than having her receive systemic steroids as often as she does for these joint issues.  And, she is starting to get some significant side effects from the use of her systemic steroids.  She fortunately has an eye exam coming up soon and an examination for cataract formation can be completed at that point and she probably needs a bone scan to look for possible osteoporosis given her history of systemic steroid use at some point in the future.  I will see her back in this clinic in 6 months or earlier if there is a problem.  Andrea Katz, MD [facility]ergy / [facility] [place]

## 2020-11-10 NOTE — Patient Instructions (Addendum)
  1.  Continue to perform Allergen avoidance measures as best as possible  2. Continue to Treat and prevent inflammation:   A.  Flonase 1-2 sprays each nostril daily  4.  Continue to Treat and prevent reflux / LPR:   A. Omeprazole 40 mg - 1 tablet in AM  B. Famotidine 40 mg - 1 tablet in PM  5. If needed:   A.  Nasal saline  B.  Cetirizine 10 mg - 1 time per day  C.  Auvi-Q 0.3, Benadryl, MD/ER evaluation for allergic reaction  D.  Systane eye drops multiple times per day  E.  OTC Pataday - 1 drop each eye 1 time per day  6. Avoid systemic steroid as much as possible. Can try increasing cetirizine and famotidine to 2 times per day during steroid use which may help skin flushing.   7. Return to clinic in 6 months or earlier if problem  8.  Obtain bone density scan

## 2020-11-11 ENCOUNTER — Telehealth: Payer: Self-pay | Admitting: *Deleted

## 2020-11-11 ENCOUNTER — Encounter: Payer: Self-pay | Admitting: Allergy and Immunology

## 2020-11-11 NOTE — Telephone Encounter (Signed)
Order has been placed with [facility]. Called the patient and informed of the order and provided her with the phone number so that she can call and schedule for a time that works for her.

## 2020-11-11 NOTE — Addendum Note (Signed)
Addended by: Chip Boer R on: 11/11/2020 12:17 PM   Modules accepted: Orders

## 2020-11-11 NOTE — Telephone Encounter (Signed)
-----   Message from Jiles Prows, MD sent at 11/11/2020  7:26 AM EDT ----- Please arrange for [facility] to receive a bone density scan for osteoporosis screening

## 2020-11-13 ENCOUNTER — Other Ambulatory Visit: Payer: Self-pay

## 2020-11-13 ENCOUNTER — Ambulatory Visit (HOSPITAL_COMMUNITY)
Admission: RE | Admit: 2020-11-13 | Discharge: 2020-11-13 | Disposition: A | Discharge status: Home / Self Care | Payer: BC Managed Care – PPO | Source: Ambulatory Visit | Attending: Internal Medicine | Admitting: Internal Medicine

## 2020-11-13 VITALS — BP 118/80 | HR 92 | Wt 181.2 lb

## 2020-11-13 DIAGNOSIS — I2510 Atherosclerotic heart disease of native coronary artery without angina pectoris: Secondary | ICD-10-CM | POA: Diagnosis not present

## 2020-11-13 DIAGNOSIS — Z880 Allergy status to penicillin: Secondary | ICD-10-CM | POA: Diagnosis not present

## 2020-11-13 DIAGNOSIS — Z882 Allergy status to sulfonamides status: Secondary | ICD-10-CM | POA: Diagnosis not present

## 2020-11-13 DIAGNOSIS — M351 Other overlap syndromes: Secondary | ICD-10-CM | POA: Insufficient documentation

## 2020-11-13 DIAGNOSIS — I272 Pulmonary hypertension, unspecified: Secondary | ICD-10-CM | POA: Diagnosis not present

## 2020-11-13 DIAGNOSIS — Z8249 Family history of ischemic heart disease and other diseases of the circulatory system: Secondary | ICD-10-CM | POA: Diagnosis not present

## 2020-11-13 DIAGNOSIS — Z881 Allergy status to other antibiotic agents status: Secondary | ICD-10-CM | POA: Insufficient documentation

## 2020-11-13 LAB — COMPREHENSIVE METABOLIC PANEL
ALT: 78 U/L — ABNORMAL HIGH (ref 0–44)
AST: 64 U/L — ABNORMAL HIGH (ref 15–41)
Albumin: 4 g/dL (ref 3.5–5.0)
Alkaline Phosphatase: 133 U/L — ABNORMAL HIGH (ref 38–126)
Anion gap: 8 (ref 5–15)
BUN: 6 mg/dL (ref 6–20)
CO2: 27 mmol/L (ref 22–32)
Calcium: 9.2 mg/dL (ref 8.9–10.3)
Chloride: 103 mmol/L (ref 98–111)
Creatinine, Ser: 0.78 mg/dL (ref 0.44–1.00)
GFR, Estimated: >60 mL/min (ref >60)
Glucose, Bld: 88 mg/dL (ref 70–99)
Potassium: 3.9 mmol/L (ref 3.5–5.1)
Sodium: 138 mmol/L (ref 135–145)
Total Bilirubin: 0.7 mg/dL (ref 0.3–1.2)
Total Protein: 6.9 g/dL (ref 6.5–8.1)

## 2020-11-13 LAB — LIPID PANEL
Cholesterol: 230 mg/dL — ABNORMAL HIGH (ref 0–200)
HDL: 66 mg/dL (ref >40)
LDL Cholesterol: 125 mg/dL — ABNORMAL HIGH (ref 0–99)
Total CHOL/HDL Ratio: 3.5 RATIO
Triglycerides: 194 mg/dL — ABNORMAL HIGH (ref <150)
VLDL: 39 mg/dL (ref 0–40)

## 2020-11-13 MED ORDER — ROSUVASTATIN CALCIUM 10 MG PO TABS: 10.0000 mg | ORAL_TABLET | Freq: Every day | ORAL | 3 refills | Status: DC

## 2020-11-13 NOTE — Addendum Note (Signed)
Encounter addended by: Scarlette Calico, RN on: 11/13/2020 4:30 PM  Actions taken: Order list changed, Diagnosis association updated

## 2020-11-13 NOTE — Addendum Note (Signed)
Encounter addended by: Scarlette Calico, RN on: 11/13/2020 4:28 PM  Actions taken: Diagnosis association updated, Order list changed, Pharmacy for encounter modified, Clinical Note Signed, Charge Capture section accepted

## 2020-11-13 NOTE — Patient Instructions (Signed)
Start Crestor 10 mg Daily  Labs done today, your results will be available in MyChart, we will contact you for abnormal readings.  Your provider has recommended you have a Coronary Calcium Score CT done, this is done at [facility] on [address], they will call you to schedule  Your physician recommends that you schedule a follow-up appointment in: 6 months with an echocardiogram (April 2023), **PLEASE CALL OUR OFFICE IN February TO SCHEDULE THESE APPOINTMENTS  If you have any questions or concerns before your next appointment please send [place] a message through [place] or call our office at [phone].    TO LEAVE A MESSAGE FOR THE NURSE SELECT OPTION 2, PLEASE LEAVE A MESSAGE INCLUDING: YOUR NAME DATE OF BIRTH CALL BACK NUMBER REASON FOR CALL**this is important as we prioritize the call backs  YOU WILL RECEIVE A CALL BACK THE SAME DAY AS LONG AS YOU CALL BEFORE 4:00 PM  At the [facility], you and your health needs are our priority. As part of our continuing mission to provide you with exceptional heart care, we have created designated Provider Care Teams. These Care Teams include your primary Cardiologist (physician) and Advanced Practice Providers (APPs- Physician Assistants and Nurse Practitioners) who all work together to provide you with the care you need, when you need it.   You may see any of the following providers on your designated Care Team at your next follow up: Dr Glori Bickers Dr Loralie Champagne Dr Patrice Paradise, NP Lyda Jester, [place] Ginnie Smart Audry Riles, PharmD   Please be sure to bring in all your medications bottles to every appointment.

## 2020-11-13 NOTE — Progress Notes (Signed)
ADVANCED HF CLINIC CONSULT NOTE  Referring Physician: Dr. Arlean Hopping Primary Care: Andrea Few, MD Primary Cardiologist: New  HPI:  [name] is a 50 y/o woman with GERD, hypothyroidism and mixed connective tissue disorder referred by Dr. Estanislado May for screening for pulmonary HTN.   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 When we saw her earlier this year, DLCO was down and she had f/u Hi-Res CT in 7/22. CT showed no ILD. + Mild calcification of LAD. Referred back for further evaluation.   Denies CP or SOB. Continues to struggle with 3 trigger fingers and tendonitis in L elbow. Having more fatigue but remains very active at school. Walks 8000-10,000 steps per day  Echo 08/13/20  EF 60-65% RV ok. No evidence PAH PFTs 07/14/20: FEV1 2.6 (86%) FVC 3.2 (83%) DLCO 73%     Past Medical History:  Diagnosis Date   Allergy    Anxiety    Complication of anesthesia    " wakes up  crying"   Depression    Eczema    GERD (gastroesophageal reflux disease)    Hypothyroidism    Interstitial cystitis    Ligament tear    Left elbow   Menorrhagia    Migraines    PONV (postoperative nausea and vomiting)    likes phenergan and scopolamine patch   Tennis elbow    Tennis elbow    Thyroid disease    Trigger finger of right hand    Urticaria     Current Outpatient Medications  Medication Sig Dispense Refill   cetirizine (ZYRTEC) 10 MG tablet Take 10 mg by mouth 2 (two) times daily.     EPINEPHrine (AUVI-Q) 0.3 mg/0.3 mL IJ SOAJ injection Use as directed for severe allergic reaction 2 each 2   famotidine (PEPCID) 40 MG tablet TAKE 1 TABLET(40 MG) BY MOUTH EVERY EVENING 90 tablet 2   fluticasone (FLONASE) 50 MCG/ACT nasal spray SHAKE LIQUID AND USE 1 TO 2 SPRAYS IN EACH NOSTRIL EVERY DAY AS DIRECTED 16 g 5   levothyroxine (SYNTHROID, LEVOTHROID) 100 MCG tablet Take 1 tablet (100 mcg total) by mouth daily. 90 tablet 3   LORazepam (ATIVAN) 0.5 MG tablet Take 1-2 tablets (0.5-1 mg total)  by mouth at bedtime as needed for anxiety. 30 tablet 2   naproxen sodium (ALEVE) 220 MG tablet Take 220 mg by mouth as needed.     nitrofurantoin, macrocrystal-monohydrate, (MACROBID) 100 MG capsule Take 100 mg by mouth as needed.     omeprazole (PRILOSEC) 40 MG capsule Take 1 capsule (40 mg total) by mouth daily. 90 capsule 2   No current facility-administered medications for this encounter.    Allergies  Allergen Reactions   Augmentin [Amoxicillin-Pot Clavulanate] Swelling    Pt has tolerated amoxicillin prior Black rings around eyes   Imuran [Azathioprine]     Muscle aches, joint aches, fatigue, GI upset, itching, rash    Other     Other reaction(s): Chest Pain   Penicillins     Other reaction(s): Eye Swelling, Headache   Sulfa Antibiotics Shortness Of Breath    Other reaction(s): Unknown   Vibramycin [Doxycycline]     Other reaction(s): Edema, Edema, Unknown   Doxycycline Calcium Swelling    Retained fluid   Latex Rash   Plaquenil [Hydroxychloroquine] Rash      Social History   Socioeconomic History   Marital status: Significant Other    Spouse name: Not on file   Number of children: Not on file  Years of education: Not on file   Highest education level: Not on file  Occupational History   Not on file  Tobacco Use   Smoking status: Never   Smokeless tobacco: Never  Vaping Use   Vaping Use: Never used  Substance and Sexual Activity   Alcohol use: Yes    Alcohol/week: 2.0 standard drinks    Types: 2 Glasses of wine per week    Comment: a May times weekly   Drug use: No   Sexual activity: Not on file  Other Topics Concern   Not on file  Social History Narrative   Not on file   Social Determinants of Health   Financial Resource Strain: Not on file  Food Insecurity: Not on file  Transportation Needs: Not on file  Physical Activity: Not on file  Stress: Not on file  Social Connections: Not on file  Intimate Partner Violence: Not on file      Family  History  Problem Relation Age of Onset   Thyroid disease Mother    Hypertension Father    Hyperlipidemia Father    Cancer Maternal Grandmother    Diabetes Maternal Grandmother    Hypertension Maternal Grandmother    Heart disease Maternal Grandfather    Hyperlipidemia Maternal Grandfather    Hypertension Maternal Grandfather    Diabetes Paternal Grandmother    Stroke Paternal Grandmother    Diabetes Paternal Grandfather    Heart disease Paternal Grandfather    Hyperlipidemia Paternal Grandfather    Hypertension Paternal Grandfather     Vitals:   11/13/20 1522  BP: 118/80  Pulse: 92  SpO2: 97%  Weight: 82.2 kg (181 lb 3.2 oz)    PHYSICAL EXAM: General:  Well appearing. No resp difficulty HEENT: normal Neck: supple. no JVD. Carotids 2+ bilat; no bruits. No lymphadenopathy or thryomegaly appreciated. Cor: PMI nondisplaced. Regular rate & rhythm. No rubs, gallops or murmurs. Lungs: clear Abdomen: soft, nontender, nondistended. No hepatosplenomegaly. No bruits or masses. Good bowel sounds. Extremities: no cyanosis, clubbing, rash, edema Neuro: alert & orientedx3, cranial nerves grossly intact. moves all 4 extremities w/o difficulty. Affect pleasant   ASSESSMENT & PLAN:   1.  Coronary calcification on CT - CT scan reviewed. Dense calcium in prox LAD. No other sites noted - no s/s angina - with CTD at high-risk for early vascular disease  - will get dedicated cardiac calcium score to quantify  - check lipid panel - will start Crestor 10 - repeat calcium score in 1 year - if develops symptoms will need cardiac CT angio  2. PAH screening in mixed connective tissue disorder - We discussed incidence or PAH and ILD in patients with MTCD (~30%) - Echo 08/13/20  EF 60-65% RV ok. No evidence PAH - PFTs 07/14/20: FEV1 2.6 (86%) FVC 3.2 (83%) DLCO 73% - No evidence of PAH on echo  - CXR and PFTs suggest possible mild interstitial lung disease. Hi-res CT 7/22 no ILD. Mild  calcification of LAD - Continue yearly echos  Glori Bickers, MD  4:00 PM

## 2020-11-18 ENCOUNTER — Ambulatory Visit: Payer: BC Managed Care – PPO | Admitting: Physician Assistant

## 2020-11-19 ENCOUNTER — Encounter: Payer: Self-pay | Admitting: Physician Assistant

## 2020-11-19 ENCOUNTER — Ambulatory Visit: Payer: BC Managed Care – PPO | Admitting: Physician Assistant

## 2020-11-19 ENCOUNTER — Other Ambulatory Visit: Payer: Self-pay

## 2020-11-19 VITALS — BP 120/72 | HR 82 | Ht 66.5 in | Wt 181.2 lb

## 2020-11-19 DIAGNOSIS — Z840 Family history of diseases of the skin and subcutaneous tissue: Secondary | ICD-10-CM

## 2020-11-19 DIAGNOSIS — R5383 Other fatigue: Secondary | ICD-10-CM

## 2020-11-19 DIAGNOSIS — E039 Hypothyroidism, unspecified: Secondary | ICD-10-CM

## 2020-11-19 DIAGNOSIS — M7918 Myalgia, other site: Secondary | ICD-10-CM

## 2020-11-19 DIAGNOSIS — M7712 Lateral epicondylitis, left elbow: Secondary | ICD-10-CM | POA: Diagnosis not present

## 2020-11-19 DIAGNOSIS — Z79899 Other long term (current) drug therapy: Secondary | ICD-10-CM

## 2020-11-19 DIAGNOSIS — F411 Generalized anxiety disorder: Secondary | ICD-10-CM

## 2020-11-19 DIAGNOSIS — R682 Dry mouth, unspecified: Secondary | ICD-10-CM | POA: Diagnosis not present

## 2020-11-19 DIAGNOSIS — M359 Systemic involvement of connective tissue, unspecified: Secondary | ICD-10-CM

## 2020-11-19 DIAGNOSIS — Z8261 Family history of arthritis: Secondary | ICD-10-CM

## 2020-11-19 DIAGNOSIS — K9041 Non-celiac gluten sensitivity: Secondary | ICD-10-CM

## 2020-11-19 DIAGNOSIS — M7061 Trochanteric bursitis, right hip: Secondary | ICD-10-CM

## 2020-11-19 DIAGNOSIS — M19042 Primary osteoarthritis, left hand: Secondary | ICD-10-CM

## 2020-11-19 DIAGNOSIS — M19041 Primary osteoarthritis, right hand: Secondary | ICD-10-CM

## 2020-11-19 DIAGNOSIS — M65331 Trigger finger, right middle finger: Secondary | ICD-10-CM

## 2020-11-19 DIAGNOSIS — M7062 Trochanteric bursitis, left hip: Secondary | ICD-10-CM

## 2020-11-19 DIAGNOSIS — M65341 Trigger finger, right ring finger: Secondary | ICD-10-CM

## 2020-11-19 DIAGNOSIS — E7800 Pure hypercholesterolemia, unspecified: Secondary | ICD-10-CM

## 2020-11-19 DIAGNOSIS — L500 Allergic urticaria: Secondary | ICD-10-CM

## 2020-11-19 NOTE — Patient Instructions (Signed)
Hip Bursitis Rehab Ask your health care provider which exercises are safe for you. Do exercises exactly as told by your health care provider and adjust them as directed. It is normal to feel mild stretching, pulling, tightness, or discomfort as you do these exercises. Stop right away if you feel sudden pain or your pain gets worse. Do not begin these exercises until told by your health care provider. Stretching exercise This exercise warms up your muscles and joints and improves the movement and flexibility of your hip. This exercise also helps to relieve pain and stiffness. Iliotibial band stretch An iliotibial band is a strong band of muscle tissue that runs from the outer side of your hip to the outer side of your thigh and knee. Lie on your side with your left / right leg in the top position. Bend your left / right knee and grab your ankle. Stretch out your bottom arm to help you balance. Slowly bring your knee back so your thigh is behind your body. Slowly lower your knee toward the floor until you feel a gentle stretch on the outside of your left / right thigh. If you do not feel a stretch and your knee will not fall farther, place the heel of your other foot on top of your knee and pull your knee down toward the floor with your foot. Hold this position for __________ seconds. Slowly return to the starting position. Repeat __________ times. Complete this exercise __________ times a day. Strengthening exercises These exercises build strength and endurance in your hip and pelvis. Endurance is the ability to use your muscles for a long time, even after they get tired. Bridge This exercise strengthens the muscles that move your thigh backward (hip extensors). Lie on your back on a firm surface with your knees bent and your feet flat on the floor. Tighten your buttocks muscles and lift your buttocks off the floor until your trunk is level with your thighs. Do not arch your back. You should feel  the muscles working in your buttocks and the back of your thighs. If you do not feel these muscles, slide your feet 1-2 inches (2.5-5 cm) farther away from your buttocks. If this exercise is too easy, try doing it with your arms crossed over your chest. Hold this position for __________ seconds. Slowly lower your hips to the starting position. Let your muscles relax completely after each repetition. Repeat __________ times. Complete this exercise __________ times a day. Squats This exercise strengthens the muscles in front of your thigh and knee (quadriceps). Stand in front of a table, with your feet and knees pointing straight ahead. You may rest your hands on the table for balance but not for support. Slowly bend your knees and lower your hips like you are going to sit in a chair. Keep your weight over your heels, not over your toes. Keep your lower legs upright so they are parallel with the table legs. Do not let your hips go lower than your knees. Do not bend lower than told by your health care provider. If your hip pain increases, do not bend as low. Hold the squat position for __________ seconds. Slowly push with your legs to return to standing. Do not use your hands to pull yourself to standing. Repeat __________ times. Complete this exercise __________ times a day. Hip hike Stand sideways on a bottom step. Stand on your left / right leg with your other foot unsupported next to the step. You can hold  on to the railing or wall for balance if needed. Keep your knees straight and your torso square. Then lift your left / right hip up toward the ceiling. Hold this position for __________ seconds. Slowly let your left / right hip lower toward the floor, past the starting position. Your foot should get closer to the floor. Do not lean or bend your knees. Repeat __________ times. Complete this exercise __________ times a day. Single leg stand Without shoes, stand near a railing or in a  doorway. You may hold on to the railing or door frame as needed for balance. Squeeze your left / right buttock muscles, then lift up your other foot. Do not let your left / right hip push out to the side. It is helpful to stand in front of a mirror for this exercise so you can watch your hip. Hold this position for __________ seconds. Repeat __________ times. Complete this exercise __________ times a day. This information is not intended to replace advice given to you by your health care provider. Make sure you discuss any questions you have with your health care provider. Document Revised: 05/21/2018 Document Reviewed: 05/21/2018 Elsevier Patient Education  [place] Band Syndrome Rehab Ask your health care provider which exercises are safe for you. Do exercises exactly as told by your health care provider and adjust them as directed. It is normal to feel mild stretching, pulling, tightness, or discomfort as you do these exercises. Stop right away if you feel sudden pain or your pain gets significantly worse. Do not begin these exercises until told by your health care provider. Stretching and range-of-motion exercises These exercises warm up your muscles and joints and improve the movement and flexibility of your hip and pelvis. Quadriceps stretch, prone  Lie on your abdomen (prone position) on a firm surface, such as a bed or padded floor. Bend your left / right knee and reach back to hold your ankle or pant leg. If you cannot reach your ankle or pant leg, loop a belt around your foot and grab the belt instead. Gently pull your heel toward your buttocks. Your knee should not slide out to the side. You should feel a stretch in the front of your thigh and knee (quadriceps). Hold this position for __________ seconds. Repeat __________ times. Complete this exercise __________ times a day. Iliotibial band stretch An iliotibial band is a strong band of muscle tissue that  runs from the outer side of your hip to the outer side of your thigh and knee. Lie on your side with your left / right leg in the top position. Bend both of your knees and grab your left / right ankle. Stretch out your bottom arm to help you balance. Slowly bring your top knee back so your thigh goes behind your trunk. Slowly lower your top leg toward the floor until you feel a gentle stretch on the outside of your left / right hip and thigh. If you do not feel a stretch and your knee will not fall farther, place the heel of your other foot on top of your knee and pull your knee down toward the floor with your foot. Hold this position for __________ seconds. Repeat __________ times. Complete this exercise __________ times a day. Strengthening exercises These exercises build strength and endurance in your hip and pelvis. Endurance is the ability to use your muscles for a long time, even after they get tired. Straight leg raises, side-lying This exercise  strengthens the muscles that rotate the leg at the hip and move it away from your body (hip abductors). Lie on your side with your left / right leg in the top position. Lie so your head, shoulder, hip, and knee line up. You may bend your bottom knee to help you balance. Roll your hips slightly forward so your hips are stacked directly over each other and your left / right knee is facing forward. Tense the muscles in your outer thigh and lift your top leg 4-6 inches (10-15 cm). Hold this position for __________ seconds. Slowly lower your leg to return to the starting position. Let your muscles relax completely before doing another repetition. Repeat __________ times. Complete this exercise __________ times a day. Leg raises, prone This exercise strengthens the muscles that move the hips backward (hip extensors). Lie on your abdomen (prone position) on your bed or a firm surface. You can put a pillow under your hips if that is more comfortable for  your lower back. Bend your left / right knee so your foot is straight up in the air. Squeeze your buttocks muscles and lift your left / right thigh off the bed. Do not let your back arch. Tense your thigh muscle as hard as you can without increasing any knee pain. Hold this position for __________ seconds. Slowly lower your leg to return to the starting position and allow it to relax completely. Repeat __________ times. Complete this exercise __________ times a day. Hip hike Stand sideways on a bottom step. Stand on your left / right leg with your other foot unsupported next to the step. You can hold on to a railing or wall for balance if needed. Keep your knees straight and your torso square. Then lift your left / right hip up toward the ceiling. Slowly let your left / right hip lower toward the floor, past the starting position. Your foot should get closer to the floor. Do not lean or bend your knees. Repeat __________ times. Complete this exercise __________ times a day. This information is not intended to replace advice given to you by your health care provider. Make sure you discuss any questions you have with your health care provider. Document Revised: 04/03/2019 Document Reviewed: 04/03/2019 Elsevier Patient Education  [place].

## 2020-12-09 ENCOUNTER — Ambulatory Visit (INDEPENDENT_AMBULATORY_CARE_PROVIDER_SITE_OTHER)
Admission: RE | Admit: 2020-12-09 | Discharge: 2020-12-09 | Disposition: A | Discharge status: Home / Self Care | Payer: Self-pay | Source: Ambulatory Visit | Attending: Internal Medicine | Admitting: Internal Medicine

## 2020-12-09 ENCOUNTER — Other Ambulatory Visit: Payer: Self-pay

## 2020-12-09 DIAGNOSIS — I2510 Atherosclerotic heart disease of native coronary artery without angina pectoris: Secondary | ICD-10-CM

## 2020-12-21 DIAGNOSIS — M79641 Pain in right hand: Secondary | ICD-10-CM | POA: Insufficient documentation

## 2020-12-29 ENCOUNTER — Other Ambulatory Visit: Payer: Self-pay

## 2020-12-29 ENCOUNTER — Ambulatory Visit: Payer: BC Managed Care – PPO | Admitting: Internal Medicine

## 2020-12-29 ENCOUNTER — Encounter: Payer: Self-pay | Admitting: Internal Medicine

## 2020-12-29 VITALS — BP 122/78 | HR 89 | Resp 18 | Ht 66.5 in | Wt 179.8 lb

## 2020-12-29 DIAGNOSIS — M351 Other overlap syndromes: Secondary | ICD-10-CM

## 2020-12-29 DIAGNOSIS — E7800 Pure hypercholesterolemia, unspecified: Secondary | ICD-10-CM

## 2020-12-29 DIAGNOSIS — F411 Generalized anxiety disorder: Secondary | ICD-10-CM

## 2020-12-29 DIAGNOSIS — E039 Hypothyroidism, unspecified: Secondary | ICD-10-CM

## 2020-12-29 DIAGNOSIS — M19042 Primary osteoarthritis, left hand: Secondary | ICD-10-CM

## 2020-12-29 DIAGNOSIS — Z1211 Encounter for screening for malignant neoplasm of colon: Secondary | ICD-10-CM

## 2020-12-29 DIAGNOSIS — M19041 Primary osteoarthritis, right hand: Secondary | ICD-10-CM

## 2020-12-29 DIAGNOSIS — R21 Rash and other nonspecific skin eruption: Secondary | ICD-10-CM

## 2020-12-29 NOTE — Patient Instructions (Addendum)
We will get you in for a screening colonoscopy.

## 2020-12-29 NOTE — Progress Notes (Signed)
   Subjective:   Patient ID: [name], female    DOB: 1970-03-12, 50 y.o.   MRN: [id number]  HPI The patient is a 50 YO new female coming in for ongoing care.   PMH, [facility], social history reviewed and updated  Review of Systems  Constitutional: Negative.   HENT: Negative.    Eyes: Negative.   Respiratory:  Negative for cough, chest tightness and shortness of breath.   Cardiovascular:  Negative for chest pain, palpitations and leg swelling.  Gastrointestinal:  Negative for abdominal distention, abdominal pain, constipation, diarrhea, nausea and vomiting.  Musculoskeletal:  Positive for arthralgias and myalgias.  Skin: Negative.   Neurological: Negative.   Psychiatric/Behavioral: Negative.     Objective:  Physical Exam Constitutional:      Appearance: She is well-developed.  HENT:     Head: Normocephalic and atraumatic.  Cardiovascular:     Rate and Rhythm: Normal rate and regular rhythm.  Pulmonary:     Effort: Pulmonary effort is normal. No respiratory distress.     Breath sounds: Normal breath sounds. No wheezing or rales.  Abdominal:     General: Bowel sounds are normal. There is no distension.     Palpations: Abdomen is soft.     Tenderness: There is no abdominal tenderness. There is no rebound.  Musculoskeletal:        General: Tenderness present.     Cervical back: Normal range of motion.  Skin:    General: Skin is warm and dry.  Neurological:     Mental Status: She is alert and oriented to person, place, and time.     Coordination: Coordination normal.    Vitals:   12/29/20 1518  BP: 122/78  Pulse: 89  Resp: 18  SpO2: 98%  Weight: 179 lb 12.8 oz (81.6 kg)  Height: 5' 6.5" (1.689 m)    This visit occurred during the SARS-CoV-2 public health emergency.  Safety protocols were in place, including screening questions prior to the visit, additional usage of staff PPE, and extensive cleaning of exam room while observing appropriate contact time as  indicated for disinfecting solutions.   Assessment & Plan:  Visit time 40 minutes in face to face communication with patient and coordination of care, additional 20 minutes spent in record review, coordination or care, ordering tests, communicating/referring to other healthcare professionals, documenting in medical records all on the same day of the visit for total time 60 minutes spent on the visit.

## 2020-12-30 NOTE — Assessment & Plan Note (Signed)
Seeing rheumatology and they are still doing ongoing testing to help clarify her specific auto-immune disease.

## 2020-12-30 NOTE — Assessment & Plan Note (Signed)
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 Seeing orthopedics and they are doing injections to help and she has considered surgery. Taking aleve as needed with pepcid.

## 2020-12-30 NOTE — Assessment & Plan Note (Signed)
Reviewed recent labs with her and she is taking crestor 10 mg daily due to calcium score which showed 92 percentile LAD calcifications and known family hx heart disease. She is tolerating this well and will plan repeat in 6 months.

## 2020-12-30 NOTE — Assessment & Plan Note (Signed)
Likely due to health conditions and uses lorazepam prn and other coping techniques. Does not need daily medication at this time.

## 2020-12-30 NOTE — Assessment & Plan Note (Signed)
Has had a lot of allergy kind of reactions with allergens and medications.

## 2020-12-30 NOTE — Assessment & Plan Note (Signed)
Reviewed recent labs and appropriate. Taking synthroid 100 mcg daily which we can prescribe if needed.

## 2021-02-04 ENCOUNTER — Encounter: Payer: Self-pay | Admitting: Gastroenterology

## 2021-02-04 ENCOUNTER — Telehealth: Payer: Self-pay | Admitting: Allergy and Immunology

## 2021-02-04 NOTE — Telephone Encounter (Signed)
Called patient and informed her about increasing famotidine to twice a day and can add OTC Tums

## 2021-02-04 NOTE — Telephone Encounter (Signed)
Please inform Elenna that she can increase her famotidine to twice a day and she can add an OTC Tums if needed.  Let us know how that works.

## 2021-02-04 NOTE — Telephone Encounter (Signed)
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 Her provider Hazel Sams, PA-C with Rheumatology) thought the only thing that could induce that was the omeprazole, because of this provider took patient off of the omeprazole. This was late September/ early October.   Patient states she is really struggling & her acid reflux is very bad. Patient had to take one dose over the holidays due to how bad it is. Patient is still taking famotadine.   Patient has follow up labs in January to see if taking her off the omeprazole makes any difference.   Patient is wanting to know if anything else could be sent in to help ease her heartburn. [place], [place] [id number]  Patient is requesting a call back, best contact number: [phone]

## 2021-02-23 ENCOUNTER — Encounter: Payer: Self-pay | Admitting: Rheumatology

## 2021-02-24 ENCOUNTER — Ambulatory Visit: Payer: BC Managed Care – PPO | Admitting: Rheumatology

## 2021-02-24 ENCOUNTER — Telehealth: Payer: Self-pay

## 2021-02-24 NOTE — Telephone Encounter (Signed)
Received fax from [place] that states "unacceptable transport box condition. Invalid transport condition: no cold pack"  I called Arrie Aran, the [facility] representative and he states that he just shipped ASAP labs more cold packs and they are aware that the labs have to be shipped with a cold pack. The patient can have these labs drawn again at no cost to her. I have prepared a new order and it is at the front desk to pick up.   I attempted to contact patient and left message on machine to advise patient to call the office.

## 2021-02-25 ENCOUNTER — Telehealth: Payer: Self-pay | Admitting: Rheumatology

## 2021-02-25 NOTE — Telephone Encounter (Signed)
Patient left a voicemail to return [name]'s call regarding her labs. Patient states that from the voicemail she received she needs to have her labs retaken due to some transportation issue with the lab. 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

## 2021-02-25 NOTE — Telephone Encounter (Signed)
See previous phone note.  

## 2021-02-25 NOTE — Telephone Encounter (Signed)
Patient advised and she will come by the office to pick up order form.

## 2021-03-11 ENCOUNTER — Encounter: Payer: Self-pay | Admitting: Rheumatology

## 2021-03-19 ENCOUNTER — Telehealth: Payer: Self-pay

## 2021-03-19 NOTE — Telephone Encounter (Signed)
Patient called stating Dr. Estanislado Pandy told her to discontinue Omeprazole until her labwork results came in.  Patient states she is experiencing a lot of heart burn, as well as reflux and the Pepcid is not helping.  Patient states if her labwork shows that the medication is not a factor in her lupus she would like to start taking it again.

## 2021-03-22 NOTE — Telephone Encounter (Signed)
AVISE labs show only positive ANA.  She should be able to resume omeprazole.  We will discuss results at the follow-up visit.

## 2021-03-22 NOTE — Telephone Encounter (Signed)
Patient advised AVISE labs show only positive ANA.  She should be able to resume omeprazole.  We will discuss results at the follow-up visit.

## 2021-03-30 NOTE — Progress Notes (Signed)
Office Visit Note  Patient: [name]             Date of Birth: 17-Nov-1970           MRN: [id number]             PCP: Hoyt Koch, MD Referring: Brien Few, MD Visit Date: 04/13/2021 Occupation: @GUAROCC @  Subjective:  Pain in right hand  History of Present Illness: Karmina Zufall is a 51 y.o. female with history of sicca symptoms, osteoarthritis and myofascial pain.  She states she continues to have dry mouth.  She had right third and fourth trigger finger release in October 2022 by Dr. Apolonio Schneiders.  She states she developed a scarring in her hand from that.  She has been going to occupational therapy without much relief.  She states she has a stiffness in her right second and third digits.  She states that her right third and fourth fingers to stay swollen.  None of the other joints are painful.  She denies any history of oral ulcers, nasal ulcers, malar rash, photosensitivity, Raynaud's phenomenon, lymphadenopathy.  Activities of Daily Living:  Patient reports morning stiffness for several hours.   Patient Reports nocturnal pain.  Difficulty dressing/grooming: Denies Difficulty climbing stairs: Denies Difficulty getting out of chair: Denies Difficulty using hands for taps, buttons, cutlery, and/or writing: Reports  Review of Systems  Constitutional:  Positive for fatigue.  HENT:  Positive for mouth dryness. Negative for mouth sores and nose dryness.   Eyes:  Negative for pain, itching and dryness.  Respiratory:  Negative for shortness of breath and difficulty breathing.   Cardiovascular:  Negative for chest pain and palpitations.  Gastrointestinal:  Positive for diarrhea. Negative for blood in stool and constipation.  Endocrine: Negative for increased urination.  Genitourinary:  Negative for difficulty urinating.  Musculoskeletal:  Positive for joint pain, joint pain, joint swelling, myalgias, morning stiffness, muscle tenderness and myalgias.  Skin:   Negative for color change, rash, redness and sensitivity to sunlight.  Allergic/Immunologic: Positive for susceptible to infections.  Neurological:  Positive for numbness. Negative for dizziness, headaches, memory loss and weakness.  Hematological:  Positive for bruising/bleeding tendency.  Psychiatric/Behavioral:  Negative for confusion.    PMFS History:  Patient Active Problem List   Diagnosis Date Noted   Mixed connective tissue disease ([place]) 10/02/2020   Primary osteoarthritis of both hands 10/02/2020   Rash and other nonspecific skin eruption 05/29/2020   Generalized anxiety disorder 02/02/2018   Allergic urticaria 02/02/2018   Acquired hypothyroidism 02/02/2018   Elevated cholesterol 02/02/2018    Past Medical History:  Diagnosis Date   Allergy    Anxiety    Complication of anesthesia    " wakes up  crying"   Depression    Eczema    GERD (gastroesophageal reflux disease)    Hypothyroidism    Interstitial cystitis    Ligament tear    Left elbow   Menorrhagia    Migraines    PONV (postoperative nausea and vomiting)    likes phenergan and scopolamine patch   Tennis elbow    Tennis elbow    Thyroid disease    Trigger finger of right hand    Urticaria     Family History  Problem Relation Age of Onset   Thyroid disease Mother    Hypertension Father    Hyperlipidemia Father    Cancer Maternal Grandmother    Diabetes Maternal Grandmother    Hypertension Maternal Grandmother  Heart disease Maternal Grandfather    Hyperlipidemia Maternal Grandfather    Hypertension Maternal Grandfather    Diabetes Paternal Grandmother    Stroke Paternal Grandmother    Diabetes Paternal Grandfather    Heart disease Paternal Grandfather    Hyperlipidemia Paternal Grandfather    Hypertension Paternal Grandfather    Past Surgical History:  Procedure Laterality Date   carpal tunnel release Bilateral 2010   tennis elbow also done   CESAREAN SECTION     x 2   CHOLECYSTECTOMY   2003   DIAGNOSTIC LAPAROSCOPY  20002   thru belly button to check for endometriosis   DILITATION & CURRETTAGE/HYSTROSCOPY WITH NOVASURE ABLATION N/A 11/18/2019   Procedure: DILATATION & CURETTAGE/HYSTEROSCOPY WITH NOVASURE ABLATION;  Surgeon: Brien Few, MD;  Location: [facility];  Service: Gynecology;  Laterality: N/A;   ELBOW SURGERY     TRIGGER FINGER RELEASE Right    3rd and 4th digit   Social History   Social History Narrative   Not on file   Immunization History  Administered Date(s) Administered   Influenza,inj,Quad PF,6+ Mos 11/17/2015, 11/18/2016, 12/08/2017, 12/14/2018   Influenza-Unspecified 12/08/2017, 12/11/2020   Moderna Sars-Covid-2 Vaccination 02/04/2020   PFIZER Comirnaty(Gray Top)Covid-19 Tri-Sucrose Vaccine 04/12/2019, 05/03/2019     Objective: Vital Signs: BP 111/70 (BP Location: Left Arm, Patient Position: Sitting, Cuff Size: Normal)    Pulse 77    Ht 5\' 7"  (1.702 m)    Wt 186 lb (84.4 kg)    BMI 29.13 kg/m    Physical Exam Vitals and nursing note reviewed.  Constitutional:      Appearance: She is well-developed.  HENT:     Head: Normocephalic and atraumatic.  Eyes:     Conjunctiva/sclera: Conjunctivae normal.  Cardiovascular:     Rate and Rhythm: Normal rate and regular rhythm.     Heart sounds: Normal heart sounds.  Pulmonary:     Effort: Pulmonary effort is normal.     Breath sounds: Normal breath sounds.  Abdominal:     General: Bowel sounds are normal.     Palpations: Abdomen is soft.  Musculoskeletal:     Cervical back: Normal range of motion.  Lymphadenopathy:     Cervical: No cervical adenopathy.  Skin:    General: Skin is warm and dry.     Capillary Refill: Capillary refill takes less than 2 seconds.  Neurological:     Mental Status: She is alert and oriented to person, place, and time.  Psychiatric:        Behavior: Behavior normal.     Musculoskeletal Exam: C-spine was in good range of motion.  Shoulder  joints, elbow joints, wrist joints, MCPs PIPs and DIPs with good range of motion with no synovitis.  She had difficulty with right hand fist formation due to thickening of her right second and third flexor tendon.  No synovitis was noted over MCPs or PIP joints.  Hip joints and knee joints with good range of motion.  She has some tenderness over bilateral trochanteric bursa.  She had no tenderness over ankles or MTPs.  CDAI Exam: CDAI Score: -- Patient Global: --; Provider Global: -- Swollen: --; Tender: -- Joint Exam 04/13/2021   No joint exam has been documented for this visit   There is currently no information documented on the homunculus. Go to the Rheumatology activity and complete the homunculus joint exam.  Investigation: No additional findings.  Imaging: No results found.  Recent Labs: Lab Results  Component Value Date  WBC 8.3 08/05/2020   HGB 13.7 08/05/2020   PLT 334 08/05/2020   NA 138 11/13/2020   K 3.9 11/13/2020   CL 103 11/13/2020   CO2 27 11/13/2020   GLUCOSE 88 11/13/2020   BUN 6 11/13/2020   CREATININE 0.78 11/13/2020   BILITOT 0.7 11/13/2020   ALKPHOS 133 (H) 11/13/2020   AST 64 (H) 11/13/2020   ALT 78 (H) 11/13/2020   PROT 6.9 11/13/2020   ALBUMIN 4.0 11/13/2020   CALCIUM 9.2 11/13/2020   GFRAA 114 08/05/2020   QFTBGOLDPLUS NEGATIVE 05/04/2020    Speciality Comments: PLQ EYE EXAM 06/23/2020 WNL PLQ- , rash, sob,  Imuran-headache, abdominal pain,flu like symptoms 09/02/20 HRCT negative for ILD,08/13/20 ECHO cardio    Procedures:  No procedures performed Allergies: Augmentin [amoxicillin-pot clavulanate], Imuran [azathioprine], Other, Penicillins, Sulfa antibiotics, Vibramycin [doxycycline], Doxycycline calcium, Latex, and Plaquenil [hydroxychloroquine]   Assessment / Plan:     Visit Diagnoses: Undifferentiated connective tissue disease ([place]) - Positive ANA, RNP negative, dry mouth, and joint pain. MRI neck - bilateral parotid changes.  She  gives history of intermittent rash.  Recent labs were reviewed which were unremarkable.  March 11, 2018 AVISE lupus index -1.0, ANA 1: 80 speckled, ENA negative, Jo 1 negative, CB CAP Negative, anticardiolipin negative, beta-2 GP 1 negative, antiphosphatidylserine negative, C1q negative, antihistone negative, RF negative, anti-CCP negative, anti-Car P negative, antithyroglobulin positive, anti-TPO positive.  She initially had positive RNP which became negative and has been persistently negative.  Her ANA titer is low positive and not significant.  Patient questions MRI results and concerned about change in antibiotic meds.  She will be going to [facility] for a second opinion.  She was evaluated by Dr. Vaughan Browner in the past due to shortness of breath.  Her work-up was negative for ILD.  Her cardiology work-up by Dr. Haroldine Laws was negative.  High risk medication use - D/c PLQ-rash, SOB, Imuran-HA, abdominal pain. She does not require immunosuppressive therapy at this time.  Dry mouth-she continues to have dry mouth.  Over-the-counter products were discussed.  Lateral epicondylitis, left elbow-she is off-and-on discomfort.  Forearm exercises were discussed.  Trigger middle finger of right hand - Surgical release scheduled on 12/03/20 with Dr. Caralyn Guile.  She has a scarring from the surgical release and has stiffness.  No synovitis was noted.  Trigger ring finger of right hand - Surgical release scheduled on 12/03/20.    Primary osteoarthritis of both hands-she has osteoarthritic changes in her hands with PIP and DIP thickening.  No synovitis was noted.  Trochanteric bursitis of both hips-stretching exercises were discussed.  Other fatigue-she continues to have chronic fatigue.  Myofascial pain-she continues to have generalized pain and discomfort.  Stretching exercises were emphasized.  Good sleep hygiene was discussed.  NCGS (non-celiac gluten sensitivity)  Generalized anxiety disorder  Allergic  urticaria - Followed by allergist.  Elevated cholesterol  Family history of lupus erythematosus  Family history of rheumatoid arthritis  Other specified hypothyroidism  Hypothyroidism due to Hashimoto's thyroiditis -she has anti-TPO and antithyroglobulin antibodies.  I will refer her to endocrinology per her request.  Plan: Ambulatory referral to Endocrinology  Orders: Orders Placed This Encounter  Procedures   Ambulatory referral to Endocrinology   No orders of the defined types were placed in this encounter.  Face-to-face time spent with patient was over 30 minutes.  More than 50% time was spent in counseling and coordination of care.  Follow-Up Instructions: Return in about 6 months (around 10/14/2021) for UCTD, Osteoarthritis.  Bo Merino, MD  Note - This record has been created using Editor, commissioning.  Chart creation errors have been sought, but may not always  have been located. Such creation errors do not reflect on  the standard of medical care.

## 2021-04-01 ENCOUNTER — Ambulatory Visit: Payer: BC Managed Care – PPO | Admitting: Rheumatology

## 2021-04-05 ENCOUNTER — Encounter: Payer: BC Managed Care – PPO | Admitting: Gastroenterology

## 2021-04-13 ENCOUNTER — Other Ambulatory Visit: Payer: Self-pay

## 2021-04-13 ENCOUNTER — Encounter: Payer: Self-pay | Admitting: Rheumatology

## 2021-04-13 ENCOUNTER — Ambulatory Visit: Payer: BC Managed Care – PPO | Admitting: Rheumatology

## 2021-04-13 VITALS — BP 111/70 | HR 77 | Ht 67.0 in | Wt 186.0 lb

## 2021-04-13 DIAGNOSIS — Z8261 Family history of arthritis: Secondary | ICD-10-CM

## 2021-04-13 DIAGNOSIS — M359 Systemic involvement of connective tissue, unspecified: Secondary | ICD-10-CM | POA: Diagnosis not present

## 2021-04-13 DIAGNOSIS — M7712 Lateral epicondylitis, left elbow: Secondary | ICD-10-CM | POA: Diagnosis not present

## 2021-04-13 DIAGNOSIS — R682 Dry mouth, unspecified: Secondary | ICD-10-CM | POA: Diagnosis not present

## 2021-04-13 DIAGNOSIS — E038 Other specified hypothyroidism: Secondary | ICD-10-CM

## 2021-04-13 DIAGNOSIS — M7061 Trochanteric bursitis, right hip: Secondary | ICD-10-CM

## 2021-04-13 DIAGNOSIS — Z79899 Other long term (current) drug therapy: Secondary | ICD-10-CM

## 2021-04-13 DIAGNOSIS — R5383 Other fatigue: Secondary | ICD-10-CM

## 2021-04-13 DIAGNOSIS — M65341 Trigger finger, right ring finger: Secondary | ICD-10-CM

## 2021-04-13 DIAGNOSIS — E7800 Pure hypercholesterolemia, unspecified: Secondary | ICD-10-CM

## 2021-04-13 DIAGNOSIS — Z840 Family history of diseases of the skin and subcutaneous tissue: Secondary | ICD-10-CM

## 2021-04-13 DIAGNOSIS — M7062 Trochanteric bursitis, left hip: Secondary | ICD-10-CM

## 2021-04-13 DIAGNOSIS — M7918 Myalgia, other site: Secondary | ICD-10-CM

## 2021-04-13 DIAGNOSIS — E063 Autoimmune thyroiditis: Secondary | ICD-10-CM

## 2021-04-13 DIAGNOSIS — K9041 Non-celiac gluten sensitivity: Secondary | ICD-10-CM

## 2021-04-13 DIAGNOSIS — M65331 Trigger finger, right middle finger: Secondary | ICD-10-CM

## 2021-04-13 DIAGNOSIS — M19041 Primary osteoarthritis, right hand: Secondary | ICD-10-CM

## 2021-04-13 DIAGNOSIS — E039 Hypothyroidism, unspecified: Secondary | ICD-10-CM

## 2021-04-13 DIAGNOSIS — F411 Generalized anxiety disorder: Secondary | ICD-10-CM

## 2021-04-13 DIAGNOSIS — M19042 Primary osteoarthritis, left hand: Secondary | ICD-10-CM

## 2021-04-13 DIAGNOSIS — L500 Allergic urticaria: Secondary | ICD-10-CM

## 2021-05-05 ENCOUNTER — Ambulatory Visit
Admission: RE | Admit: 2021-05-05 | Discharge: 2021-05-05 | Disposition: A | Discharge status: Home / Self Care | Payer: BC Managed Care – PPO | Source: Ambulatory Visit | Attending: Allergy and Immunology | Admitting: Allergy and Immunology

## 2021-05-11 ENCOUNTER — Other Ambulatory Visit: Payer: Self-pay | Admitting: *Deleted

## 2021-05-11 ENCOUNTER — Telehealth: Payer: Self-pay | Admitting: *Deleted

## 2021-05-11 MED ORDER — PREDNISONE 10 MG PO TABS: 10.0000 mg | ORAL_TABLET | Freq: Every day | ORAL | 0 refills | Status: AC

## 2021-05-11 MED ORDER — AZITHROMYCIN 500 MG PO TABS
500.0000 mg | ORAL_TABLET | Freq: Every day | ORAL | 0 refills | Status: AC
Start: 2021-05-11 — End: 2021-05-14

## 2021-05-11 NOTE — Telephone Encounter (Signed)
Patient called and stated that she has had yellow mucous production for the last 2-3 weeks and a lot of tenderness under her eyes and around her nose. She states that she woke up this morning around 4am and her teeth were hurting and she is completely miserable. She was wondering if something could be sent in for her until she sees you for her follow up next week.  ?

## 2021-05-11 NOTE — Telephone Encounter (Signed)
Prescriptions have been sent in to pharmacy. Called patient and advised, patient verbalized understanding.  ?

## 2021-05-11 NOTE — Telephone Encounter (Signed)
Please let Myriah know that we can send in azithromycin 500 mg - 1 tablet 1 time per day for 3 days plus prednisone 10 mg - 1 tablet 1 time per day for 3 days. ?

## 2021-05-18 ENCOUNTER — Encounter: Payer: Self-pay | Admitting: Allergy and Immunology

## 2021-05-18 ENCOUNTER — Ambulatory Visit: Payer: BC Managed Care – PPO | Admitting: Allergy and Immunology

## 2021-05-18 VITALS — BP 126/84 | HR 90 | Temp 97.8°F | Resp 18 | Ht 67.0 in | Wt 182.8 lb

## 2021-05-18 DIAGNOSIS — K219 Gastro-esophageal reflux disease without esophagitis: Secondary | ICD-10-CM

## 2021-05-18 DIAGNOSIS — J3089 Other allergic rhinitis: Secondary | ICD-10-CM

## 2021-05-18 DIAGNOSIS — T63481A Toxic effect of venom of other arthropod, accidental (unintentional), initial encounter: Secondary | ICD-10-CM

## 2021-05-18 NOTE — Patient Instructions (Addendum)
?  1.  Continue to perform Allergen avoidance measures as best as possible - dog ? ?2. Continue to Treat and prevent inflammation: ? ? A.  Flonase 1-2 sprays each nostril daily ? ?4.  Continue to Treat and prevent reflux / LPR: ? ? A. Omeprazole 40 mg - 1 tablet in AM ? B. Famotidine 40 mg - 1 tablet in PM ? ?5. If needed: ? ? A.  Nasal saline ? B.  Cetirizine 10 mg - 1 time per day ? C.  Auvi-Q 0.3, Benadryl, MD/ER evaluation for allergic reaction ? D.  Systane eye drops multiple times per day ? E.  OTC Pataday - 1 drop each eye 1 time per day ? ?6. Return to clinic in 6 months or earlier if problem ? ?  ? ?

## 2021-05-18 NOTE — Progress Notes (Signed)
? ?[facility] ? ? ?Follow-up Note ? ?Referring Provider: Brien Few, MD ?Primary Provider: Hoyt Koch, MD ?Date of Office Visit: 05/18/2021 ? ?Subjective:  ? ?[name] (DOB: [birth year]) is a 51 y.o. female who returns to the [facility] on 05/18/2021 in re-evaluation of the following: ? 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  I last saw her in this clinic on 10 November 2020. ? ?She contracted COVID 23 April 2021 most likely from her place of employment which is an [employer] and she became quite sick with yellow nasal discharge and coughing.  She never really completely cleared that issue and approximately 1 week ago she developed acute right maxillary pain and maxilla pain and pressure in her face and feeling bad for which she contacted me and I gave her 3 days of azithromycin and 3 days of low-dose prednisone at 10 mg/day and she is a lot better at this point in time.  She might still have a little bit of pressure affecting her right maxillary region but she no longer has any yellow nasal discharge and overall feels better. ? ?Other than that event she has not had a systemic steroid since I have seen her in this clinic.  In the past she was receiving systemic steroids quite commonly from different subspecialties.  She did have a DEXA scan performed which did not identify any significant amount of osteoporosis. ? ?She did have a positive RNP antibody and it was felt that she might have had a mixed connective tissue disease but she was evaluated by [facility] rheumatology who felt that her problem was mostly fibromyalgia and she is not on any immunosuppressive drug for a connective tissue disease at this point in time. ? ?She has not been stung  by any hymenoptera. ? ?Allergies as of 05/18/2021   ? ?   Reactions  ? Augmentin [amoxicillin-pot Clavulanate] Swelling  ? Pt has tolerated amoxicillin prior ?Black rings around eyes  ? Imuran [azathioprine]   ? Muscle aches, joint aches, fatigue, GI upset, itching, rash   ? Other   ? Other reaction(s): Chest Pain  ? Penicillins   ? Other reaction(s): Eye Swelling, Headache  ? Sulfa Antibiotics Shortness Of Breath  ? Other reaction(s): Unknown  ? Vibramycin [doxycycline]   ? Other reaction(s): Edema, Edema, Unknown  ? Doxycycline Calcium Swelling  ? Retained fluid  ? Latex Rash  ? Plaquenil [hydroxychloroquine] Rash  ? ?  ? ?  ?Medication List  ? ? ?cetirizine 10 MG tablet ?Commonly known as: ZYRTEC ?Take 10 mg by mouth 2 (two) times daily. ?  ?EPINEPHrine 0.3 mg/0.3 mL Soaj injection ?Commonly known as: Auvi-Q ?Use as directed for severe allergic reaction ?  ?famotidine 40 MG tablet ?Commonly known as: PEPCID ?TAKE 1 TABLET(40 MG) BY MOUTH EVERY EVENING ?  ?fluticasone 50 MCG/ACT nasal spray ?Commonly known as: FLONASE ?SHAKE LIQUID AND USE 1 TO 2 SPRAYS IN EACH NOSTRIL EVERY DAY AS DIRECTED ?  ?levothyroxine 100 MCG tablet ?Commonly known as: SYNTHROID ?Take 1 tablet (100 mcg total) by mouth daily. ?  ?LORazepam 0.5 MG tablet ?Commonly known as: ATIVAN ?Take 1-2 tablets (0.5-1 mg total) by mouth at bedtime as needed for anxiety. ?  ?naproxen sodium 220 MG tablet ?Commonly known as: ALEVE ?Take 220  mg by mouth as needed. ?  ?omeprazole 40 MG capsule ?Commonly known as: PRILOSEC ?Take 1 capsule (40 mg total) by mouth daily. ?  ?rosuvastatin 10 MG tablet ?Commonly known as: CRESTOR ?Take 1 tablet (10 mg total) by mouth daily. ?  ? ?Past Medical History:  ?Diagnosis Date  ? Allergy   ? Anxiety   ? Complication of anesthesia   ? " wakes up  crying"  ? Depression   ? Eczema   ? GERD (gastroesophageal reflux disease)   ? Hypothyroidism   ? Interstitial cystitis   ? Ligament tear   ? Left elbow  ? Menorrhagia   ? Migraines    ? PONV (postoperative nausea and vomiting)   ? likes phenergan and scopolamine patch  ? Tennis elbow   ? Tennis elbow   ? Thyroid disease   ? Trigger finger of right hand   ? Urticaria   ? ? ?Past Surgical History:  ?Procedure Laterality Date  ? carpal tunnel release Bilateral 2010  ? tennis elbow also done  ? CESAREAN SECTION    ? x 2  ? CHOLECYSTECTOMY  2003  ? DIAGNOSTIC LAPAROSCOPY  20002  ? thru belly button to check for endometriosis  ? DILITATION & CURRETTAGE/HYSTROSCOPY WITH NOVASURE ABLATION N/A 11/18/2019  ? Procedure: DILATATION & CURETTAGE/HYSTEROSCOPY WITH NOVASURE ABLATION;  Surgeon: Brien Few, MD;  Location: [facility];  Service: Gynecology;  Laterality: N/A;  ? ELBOW SURGERY    ? TRIGGER FINGER RELEASE Right   ? 3rd and 4th digit  ? ? ?Review of systems negative except as noted in HPI / PMHx or noted below: ? ?Review of Systems  ?Constitutional: Negative.   ?HENT: Negative.    ?Eyes: Negative.   ?Respiratory: Negative.    ?Cardiovascular: Negative.   ?Gastrointestinal: Negative.   ?Genitourinary: Negative.   ?Musculoskeletal: Negative.   ?Skin: Negative.   ?Neurological: Negative.   ?Endo/Heme/Allergies: Negative.   ?Psychiatric/Behavioral: Negative.    ? ? ?Objective:  ? ?Vitals:  ? 05/18/21 1608  ?BP: 126/84  ?Pulse: 90  ?Resp: 18  ?Temp: 97.8 ?F (36.6 ?C)  ?SpO2: 98%  ? ?Height: '5\' 7"'$  (170.2 cm)  ?Weight: 182 lb 12.8 oz (82.9 kg)  ? ?Physical Exam ?Constitutional:   ?   Appearance: She is not diaphoretic.  ?HENT:  ?   Head: Normocephalic.  ?   Right Ear: Tympanic membrane, ear canal and external ear normal.  ?   Left Ear: Tympanic membrane, ear canal and external ear normal.  ?   Nose: Nose normal. No mucosal edema or rhinorrhea.  ?   Mouth/Throat:  ?   Pharynx: Uvula midline. No oropharyngeal exudate.  ?Eyes:  ?   Conjunctiva/sclera: Conjunctivae normal.  ?Neck:  ?   Thyroid: No thyromegaly.  ?   Trachea: Trachea normal. No tracheal tenderness or tracheal deviation.   ?Cardiovascular:  ?   Rate and Rhythm: Normal rate and regular rhythm.  ?   Heart sounds: Normal heart sounds, S1 normal and S2 normal. No murmur heard. ?Pulmonary:  ?   Effort: No respiratory distress.  ?   Breath sounds: Normal breath sounds. No stridor. No wheezing or rales.  ?Lymphadenopathy:  ?   Head:  ?   Right side of head: No tonsillar adenopathy.  ?   Left side of head: No tonsillar adenopathy.  ?   Cervical: No cervical adenopathy.  ?Skin: ?   Findings: No erythema or rash.  ?   Nails: There is  no clubbing.  ?Neurological:  ?   Mental Status: She is alert.  ? ? ?Diagnostics:  ? ?Results of a DEXA scan obtained 05 May 2021 identified the following: ? ?The BMD measured at Femur Neck Right is 0.906 g/cm2 with a T-score ?of -1.0. This patient is considered normal according to [employer] ?Organization (WHO) criteria. ?  ?The quality of the exam is good. The lumbar spine was excluded due ?to degenerative changes. ?  ?Site Region Measured Date Measured Age YA BMD Significant CHANGE ?T-score ?DualFemur Neck Right 05/05/2021 52.2 -1.0 0.906 g/cm2 ?  ?DualFemur Total Mean 05/05/2021 52.2 -0.4 0.959 g/cm2 ?  ?Left Forearm Radius 33% 05/05/2021 52.2 -0.5 0.832 g/cm2 ? ?Assessment and Plan:  ? ?1. Perennial allergic rhinitis   ?2. LPRD (laryngopharyngeal reflux disease)   ?3. 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 ?1.  Continue to perform Allergen avoidance measures as best as possible - dog ? ?2. Continue to Treat and prevent inflammation: ? ? A.  Flonase 1-2 sprays each nostril daily ? ?4.  Continue to Treat and prevent reflux / LPR: ? ? A. Omeprazole 40 mg - 1 tablet in AM ? B. Famotidine 40 mg - 1 tablet in PM ? ?5. If needed: ? ? A.  Nasal saline ? B.  Cetirizine 10 mg - 1 time per day ? C.  Auvi-Q 0.3, Benadryl, MD/ER evaluation for allergic reaction ? D.  Systane eye drops multiple times per day ? E.  OTC Pataday - 1 drop each eye 1 time per day ? ?6. Return to clinic in 6 months or earlier if  problem ? ?At this point Everlynn appears to be doing relatively well.  She just went through an interval of time following COVID with some persistent respiratory tract symptoms and may have had a lingering second

## 2021-05-19 ENCOUNTER — Encounter: Payer: Self-pay | Admitting: Gastroenterology

## 2021-05-19 ENCOUNTER — Encounter: Payer: Self-pay | Admitting: Allergy and Immunology

## 2021-05-21 ENCOUNTER — Telehealth: Payer: Self-pay | Admitting: Rheumatology

## 2021-05-21 NOTE — Telephone Encounter (Signed)
Patient called the office stating that at her last appointment on 04/13/21 Dr. Estanislado Pandy mentioned a referral to endocrinology. Patient states that she hasn't heard from our office regarding that referral nor has any endocrinology office reached out to her. Patient requests an update.  ?

## 2021-05-21 NOTE — Telephone Encounter (Signed)
Spoke with patient and advised the referral was placed to endocrinology on 04/13/2021. Patient provided number so she may contact their office to schedule.  ?

## 2021-06-09 ENCOUNTER — Telehealth: Payer: Self-pay | Admitting: Allergy and Immunology

## 2021-06-09 MED ORDER — FLUTICASONE PROPIONATE 50 MCG/ACT NA SUSP: 1.0000 | Freq: Every day | NASAL | 5 refills | Status: DC

## 2021-06-09 MED ORDER — FAMOTIDINE 40 MG PO TABS
ORAL_TABLET | ORAL | 1 refills | Status: DC
Start: 2021-06-09 — End: 2021-12-14

## 2021-06-09 MED ORDER — CETIRIZINE HCL 10 MG PO TABS: 10.0000 mg | ORAL_TABLET | Freq: Every day | ORAL | 5 refills | Status: DC

## 2021-06-09 MED ORDER — OMEPRAZOLE 40 MG PO CPDR: DELAYED_RELEASE_CAPSULE | ORAL | 1 refills | Status: DC

## 2021-06-09 NOTE — Telephone Encounter (Signed)
[name] called in and states she never received refills from her OV. She would like refills on Flonase, Pepcid, Zyrtec, and Omeprazole.  She would like those called in to [facility] on [address] in [place].  Please advise.  ?

## 2021-06-09 NOTE — Telephone Encounter (Signed)
I called the patient and she has been notified that the Flonase, Pepcid, Zyrtec, and Omeprazole has been sent into the [facility] on [address] in [place]. ? ? ? ?Patient received a 90 day supply of zyrtec last month from her PCP. I did inform her the zyrtec and flonase looks like it's not covered by her insurance due to it being and OTC medication.  ?

## 2021-07-06 ENCOUNTER — Ambulatory Visit (AMBULATORY_SURGERY_CENTER): Payer: BC Managed Care – PPO | Admitting: *Deleted

## 2021-07-06 VITALS — Ht 67.0 in | Wt 183.0 lb

## 2021-07-06 DIAGNOSIS — Z1211 Encounter for screening for malignant neoplasm of colon: Secondary | ICD-10-CM

## 2021-07-06 MED ORDER — CLENPIQ 10-3.5-12 MG-GM -GM/160ML PO SOLN: 1.0000 | ORAL | 0 refills | Status: DC

## 2021-07-06 NOTE — Progress Notes (Signed)
No egg or soy allergy known to patient  No issues known to pt with past sedation with any surgeries or procedures Patient denies ever being told they had issues or difficulty with intubation  No FH of Malignant Hyperthermia Pt is not on diet pills Pt is not on  home 02  Pt is not on blood thinners  Pt denies issues with constipation, does sometimes have issues with pain meds but takes a probiotic which helps with symptoms. No A fib or A flutter  PV completed over the phone. Pt verified name, DOB, address and insurance during PV today.   Pt encouraged to call with questions or issues.  If pt has My chart, procedure instructions sent via My Chart  Insurance confirmed with pt at [facility] today

## 2021-07-08 ENCOUNTER — Encounter: Payer: Self-pay | Admitting: Internal Medicine

## 2021-07-08 ENCOUNTER — Ambulatory Visit: Payer: BC Managed Care – PPO | Admitting: Internal Medicine

## 2021-07-08 VITALS — BP 114/60 | HR 73 | Temp 98.5°F | Ht 67.0 in | Wt 183.0 lb

## 2021-07-08 DIAGNOSIS — J0101 Acute recurrent maxillary sinusitis: Secondary | ICD-10-CM | POA: Diagnosis not present

## 2021-07-08 DIAGNOSIS — K219 Gastro-esophageal reflux disease without esophagitis: Secondary | ICD-10-CM | POA: Insufficient documentation

## 2021-07-08 DIAGNOSIS — J029 Acute pharyngitis, unspecified: Secondary | ICD-10-CM

## 2021-07-08 DIAGNOSIS — K582 Mixed irritable bowel syndrome: Secondary | ICD-10-CM | POA: Insufficient documentation

## 2021-07-08 DIAGNOSIS — R140 Abdominal distension (gaseous): Secondary | ICD-10-CM | POA: Insufficient documentation

## 2021-07-08 DIAGNOSIS — K625 Hemorrhage of anus and rectum: Secondary | ICD-10-CM | POA: Insufficient documentation

## 2021-07-08 DIAGNOSIS — R7401 Elevation of levels of liver transaminase levels: Secondary | ICD-10-CM | POA: Insufficient documentation

## 2021-07-08 LAB — POC COVID19 BINAXNOW: SARS Coronavirus 2 Ag: NEGATIVE

## 2021-07-08 LAB — POCT RAPID STREP A (OFFICE): Rapid Strep A Screen: NEGATIVE

## 2021-07-08 MED ORDER — LEVOFLOXACIN 500 MG PO TABS: 500.0000 mg | ORAL_TABLET | Freq: Every day | ORAL | 0 refills | Status: AC

## 2021-07-08 NOTE — Patient Instructions (Signed)
Please take all new medication as prescribed - the antibiotic  Please continue all other medications as before, and refills have been done if requested.  Please have the pharmacy call with any other refills you may need.  Please keep your appointments with your specialists as you may have planned   

## 2021-07-08 NOTE — Progress Notes (Addendum)
Patient ID: [name], female   DOB: [birth year], 51 y.o.   MRN: [id number]        Chief Complaint: follow up sinus symptoms       HPI:  [name] is a 51 y.o. female here with c/o recent hx of mar 2023 covid infection complicated by sinusitis requiring po antibiotics, works as Pharmacist, hospital, and more recently may 24 with onset with onset mild right sided maxillary area sinus pain gradually more severe since then becoming more severe with ST and feeling warm may 28 with overt swelling to right face, denies dental pain or swelling, now cant sleep on right side with face on pillow,  also with HA, malise, feeling of congestion, ear fullness at times, and sore all over with fatigue. Not better in the past wk with zyrtec abd flonase.  Several students in the building with hand foot mouth dz but she had no contact.   No specific neck pain, cough, and Pt denies chest pain, increased sob or doe, wheezing, orthopnea, PND, increased LE swelling, palpitations, dizziness or syncope.   Pt denies polydipsia, polyuria, or new focal neuro s/s.    Pt denies recent wt loss, night sweats, loss of appetite, or other constitutional symptoms        Wt Readings from Last 3 Encounters:  07/08/21 183 lb (83 kg)  07/06/21 183 lb (83 kg)  05/18/21 182 lb 12.8 oz (82.9 kg)   BP Readings from Last 3 Encounters:  07/08/21 114/60  05/18/21 126/84  04/13/21 111/70         Past Medical History:  Diagnosis Date   Allergy    Anxiety    Complication of anesthesia    " wakes up  crying"   Depression    Eczema    GERD (gastroesophageal reflux disease)    Hyperlipidemia    Hypothyroidism    Interstitial cystitis    Ligament tear    Left elbow   Menorrhagia    Migraines    Neuromuscular disorder (HCC)    PONV (postoperative nausea and vomiting)    likes phenergan and scopolamine patch   Tennis elbow    Tennis elbow    Thyroid disease    Trigger finger of right hand    Urticaria    Past Surgical  History:  Procedure Laterality Date   carpal tunnel release Bilateral 2010   tennis elbow also done   CESAREAN SECTION     x 2   CHOLECYSTECTOMY  2003   DIAGNOSTIC LAPAROSCOPY  20002   thru belly button to check for endometriosis   DILITATION & CURRETTAGE/HYSTROSCOPY WITH NOVASURE ABLATION N/A 11/18/2019   Procedure: DILATATION & CURETTAGE/HYSTEROSCOPY WITH NOVASURE ABLATION;  Surgeon: Brien Few, MD;  Location: [place];  Service: Gynecology;  Laterality: N/A;   ELBOW SURGERY     TRIGGER FINGER RELEASE Right    3rd and 4th digit    reports that she has never smoked. She has been exposed to tobacco smoke. She has never used smokeless tobacco. She reports current alcohol use of about 2.0 standard drinks per week. She reports that she does not use drugs. family history includes Cancer in her maternal grandmother; Colon polyps in her father; Diabetes in her maternal grandmother, paternal grandfather, and paternal grandmother; Heart disease in her maternal grandfather and paternal grandfather; Hyperlipidemia in her father, maternal grandfather, and paternal grandfather; Hypertension in her father, maternal grandfather, maternal grandmother, and paternal grandfather; Stroke in her paternal grandmother; Thyroid  disease in her mother. Allergies  Allergen Reactions   Augmentin [Amoxicillin-Pot Clavulanate] Swelling    Pt has tolerated amoxicillin prior Black rings around eyes   Imuran [Azathioprine]     Muscle aches, joint aches, fatigue, GI upset, itching, rash    Other     Other reaction(s): Chest Pain   Penicillins     Other reaction(s): Eye Swelling, Headache   Sulfa Antibiotics Shortness Of Breath    Other reaction(s): Unknown   Vibramycin [Doxycycline]     Other reaction(s): Edema, Edema, Unknown   Doxycycline Calcium Swelling    Retained fluid   Latex Rash   Plaquenil [Hydroxychloroquine] Rash   Current Outpatient Medications on File Prior to Visit   Medication Sig Dispense Refill   cetirizine (ZYRTEC) 10 MG tablet Take 10 mg by mouth 2 (two) times daily.     cetirizine (ZYRTEC) 10 MG tablet Take 1 tablet (10 mg total) by mouth daily. 30 tablet 5   EPINEPHrine (AUVI-Q) 0.3 mg/0.3 mL IJ SOAJ injection Use as directed for severe allergic reaction 2 each 2   famotidine (PEPCID) 40 MG tablet TAKE 1 TABLET(40 MG) BY MOUTH EVERY EVENING 90 tablet 1   fluticasone (FLONASE) 50 MCG/ACT nasal spray Place 1-2 sprays into both nostrils daily. 16 g 5   levothyroxine (SYNTHROID, LEVOTHROID) 100 MCG tablet Take 1 tablet (100 mcg total) by mouth daily. 90 tablet 3   naproxen sodium (ALEVE) 220 MG tablet Take 220 mg by mouth as needed.     nystatin-triamcinolone ointment (MYCOLOG) Apply topically.     omeprazole (PRILOSEC) 40 MG capsule Take 1 tablet in morning 90 capsule 1   Sod Picosulfate-Mag Ox-Cit Acd (CLENPIQ) 10-3.5-12 MG-GM -GM/160ML SOLN Take 1 kit by mouth as directed. 320 mL 0   traZODone (DESYREL) 50 MG tablet Take 100 mg by mouth at bedtime as needed.     rosuvastatin (CRESTOR) 10 MG tablet Take 1 tablet (10 mg total) by mouth daily. 90 tablet 3   No current facility-administered medications on file prior to visit.        ROS:  All others reviewed and negative.  Objective        PE:  BP 114/60 (BP Location: Left Arm, Patient Position: Sitting, Cuff Size: Large)   Pulse 73   Temp 98.5 F (36.9 C) (Oral)   Ht _0  (1.702 m)   Wt 183 lb (83 kg)   SpO2 97%   BMI 28.66 kg/m                 Constitutional: Pt appears in NAD               HENT: Head: NCAT.                Right Ear: External ear normal.                 Left Ear: External ear normal. right tm with mild erythema.  Rght Max sinus areas mod tender with mild swelling.  Pharynx with mild erythema, no exudate or tonsillar LA               Eyes: . Pupils are equal, round, and reactive to light. Conjunctivae and EOM are normal               Nose: without d/c or deformity but has  mild right sided submandibular tender LA               Neck: Neck  supple. Gross normal ROM               Cardiovascular: Normal rate and regular rhythm.                 Pulmonary/Chest: Effort normal and breath sounds without rales or wheezing.                               Neurological: Pt is alert. At baseline orientation, motor grossly intact               Skin: Skin is warm. No rashes, no other new lesions, LE edema - none               Psychiatric: Pt behavior is normal without agitation   Micro: none  Cardiac tracings I have personally interpreted today:  none  Pertinent Radiological findings (summarize): none   Lab Results  Component Value Date   WBC 8.3 08/05/2020   HGB 13.7 08/05/2020   HCT 41.4 08/05/2020   PLT 334 08/05/2020   GLUCOSE 88 11/13/2020   CHOL 230 (H) 11/13/2020   TRIG 194 (H) 11/13/2020   HDL 66 11/13/2020   LDLCALC 125 (H) 11/13/2020   ALT 78 (H) 11/13/2020   AST 64 (H) 11/13/2020   NA 138 11/13/2020   K 3.9 11/13/2020   CL 103 11/13/2020   CREATININE 0.78 11/13/2020   BUN 6 11/13/2020   CO2 27 11/13/2020   TSH 1.72 05/04/2020   HGBA1C 5.3 11/17/2015   Rapid Strep A Screen Negative Negative  Negative    SARS Coronavirus 2 Ag Negative Negative    Assessment/Plan:  [name] is a 51 y.o. White or Caucasian [1] female with  has a past medical history of Allergy, Anxiety, Complication of anesthesia, Depression, Eczema, GERD (gastroesophageal reflux disease), Hyperlipidemia, Hypothyroidism, Interstitial cystitis, Ligament tear, Menorrhagia, Migraines, Neuromuscular disorder (HCC), PONV (postoperative nausea and vomiting), Tennis elbow, Tennis elbow, Thyroid disease, Trigger finger of right hand, and Urticaria.  Sinusitis, acute, maxillary With several days worsening now at least mod to severe, for levaquin 500 qd x 10 d, mucinex bid prn, consider ENT and/or CT sinus for persistent or worsening s/s  Followup: Return if symptoms worsen or  fail to improve.  Cathlean Cower, MD 07/11/2021 9:14 AM [facility]

## 2021-07-11 ENCOUNTER — Encounter: Payer: Self-pay | Admitting: Internal Medicine

## 2021-07-11 DIAGNOSIS — J0100 Acute maxillary sinusitis, unspecified: Secondary | ICD-10-CM | POA: Insufficient documentation

## 2021-07-11 NOTE — Assessment & Plan Note (Signed)
With several days worsening now at least mod to severe, for levaquin 500 qd x 10 d, mucinex bid prn, consider ENT and/or CT sinus for persistent or worsening s/s

## 2021-07-12 ENCOUNTER — Encounter: Payer: Self-pay | Admitting: Internal Medicine

## 2021-07-12 ENCOUNTER — Telehealth: Payer: Self-pay | Admitting: Internal Medicine

## 2021-07-12 NOTE — Telephone Encounter (Signed)
Patient was prescribed levofloxin by Dr. Jenny Reichmann for a sinus infection - patient states that today she has a red rash on arms and neck - should she stop taking the antibiotic

## 2021-07-12 NOTE — Telephone Encounter (Signed)
Patient states that she looks "pink and sunburned" and itchy. Advised patient to hold on taking medication

## 2021-07-12 NOTE — Telephone Encounter (Signed)
Yes, ok to stop the antibiotic.  At this point, even though she may not have finished the antibx, perhaps we can hold for now on further tx as long as her symptoms have improved and no fever.  Also for benadryl 50 mg every 6 hrs as needed for itch and rash

## 2021-07-14 MED ORDER — AZITHROMYCIN 250 MG PO TABS: ORAL_TABLET | ORAL | 0 refills | Status: AC

## 2021-07-14 NOTE — Telephone Encounter (Signed)
Ok for zpack - done erx 

## 2021-07-14 NOTE — Telephone Encounter (Signed)
Patient states that she still has a great amount of phlegm and does not feel as if sinus infection has completely cleared.

## 2021-07-23 ENCOUNTER — Encounter: Payer: Self-pay | Admitting: Gastroenterology

## 2021-07-27 ENCOUNTER — Encounter: Payer: Self-pay | Admitting: Gastroenterology

## 2021-07-27 ENCOUNTER — Ambulatory Visit (AMBULATORY_SURGERY_CENTER): Payer: BC Managed Care – PPO | Admitting: Gastroenterology

## 2021-07-27 VITALS — BP 102/58 | HR 67 | Temp 98.4°F | Resp 16 | Ht 67.0 in | Wt 183.0 lb

## 2021-07-27 DIAGNOSIS — Z1211 Encounter for screening for malignant neoplasm of colon: Secondary | ICD-10-CM | POA: Diagnosis present

## 2021-07-27 MED ORDER — SODIUM CHLORIDE 0.9 % IV SOLN: 500.0000 mL | INTRAVENOUS | Status: DC

## 2021-07-27 MED ORDER — ONDANSETRON HCL 4 MG PO TABS: 4.0000 mg | ORAL_TABLET | Freq: Three times a day (TID) | ORAL | 0 refills | Status: AC | PRN

## 2021-07-27 NOTE — Progress Notes (Signed)
[facility] History and Physical   Primary Care Physician:  Hoyt Koch, MD   Reason for Procedure:   Colon cancer screening  Plan:    Screening colonoscopy     HPI: [name] is a 51 y.o. female undergoing initial average risk screening colonoscopy.  She has no family history of colon cancer.  She has diarrhea-predominant IBS mostly controlled with diet.    Past Medical History:  Diagnosis Date   Allergy    Anxiety    Complication of anesthesia    " wakes up  crying"   Depression    Eczema    GERD (gastroesophageal reflux disease)    Hyperlipidemia    Hypothyroidism    Interstitial cystitis    Ligament tear    Left elbow   Menorrhagia    Migraines    Neuromuscular disorder (HCC)    PONV (postoperative nausea and vomiting)    likes phenergan and scopolamine patch   Tennis elbow    Tennis elbow    Thyroid disease    Trigger finger of right hand    Urticaria     Past Surgical History:  Procedure Laterality Date   carpal tunnel release Bilateral 2010   tennis elbow also done   CESAREAN SECTION     x 2   CHOLECYSTECTOMY  2003   DIAGNOSTIC LAPAROSCOPY  20002   thru belly button to check for endometriosis   DILITATION & CURRETTAGE/HYSTROSCOPY WITH NOVASURE ABLATION N/A 11/18/2019   Procedure: DILATATION & CURETTAGE/HYSTEROSCOPY WITH NOVASURE ABLATION;  Surgeon: Brien Few, MD;  Location: [place];  Service: Gynecology;  Laterality: N/A;   ELBOW SURGERY     TRIGGER FINGER RELEASE Right    3rd and 4th digit    Prior to Admission medications   Medication Sig Start Date End Date Taking? Authorizing Provider  cetirizine (ZYRTEC) 10 MG tablet Take 10 mg by mouth 2 (two) times daily.   Yes [provider]  cetirizine (ZYRTEC) 10 MG tablet Take 1 tablet (10 mg total) by mouth daily. 06/09/21  Yes Kozlow, Donnamarie Poag, MD  famotidine (PEPCID) 40 MG tablet TAKE 1 TABLET(40 MG) BY MOUTH EVERY EVENING 06/09/21  Yes  Kozlow, Donnamarie Poag, MD  fluticasone (FLONASE) 50 MCG/ACT nasal spray Place 1-2 sprays into both nostrils daily. 06/09/21  Yes Kozlow, Donnamarie Poag, MD  levothyroxine (SYNTHROID, LEVOTHROID) 100 MCG tablet Take 1 tablet (100 mcg total) by mouth daily. 02/04/18  Yes Shawnee Knapp, MD  omeprazole (PRILOSEC) 40 MG capsule Take 1 tablet in morning 06/09/21  Yes Kozlow, Donnamarie Poag, MD  rosuvastatin (CRESTOR) 10 MG tablet Take 1 tablet (10 mg total) by mouth daily. 11/13/20 07/27/21 Yes Bensimhon, Shaune Pascal, MD  traZODone (DESYREL) 50 MG tablet Take 100 mg by mouth at bedtime as needed. 06/08/21  Yes [provider]  EPINEPHrine (AUVI-Q) 0.3 mg/0.3 mL IJ SOAJ injection Use as directed for severe allergic reaction 11/10/20   Kozlow, Donnamarie Poag, MD  naproxen sodium (ALEVE) 220 MG tablet Take 220 mg by mouth as needed.    [provider]  nystatin-triamcinolone ointment (MYCOLOG) Apply topically. Patient not taking: Reported on 07/27/2021 05/18/21   [provider]    Current Outpatient Medications  Medication Sig Dispense Refill   cetirizine (ZYRTEC) 10 MG tablet Take 10 mg by mouth 2 (two) times daily.     cetirizine (ZYRTEC) 10 MG tablet Take 1 tablet (10 mg total) by mouth daily. 30 tablet 5   famotidine (  PEPCID) 40 MG tablet TAKE 1 TABLET(40 MG) BY MOUTH EVERY EVENING 90 tablet 1   fluticasone (FLONASE) 50 MCG/ACT nasal spray Place 1-2 sprays into both nostrils daily. 16 g 5   levothyroxine (SYNTHROID, LEVOTHROID) 100 MCG tablet Take 1 tablet (100 mcg total) by mouth daily. 90 tablet 3   omeprazole (PRILOSEC) 40 MG capsule Take 1 tablet in morning 90 capsule 1   rosuvastatin (CRESTOR) 10 MG tablet Take 1 tablet (10 mg total) by mouth daily. 90 tablet 3   traZODone (DESYREL) 50 MG tablet Take 100 mg by mouth at bedtime as needed.     EPINEPHrine (AUVI-Q) 0.3 mg/0.3 mL IJ SOAJ injection Use as directed for severe allergic reaction 2 each 2   naproxen sodium (ALEVE) 220 MG tablet Take 220 mg by mouth as  needed.     nystatin-triamcinolone ointment (MYCOLOG) Apply topically. (Patient not taking: Reported on 07/27/2021)     Current Facility-Administered Medications  Medication Dose Route Frequency Provider Last Rate Last Admin   0.9 %  sodium chloride infusion  500 mL Intravenous Continuous Daryel November, MD        Allergies as of 07/27/2021 - Review Complete 07/27/2021  Allergen Reaction Noted   Augmentin [amoxicillin-pot clavulanate] Swelling 02/02/2018   Imuran [azathioprine]  08/05/2020   Other  05/04/2020   Penicillins  05/04/2020   Sulfa antibiotics Shortness Of Breath 11/17/2015   Vibramycin [doxycycline]  07/26/2016   Doxycycline calcium Swelling 04/03/2013   Latex Rash 02/02/2018   Levaquin [levofloxacin] Rash 07/12/2021   Plaquenil [hydroxychloroquine] Rash 08/24/2020    Family History  Problem Relation Age of Onset   Thyroid disease Mother    Colon polyps Father    Hypertension Father    Hyperlipidemia Father    Cancer Maternal Grandmother    Diabetes Maternal Grandmother    Hypertension Maternal Grandmother    Heart disease Maternal Grandfather    Hyperlipidemia Maternal Grandfather    Hypertension Maternal Grandfather    Diabetes Paternal Grandmother    Stroke Paternal Grandmother    Diabetes Paternal Grandfather    Heart disease Paternal Grandfather    Hyperlipidemia Paternal Grandfather    Hypertension Paternal Grandfather    Colon cancer Neg Hx    Esophageal cancer Neg Hx    Stomach cancer Neg Hx    Rectal cancer Neg Hx     Social History   Socioeconomic History   Marital status: Significant Other    Spouse name: Not on file   Number of children: Not on file   Years of education: Not on file   Highest education level: Not on file  Occupational History   Not on file  Tobacco Use   Smoking status: Never    Passive exposure: Current   Smokeless tobacco: Never  Vaping Use   Vaping Use: Never used  Substance and Sexual Activity   Alcohol  use: Yes    Alcohol/week: 2.0 standard drinks of alcohol    Types: 2 Glasses of wine per week    Comment: a few times weekly   Drug use: No   Sexual activity: Not on file  Other Topics Concern   Not on file  Social History Narrative   Not on file   Social Determinants of Health   Financial Resource Strain: Not on file  Food Insecurity: Not on file  Transportation Needs: Not on file  Physical Activity: Not on file  Stress: Not on file  Social Connections: Not on file  Intimate  Partner Violence: Not on file    Review of Systems:  All other review of systems negative except as mentioned in the HPI.  Physical Exam: Vital signs BP 109/65   Pulse 85   Temp 98.4 F (36.9 C)   Ht '5\' 7"'$  (1.702 m)   Wt 183 lb (83 kg)   SpO2 99%   BMI 28.66 kg/m   General:   Alert,  Well-developed, well-nourished, pleasant and cooperative in NAD Airway:  Mallampati 2 Lungs:  Clear throughout to auscultation.   Heart:  Regular rate and rhythm; no murmurs, clicks, rubs,  or gallops. Abdomen:  Soft, nontender and nondistended. Normal bowel sounds.   Neuro/Psych:  Normal mood and affect. A and O x 3   Sanvi Ehler E. Candis Schatz, MD [facility]

## 2021-07-27 NOTE — Progress Notes (Signed)
PT taken to PACU. Monitors in place. VSS. Report given to RN. 

## 2021-07-27 NOTE — Patient Instructions (Signed)
No repeat colonoscopy for 10 years for screening purposes! Resume previous diet and continue present medications. Pick up Zofran 4 mg from pharmacy to help with Nausea/Vomiting   YOU HAD AN ENDOSCOPIC PROCEDURE TODAY AT [place]:   Refer to the procedure report that was given to you for any specific questions about what was found during the examination.  If the procedure report does not answer your questions, please call your gastroenterologist to clarify.  If you requested that your care partner not be given the details of your procedure findings, then the procedure report has been included in a sealed envelope for you to review at your convenience later.  YOU SHOULD EXPECT: Some feelings of bloating in the abdomen. Passage of more gas than usual.  Walking can help get rid of the air that was put into your GI tract during the procedure and reduce the bloating. If you had a lower endoscopy (such as a colonoscopy or flexible sigmoidoscopy) you may notice spotting of blood in your stool or on the toilet paper. If you underwent a bowel prep for your procedure, you may not have a normal bowel movement for a few days.  Please Note:  You might notice some irritation and congestion in your nose or some drainage.  This is from the oxygen used during your procedure.  There is no need for concern and it should clear up in a day or so.  SYMPTOMS TO REPORT IMMEDIATELY:  Following lower endoscopy (colonoscopy or flexible sigmoidoscopy):  Excessive amounts of blood in the stool  Significant tenderness or worsening of abdominal pains  Swelling of the abdomen that is new, acute  Fever of 100F or higher  For urgent or emergent issues, a gastroenterologist can be reached at any hour by calling [phone]. Do not use MyChart messaging for urgent concerns.    DIET:  We do recommend a small meal at first, but then you may proceed to your regular diet.  Drink plenty of fluids but you should  avoid alcoholic beverages for 24 hours.  ACTIVITY:  You should plan to take it easy for the rest of today and you should NOT DRIVE or use heavy machinery until tomorrow (because of the sedation medicines used during the test).    FOLLOW UP: Our staff will call the number listed on your records 24-72 hours following your procedure to check on you and address any questions or concerns that you may have regarding the information given to you following your procedure. If we do not reach you, we will leave a message.  We will attempt to reach you two times.  During this call, we will ask if you have developed any symptoms of COVID 19. If you develop any symptoms (ie: fever, flu-like symptoms, shortness of breath, cough etc.) before then, please call [phone].  If you test positive for Covid 19 in the 2 weeks post procedure, please call and report this information to [place].    If any biopsies were taken you will be contacted by phone or by letter within the next 1-3 weeks.  Please call us at [phone] if you have not heard about the biopsies in 3 weeks.    SIGNATURES/CONFIDENTIALITY: You and/or your care partner have signed paperwork which will be entered into your electronic medical record.  These signatures attest to the fact that that the information above on your After Visit Summary has been reviewed and is understood.  Full responsibility of the confidentiality of  this discharge information lies with you and/or your care-partner.

## 2021-07-27 NOTE — Op Note (Signed)
[facility] Patient Name: [name] Procedure Date: 07/27/2021 9:23 AM MRN: [id number] Endoscopist: Lorin Picket E. Tomasa Rand , MD Age: 51 Referring MD:  Date of Birth: February 13, 1970 Gender: Female Account #: [id number] Procedure:                Colonoscopy Indications:              Screening for colorectal malignant neoplasm, This                            is the patient's first colonoscopy Medicines:                Monitored Anesthesia Care Procedure:                Pre-Anesthesia Assessment:                           - Prior to the procedure, a History and Physical                            was performed, and patient medications and                            allergies were reviewed. The patient's tolerance of                            previous anesthesia was also reviewed. The risks                            and benefits of the procedure and the sedation                            options and risks were discussed with the patient.                            All questions were answered, and informed consent                            was obtained. Prior Anticoagulants: The patient has                            taken no previous anticoagulant or antiplatelet                            agents. ASA Grade Assessment: II - A patient with                            mild systemic disease. After reviewing the risks                            and benefits, the patient was deemed in                            satisfactory condition to undergo the procedure.  After obtaining informed consent, the colonoscope                            was passed under direct vision. Throughout the                            procedure, the patient's blood pressure, pulse, and                            oxygen saturations were monitored continuously. The                            CF [id number] #[id number] was introduced through the anus                            and advanced to the  the terminal ileum, with                            identification of the appendiceal orifice and IC                            valve. The colonoscopy was performed without                            difficulty. The patient tolerated the procedure                            well. The quality of the bowel preparation was                            good. The terminal ileum, ileocecal valve,                            appendiceal orifice, and rectum were photographed.                            The bowel preparation used was Clenpiq via split                            dose instruction. Scope In: 9:32:39 AM Scope Out: 9:44:30 AM Scope Withdrawal Time: 0 hours 7 minutes 54 seconds  Total Procedure Duration: 0 hours 11 minutes 51 seconds  Findings:                 The perianal and digital rectal examinations were                            normal. Pertinent negatives include normal                            sphincter tone and no palpable rectal lesions.                           The colon (entire examined portion) appeared normal.  The terminal ileum appeared normal.                           The retroflexed view of the distal rectum and anal                            verge was normal and showed no anal or rectal                            abnormalities. Complications:            No immediate complications. Estimated Blood Loss:     Estimated blood loss: none. Impression:               - The entire examined colon is normal.                           - The examined portion of the ileum was normal.                           - The distal rectum and anal verge are normal on                            retroflexion view.                           - No specimens collected. Recommendation:           - Patient has a contact number available for                            emergencies. The signs and symptoms of potential                            delayed complications were  discussed with the                            patient. Return to normal activities tomorrow.                            Written discharge instructions were provided to the                            patient.                           - Resume previous diet.                           - Continue present medications.                           - Repeat colonoscopy in 10 years for screening                            purposes. Tifany Hirsch E. Tomasa Rand, MD 07/27/2021 9:49:01 AM This report has been signed electronically.

## 2021-07-28 ENCOUNTER — Telehealth: Payer: Self-pay | Admitting: *Deleted

## 2021-07-28 NOTE — Telephone Encounter (Signed)
Left message on f/u call 

## 2021-08-24 ENCOUNTER — Encounter (HOSPITAL_COMMUNITY): Payer: Self-pay | Admitting: Internal Medicine

## 2021-08-24 ENCOUNTER — Ambulatory Visit (HOSPITAL_COMMUNITY): Payer: BC Managed Care – PPO

## 2021-08-24 ENCOUNTER — Ambulatory Visit (HOSPITAL_COMMUNITY)
Admission: RE | Admit: 2021-08-24 | Discharge: 2021-08-24 | Disposition: A | Discharge status: Home / Self Care | Payer: BC Managed Care – PPO | Source: Ambulatory Visit | Attending: Internal Medicine | Admitting: Internal Medicine

## 2021-08-24 VITALS — BP 128/80 | HR 74 | Wt 180.8 lb

## 2021-08-24 DIAGNOSIS — E7800 Pure hypercholesterolemia, unspecified: Secondary | ICD-10-CM | POA: Diagnosis not present

## 2021-08-24 DIAGNOSIS — M351 Other overlap syndromes: Secondary | ICD-10-CM | POA: Diagnosis not present

## 2021-08-24 DIAGNOSIS — Z79899 Other long term (current) drug therapy: Secondary | ICD-10-CM | POA: Insufficient documentation

## 2021-08-24 DIAGNOSIS — E039 Hypothyroidism, unspecified: Secondary | ICD-10-CM | POA: Diagnosis not present

## 2021-08-24 DIAGNOSIS — I2510 Atherosclerotic heart disease of native coronary artery without angina pectoris: Secondary | ICD-10-CM | POA: Diagnosis present

## 2021-08-24 DIAGNOSIS — K219 Gastro-esophageal reflux disease without esophagitis: Secondary | ICD-10-CM | POA: Diagnosis not present

## 2021-08-24 DIAGNOSIS — I272 Pulmonary hypertension, unspecified: Secondary | ICD-10-CM | POA: Diagnosis not present

## 2021-08-24 NOTE — Patient Instructions (Signed)
You have been referred to Dr Harrell Gave at [facility], her office will call you to schedule an appointment, do not need to be seen for 6 months

## 2021-08-24 NOTE — Progress Notes (Signed)
ADVANCED HF CLINIC NOTE  Referring Physician: Dr. Arlean Hopping Primary Care: Andrea Koch, MD Primary Cardiologist: New  HPI:  [name] is a 51 y/o woman with GERD, hypothyroidism and mixed connective tissue disorder referred by Dr. Estanislado Pandy for screening for pulmonary HTN.   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 When we saw her earlier this year, DLCO was down and she had f/u Hi-Res CT in 7/22. CT showed no ILD. + Mild calcification of LAD. Referred back for further evaluation.   I saw her in 10/22 for first time.CT scan reviewed. Dense calcium in prox LAD. Started statin. CAC ordered. CAC 24 (LAD only) Echo no signs of PAH.   Has been seen at [facility] and not felt to have MCTD -> felt to have fibromyalgia so she cancelled her echo today. Denies SOB, orthopnea or PND. No CP. + multiple joint/tendon pain   Echo 08/13/20  EF 60-65% RV ok. No evidence PAH PFTs 07/14/20: FEV1 2.6 (86%) FVC 3.2 (83%) DLCO 73%    Past Medical History:  Diagnosis Date   Allergy    Anxiety    Complication of anesthesia    " wakes up  crying"   Depression    Eczema    GERD (gastroesophageal reflux disease)    Hyperlipidemia    Hypothyroidism    Interstitial cystitis    Ligament tear    Left elbow   Menorrhagia    Migraines    Neuromuscular disorder (HCC)    PONV (postoperative nausea and vomiting)    likes phenergan and scopolamine patch   Tennis elbow    Tennis elbow    Thyroid disease    Trigger finger of right hand    Urticaria     Current Outpatient Medications  Medication Sig Dispense Refill   cetirizine (ZYRTEC) 10 MG tablet Take 1 tablet (10 mg total) by mouth daily. 30 tablet 5   EPINEPHrine (AUVI-Q) 0.3 mg/0.3 mL IJ SOAJ injection Use as directed for severe allergic reaction 2 each 2   famotidine (PEPCID) 40 MG tablet TAKE 1 TABLET(40 MG) BY MOUTH EVERY EVENING 90 tablet 1   fluticasone (FLONASE) 50 MCG/ACT nasal spray Place 1-2 sprays into both nostrils  daily. 16 g 5   levothyroxine (SYNTHROID, LEVOTHROID) 100 MCG tablet Take 1 tablet (100 mcg total) by mouth daily. 90 tablet 3   naproxen sodium (ALEVE) 220 MG tablet Take 220 mg by mouth as needed.     nystatin-triamcinolone ointment (MYCOLOG) Apply topically.     omeprazole (PRILOSEC) 40 MG capsule Take 40 mg by mouth as needed.     rosuvastatin (CRESTOR) 10 MG tablet Take 1 tablet (10 mg total) by mouth daily. 90 tablet 3   traZODone (DESYREL) 50 MG tablet Take 100 mg by mouth at bedtime.     Current Facility-Administered Medications  Medication Dose Route Frequency Provider Last Rate Last Admin   0.9 %  sodium chloride infusion  500 mL Intravenous Continuous Daryel November, MD        Allergies  Allergen Reactions   Augmentin [Amoxicillin-Pot Clavulanate] Swelling    Pt has tolerated amoxicillin prior Black rings around eyes   Imuran [Azathioprine]     Muscle aches, joint aches, fatigue, GI upset, itching, rash    Other     Other reaction(s): Chest Pain   Penicillins     Other reaction(s): Eye Swelling, Headache   Sulfa Antibiotics Shortness Of Breath    Other reaction(s): Unknown  Vibramycin [Doxycycline]     Other reaction(s): Edema, Edema, Unknown   Doxycycline Calcium Swelling    Retained fluid   Latex Rash   Levaquin [Levofloxacin] Rash   Plaquenil [Hydroxychloroquine] Rash      Social History   Socioeconomic History   Marital status: Significant Other    Spouse name: Not on file   Number of children: Not on file   Years of education: Not on file   Highest education level: Not on file  Occupational History   Not on file  Tobacco Use   Smoking status: Never    Passive exposure: Current   Smokeless tobacco: Never  Vaping Use   Vaping Use: Never used  Substance and Sexual Activity   Alcohol use: Yes    Alcohol/week: 2.0 standard drinks of alcohol    Types: 2 Glasses of wine per week    Comment: a few times weekly   Drug use: No   Sexual activity:  Not on file  Other Topics Concern   Not on file  Social History Narrative   Not on file   Social Determinants of Health   Financial Resource Strain: Not on file  Food Insecurity: Not on file  Transportation Needs: Not on file  Physical Activity: Not on file  Stress: Not on file  Social Connections: Not on file  Intimate Partner Violence: Not on file      Family History  Problem Relation Age of Onset   Thyroid disease Mother    Colon polyps Father    Hypertension Father    Hyperlipidemia Father    Cancer Maternal Grandmother    Diabetes Maternal Grandmother    Hypertension Maternal Grandmother    Heart disease Maternal Grandfather    Hyperlipidemia Maternal Grandfather    Hypertension Maternal Grandfather    Diabetes Paternal Grandmother    Stroke Paternal Grandmother    Diabetes Paternal Grandfather    Heart disease Paternal Grandfather    Hyperlipidemia Paternal Grandfather    Hypertension Paternal Grandfather    Colon cancer Neg Hx    Esophageal cancer Neg Hx    Stomach cancer Neg Hx    Rectal cancer Neg Hx     Vitals:   08/24/21 1126  BP: 128/80  Pulse: 74  SpO2: 98%  Weight: 82 kg (180 lb 12.8 oz)    PHYSICAL EXAM: General:  Well appearing. No resp difficulty HEENT: normal Neck: supple. no JVD. Carotids 2+ bilat; no bruits. No lymphadenopathy or thryomegaly appreciated. Cor: PMI nondisplaced. Regular rate & rhythm. No rubs, gallops or murmurs. Lungs: clear Abdomen: soft, nontender, nondistended. No hepatosplenomegaly. No bruits or masses. Good bowel sounds. Extremities: no cyanosis, clubbing, rash, edema Neuro: alert & orientedx3, cranial nerves grossly intact. moves all 4 extremities w/o difficulty. Affect pleasant   ASSESSMENT & PLAN:   1.  Coronary calcification on CT - CT scan reviewed. Dense calcium in prox LAD. No other sites noted - CAD 24 (LAD) - no s/s angina - with CTD at high-risk for early vascular disease  - have started Crestor  10 - repeat calcium score in 1 year - will refer her to Dr. Harrell Gave for further risk factor modification   2. Mixed connective tissue disorder   - Has been seen at [facility] and not felt to have MCTD -> felt to have fibromyalgia  - Echo 08/13/20  EF 60-65% RV ok. No evidence PAH - PFTs 07/14/20: FEV1 2.6 (86%) FVC 3.2 (83%) DLCO 73% - No evidence of  PAH on echo  - CXR and PFTs suggest possible mild interstitial lung disease. Hi-res CT 7/22 no ILD. Mild calcification of LAD - Given no MCTD will not need further screening for PAH at this point.  - Can f/u PRN  Glori Bickers, MD  11:48 AM

## 2021-08-26 IMAGING — CT CT CHEST HIGH RESOLUTION W/O CM
2 of 8 series · 14 of 36 positions shown, 17 images · non-contrast
Comparison: CT abdomen pelvis 06/06/2016.

CLINICAL DATA: Pulmonary hypertension. Mixed connective tissue
disease.

EXAM:
CT CHEST WITHOUT CONTRAST
TECHNIQUE: Multidetector CT imaging of the chest was performed following the
standard protocol without intravenous contrast. High resolution
imaging of the lungs, as well as inspiratory and expiratory imaging,
was performed.

[Series 3: thins · axial · 0.63mm/px · z∈[+1292,+1553]mm · 11 of 484 slices shown, 14 images]
[im 41/484  mediastinal]
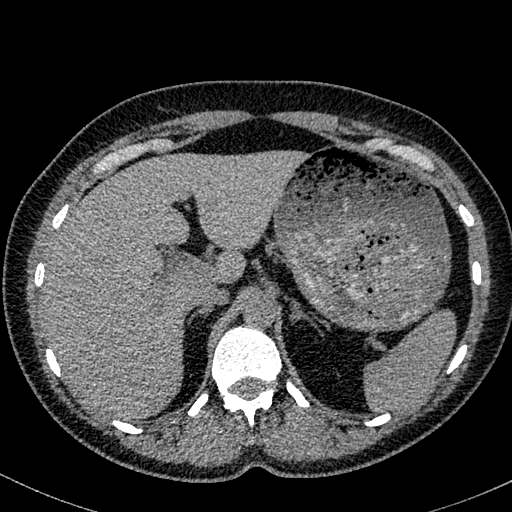
[im 41/484  lung]
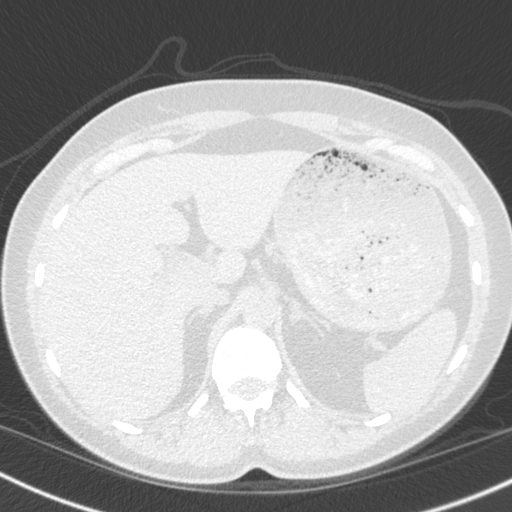
[im 81/484  lung]
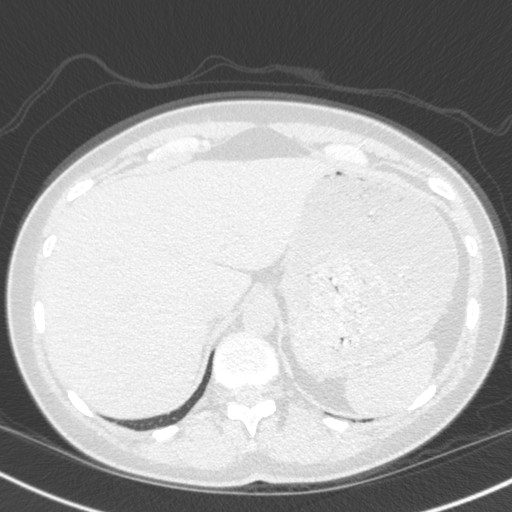
[im 121/484  lung]
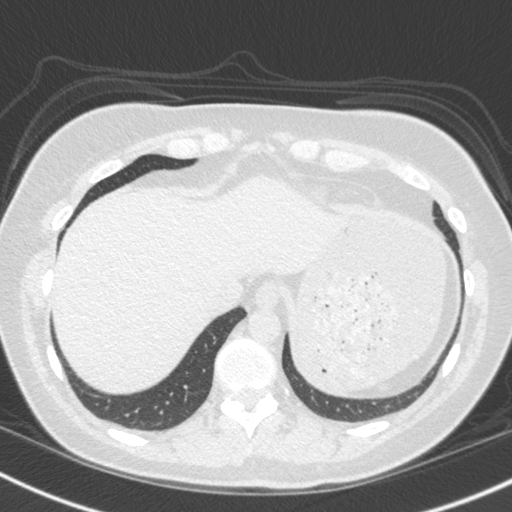
[im 162/484  lung]
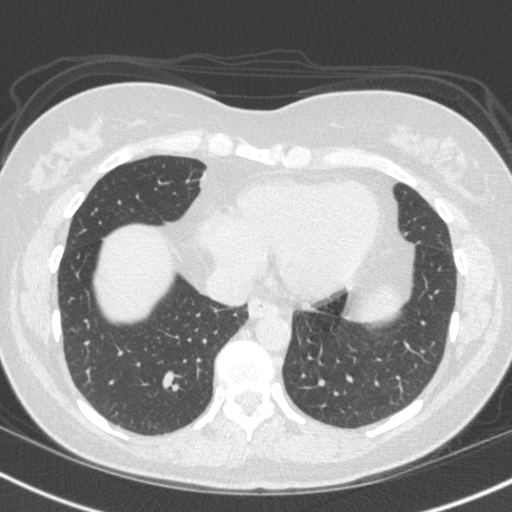
[im 202/484  mediastinal]
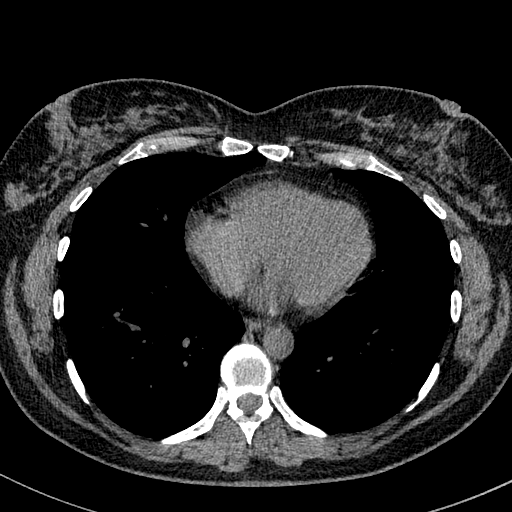
[im 202/484  lung]
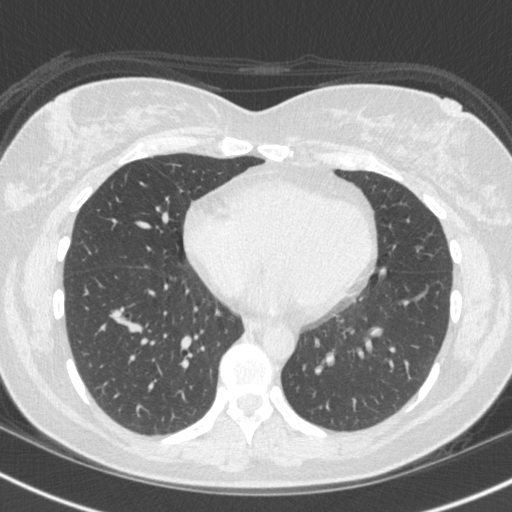
[im 242/484  lung]
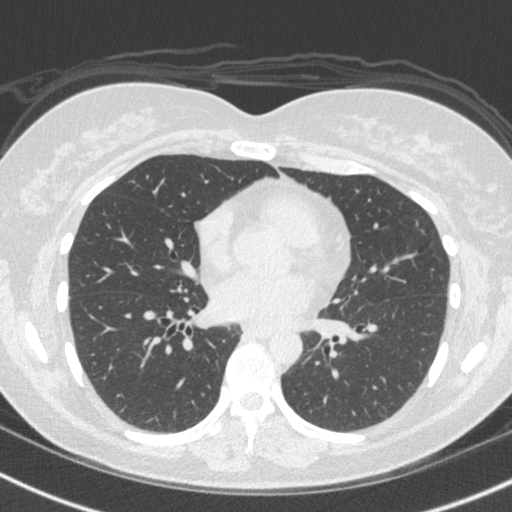
[im 282/484  lung]
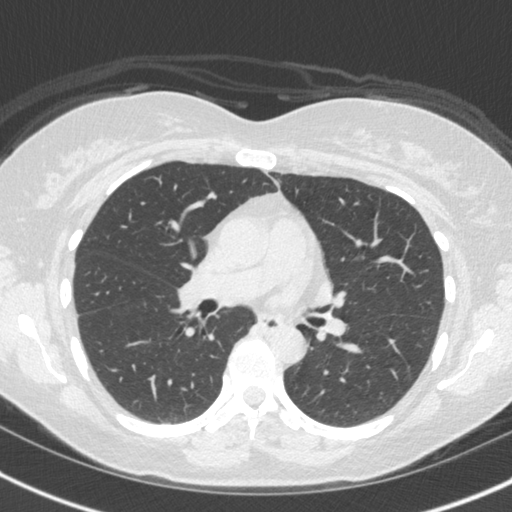
[im 323/484  lung]
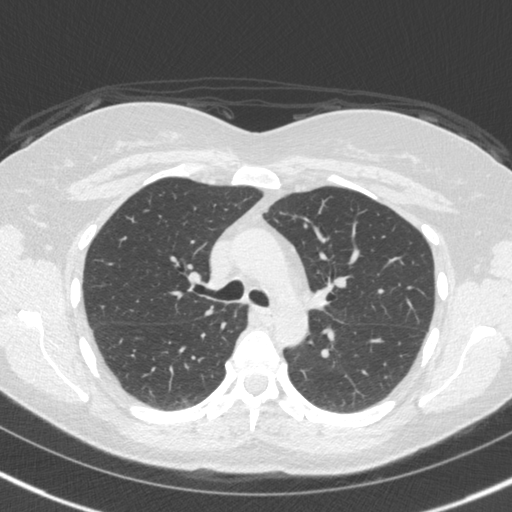
[im 363/484  mediastinal]
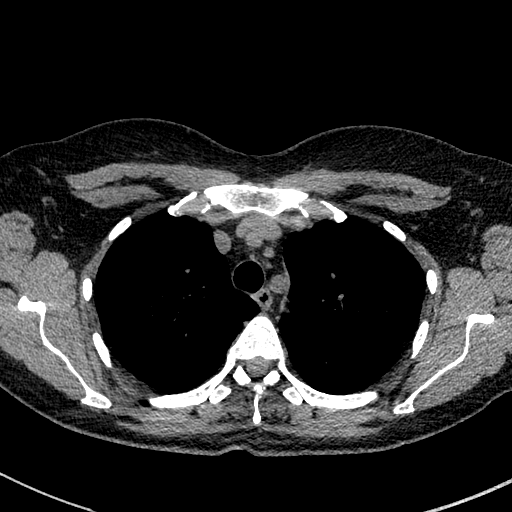
[im 363/484  lung]
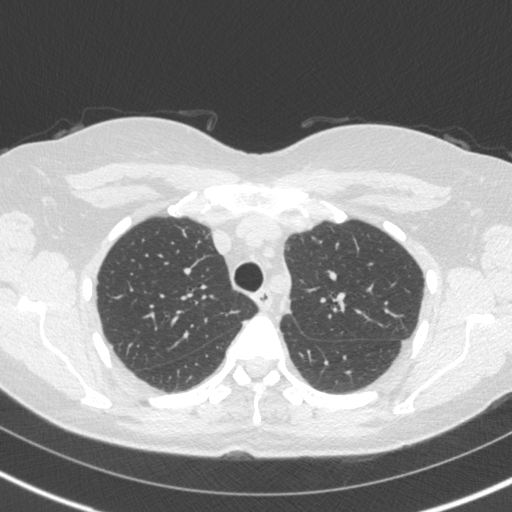
[im 403/484  lung]
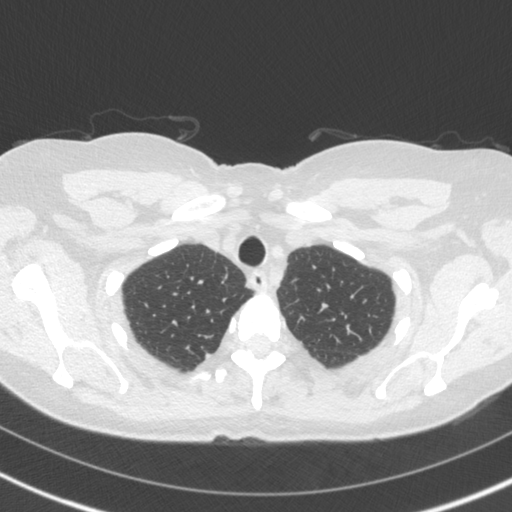
[im 443/484  lung]
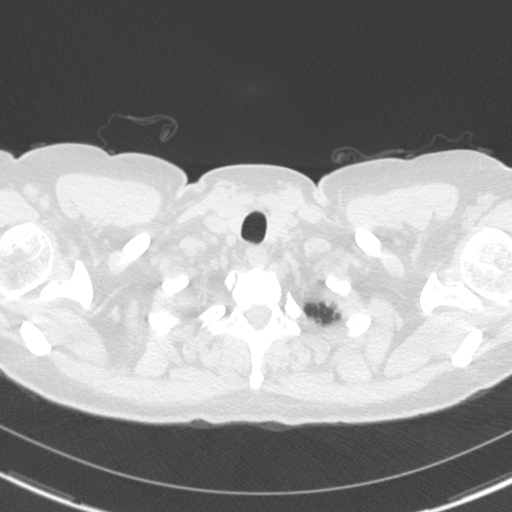

[Series 8: cor soft · coronal · 0.57mm/px · 3 of 127 slices shown]
[im 26/127  lung]
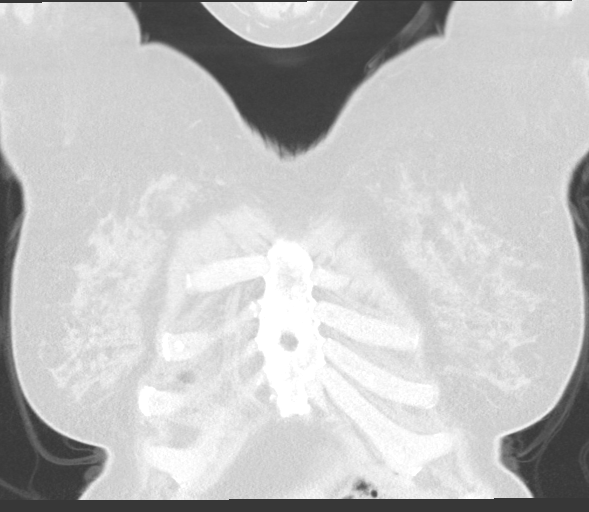
[im 51/127  lung]
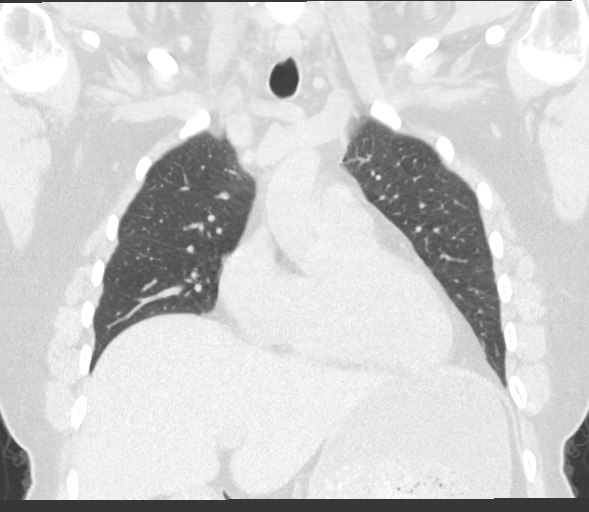
[im 76/127  lung]
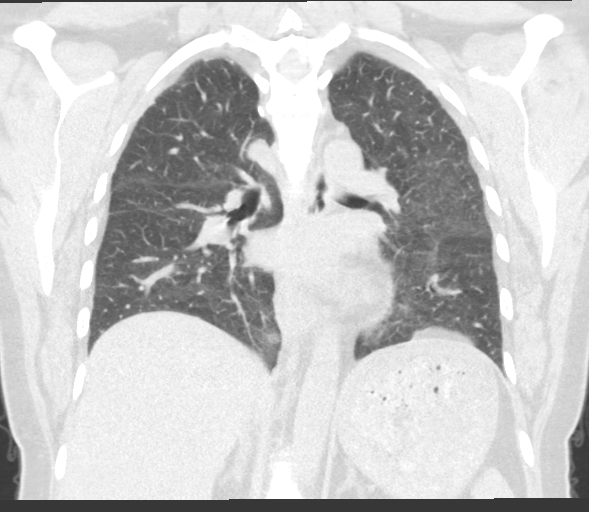

[14 of 36 positions shown; findings below may reference images not displayed]

FINDINGS: Cardiovascular: Left anterior descending coronary artery
calcification. Heart size within normal limits. No pericardial
effusion.

Mediastinum/Nodes: No pathologically enlarged mediastinal or
axillary lymph nodes. Hilar regions are difficult to definitively
evaluate without IV contrast but appear grossly unremarkable.
Esophagus is grossly unremarkable.

Lungs/Pleura: Mild biapical pleuroparenchymal scarring. Negative for
subpleural reticulation, traction bronchiectasis/bronchiolectasis,
ground-glass, architectural distortion or honeycombing. Minimal
dependent subpleural atelectasis. Calcified granulomas. Minimal
lingular scarring. No pleural fluid. Airway is unremarkable. There
is air trapping.

Upper Abdomen: Visualized portions of the liver, adrenal glands,
left kidney, spleen, pancreas and stomach are unremarkable.
Cholecystectomy. No upper abdominal adenopathy.

Musculoskeletal: No worrisome lytic or sclerotic lesions.
IMPRESSION: 1. No evidence of interstitial lung disease. Air trapping is
indicative of small airways disease.
2. Left anterior descending coronary artery calcification.

## 2021-09-22 ENCOUNTER — Encounter (HOSPITAL_BASED_OUTPATIENT_CLINIC_OR_DEPARTMENT_OTHER): Payer: Self-pay | Admitting: Cardiology

## 2021-09-22 ENCOUNTER — Ambulatory Visit (HOSPITAL_BASED_OUTPATIENT_CLINIC_OR_DEPARTMENT_OTHER): Payer: BC Managed Care – PPO | Admitting: Cardiology

## 2021-09-22 VITALS — BP 110/60 | HR 86 | Ht 67.0 in | Wt 179.1 lb

## 2021-09-22 DIAGNOSIS — Z79899 Other long term (current) drug therapy: Secondary | ICD-10-CM | POA: Diagnosis not present

## 2021-09-22 DIAGNOSIS — I2510 Atherosclerotic heart disease of native coronary artery without angina pectoris: Secondary | ICD-10-CM | POA: Diagnosis not present

## 2021-09-22 DIAGNOSIS — E782 Mixed hyperlipidemia: Secondary | ICD-10-CM | POA: Diagnosis not present

## 2021-09-22 DIAGNOSIS — Z7189 Other specified counseling: Secondary | ICD-10-CM | POA: Diagnosis not present

## 2021-09-22 NOTE — Patient Instructions (Signed)
Medication Instructions:  Your Physician recommend you continue on your current medication as directed.    *If you need a refill on your cardiac medications before your next appointment, please call your pharmacy*   Lab Work: Your provider has recommended lab work (fasting lipid). Please have this collected at [facility]. The lab is open 8:00 am - 4:30 pm. Please avoid 12:00p - 1:00p for lunch hour. You do not need an appointment. Please go to [address] [place],  [phone]. This is in the Primary Care office on the 3rd floor, let them know you are there for blood work and they will direct you to the lab.  If you have labs (blood work) drawn today and your tests are completely normal, you will receive your results only by: [place] (if you have MyChart) OR A paper copy in the mail If you have any lab test that is abnormal or we need to change your treatment, we will call you to review the results.   Testing/Procedures: None ordered today   Follow-Up: At [facility], you and your health needs are our priority.  As part of our continuing mission to provide you with exceptional heart care, we have created designated Provider Care Teams.  These Care Teams include your primary Cardiologist (physician) and Advanced Practice Providers (APPs -  Physician Assistants and Nurse Practitioners) who all work together to provide you with the care you need, when you need it.  We recommend signing up for the patient portal called "MyChart".  Sign up information is provided on this After Visit Summary.  MyChart is used to connect with patients for Virtual Visits (Telemedicine).  Patients are able to view lab/test results, encounter notes, upcoming appointments, etc.  Non-urgent messages can be sent to your provider as well.   To learn more about what you can do with MyChart, go to [email or web address].    Your next appointment:   1 year(s)  The  format for your next appointment:   In Person  Provider:   Buford Dresser, MD{

## 2021-09-22 NOTE — Progress Notes (Signed)
Cardiology Office Note:    Date:  09/22/2021   ID:  [name], DOB 1970/10/06, MRN [id number]  PCP:  Hoyt Koch, MD  Cardiologist:  Buford Dresser, MD  Referring MD: Jolaine Artist, MD   CC: new patient consultation for elevated cholesterol  History of Present Illness:    [name] is a 51 y.o. female with a hx of hyperlipidemia, hypothyroidism, fibromyalgia, and GERD who is seen as a new consult at the request of Bensimhon, Shaune Pascal, MD for the evaluation and management of elevated cholesterol.  She was seen on 08/24/2021 by Dr. Haroldine Laws for established CHF. Her DLCO was down and her CT showed mild calcification of LAD. She was referred to cardiology for further risk factor modification. She was started on Crestor 10.   Cardiovascular risk factors: Comorbid conditions: hyperlipidemia Family history: Her mother has not had any issues. Prior pertinent testing and/or incidental findings: reviewed calcium score today Exercise level: She is usually very active in gardening and frequently develops a lot of swelling in her hands after over exertion. Her face also gets erythematous after some time that it takes hours to get back to normal.  Current diet: She has made many changes including cutting out alcohol and changing her diet to gluten free. She eats a lot of fresh cucumbers and oysters to cut out red meat. She has eaten only gluten free bread. She doesn't usually eat breakfast.  Today, she is here to review her test results. We went into detail of her  elevated calcium scores and medication recommendations.  This morning, she had problems bending down to pick up a hair clip due to inflammation which has been an ongoing problem. She has chronic pain of her hips, back, hands, and wrists. She was also diagnosed with fibromyalgia. She doesn't know what could be the cause but she has been taking Tylenol for the pain. She experiences bloating due to  the inflammation as well.   She has had previous issues with carpal tunnel and joint pain. She does have numbness in her hands and a "tingling" feeling. This often results in swelling after over exertion such as raking or shoveling in her garden. She has had a tremor before where she couldn't get any feeling in her tendon.   She also has complications with diarrhea and breaking out on her arms.   She was around 190 lbs and has lost some weight since June. 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 She used to have a FitBit and previously walked up to 10,000 steps. She wants to try yoga but has been having issues due to fatigue after her job.  She has a pool but is not able to use it due to the chlorine irritating her skin. After sitting in saltwater, she does feel better. She also used to have less stress which she thinks could be causing the other issues as well.   She does take her synthroid in the morning and her cholesterol medicine around 5 pm.   She denies any palpitations, chest pain, shortness of breath, or peripheral edema. No lightheadedness, headaches, syncope, orthopnea, or PND.    Past Medical History:  Diagnosis Date   Allergy    Anxiety    Complication of anesthesia    " wakes up  crying"   Depression    Eczema    GERD (gastroesophageal reflux disease)  Hyperlipidemia    Hypothyroidism    Interstitial cystitis    Ligament tear    Left elbow   Menorrhagia    Migraines    Neuromuscular disorder (HCC)    PONV (postoperative nausea and vomiting)    likes phenergan and scopolamine patch   Tennis elbow    Tennis elbow    Thyroid disease    Trigger finger of right hand    Urticaria     Past Surgical History:  Procedure Laterality Date   carpal tunnel release Bilateral 2010   tennis elbow also done   CESAREAN SECTION     x 2   CHOLECYSTECTOMY  2003   DIAGNOSTIC LAPAROSCOPY  20002   thru  belly button to check for endometriosis   DILITATION & CURRETTAGE/HYSTROSCOPY WITH NOVASURE ABLATION N/A 11/18/2019   Procedure: DILATATION & CURETTAGE/HYSTEROSCOPY WITH NOVASURE ABLATION;  Surgeon: Brien Few, MD;  Location: [facility];  Service: Gynecology;  Laterality: N/A;   ELBOW SURGERY     TRIGGER FINGER RELEASE Right    3rd and 4th digit    Current Medications: Current Outpatient Medications on File Prior to Visit  Medication Sig   cetirizine (ZYRTEC) 10 MG tablet Take 1 tablet (10 mg total) by mouth daily.   EPINEPHrine (AUVI-Q) 0.3 mg/0.3 mL IJ SOAJ injection Use as directed for severe allergic reaction   famotidine (PEPCID) 40 MG tablet TAKE 1 TABLET(40 MG) BY MOUTH EVERY EVENING   fluticasone (FLONASE) 50 MCG/ACT nasal spray Place 1-2 sprays into both nostrils daily.   levothyroxine (SYNTHROID, LEVOTHROID) 100 MCG tablet Take 1 tablet (100 mcg total) by mouth daily.   naproxen sodium (ALEVE) 220 MG tablet Take 220 mg by mouth as needed.   nystatin-triamcinolone ointment (MYCOLOG) Apply topically.   omeprazole (PRILOSEC) 40 MG capsule Take 40 mg by mouth as needed.   rosuvastatin (CRESTOR) 10 MG tablet Take 1 tablet (10 mg total) by mouth daily.   traZODone (DESYREL) 50 MG tablet Take 100 mg by mouth at bedtime.   Current Facility-Administered Medications on File Prior to Visit  Medication   0.9 %  sodium chloride infusion     Allergies:   Augmentin [amoxicillin-pot clavulanate], Imuran [azathioprine], Other, Penicillins, Sulfa antibiotics, Vibramycin [doxycycline], Doxycycline calcium, Latex, Levaquin [levofloxacin], and Plaquenil [hydroxychloroquine]   Social History   Tobacco Use   Smoking status: Never    Passive exposure: Current   Smokeless tobacco: Never  Vaping Use   Vaping Use: Never used  Substance Use Topics   Alcohol use: Yes    Alcohol/week: 2.0 standard drinks of alcohol    Types: 2 Glasses of wine per week    Comment: a few times  weekly   Drug use: No    Family History: family history includes Cancer in her maternal grandmother; Colon polyps in her father; Diabetes in her maternal grandmother, paternal grandfather, and paternal grandmother; Heart disease in her maternal grandfather and paternal grandfather; Hyperlipidemia in her father, maternal grandfather, and paternal grandfather; Hypertension in her father, maternal grandfather, maternal grandmother, and paternal grandfather; Stroke in her paternal grandmother; Thyroid disease in her mother. There is no history of Colon cancer, Esophageal cancer, Stomach cancer, or Rectal cancer.  ROS:   Please see the history of present illness.  Additional pertinent ROS: Constitutional: Negative for chills, fever, night sweats, unintentional weight loss  HENT: Negative for ear pain and hearing loss.   Eyes: Negative for loss of vision and eye pain.  Respiratory: Negative for cough, sputum,  wheezing.   Cardiovascular: See HPI. Gastrointestinal: Negative for melena, and hematochezia. Positive for abdominal pain, diarrhea Genitourinary: Negative for dysuria and hematuria.  Musculoskeletal: Negative for falls and myalgias. Positive for joint pain Skin: Negative for itching and rash. Positive for numbness, inflammation Neurological: Negative for focal weakness, focal sensory changes and loss of consciousness. Positive for tremors Endo/Heme/Allergies: Does not bruise/bleed easily.    EKGs/Labs/Other Studies Reviewed:    The following studies were reviewed today:  12/09/2020 CT Cardiac Scoring: IMPRESSION: Coronary calcium score of 22.4. This was 92nd percentile for age-, race-, and sex-matched controls.  08/13/2020 Echo:  1. Left ventricular ejection fraction, by estimation, is 60 to 65%. The  left ventricle has normal function. The left ventricle has no regional  wall motion abnormalities. Left ventricular diastolic parameters were  normal.   2. Right ventricular systolic  function is normal. The right ventricular  size is normal. Tricuspid regurgitation signal is inadequate for assessing  PA pressure.   3. The mitral valve is normal in structure. Trivial mitral valve  regurgitation. No evidence of mitral stenosis.   4. The aortic valve is tricuspid. Aortic valve regurgitation is not  visualized. No aortic stenosis is present.   5. The inferior vena cava is normal in size with greater than 50%  respiratory variability, suggesting right atrial pressure of 3 mmHg.    EKG:  EKG is personally reviewed.   09/22/2021: NSR at 86 bpm  Recent Labs: 11/13/2020: ALT 78; BUN 6; Creatinine, Ser 0.78; Potassium 3.9; Sodium 138  Recent Lipid Panel    Component Value Date/Time   CHOL 230 (H) 11/13/2020 1622   CHOL 261 (H) 02/02/2018 1049   TRIG 194 (H) 11/13/2020 1622   HDL 66 11/13/2020 1622   HDL 72 02/02/2018 1049   CHOLHDL 3.5 11/13/2020 1622   VLDL 39 11/13/2020 1622   LDLCALC 125 (H) 11/13/2020 1622   LDLCALC 144 (H) 02/02/2018 1049    Physical Exam:    VS:  BP 110/60 (BP Location: Right Arm, Patient Position: Sitting, Cuff Size: Large)   Pulse 86   Ht '5\' 7"'$  (1.702 m)   Wt 179 lb 1.6 oz (81.2 kg)   BMI 28.05 kg/m     Wt Readings from Last 3 Encounters:  09/22/21 179 lb 1.6 oz (81.2 kg)  08/24/21 180 lb 12.8 oz (82 kg)  07/27/21 183 lb (83 kg)    GEN: Well nourished, well developed in no acute distress HEENT: Normal, moist mucous membranes NECK: No JVD CARDIAC: regular rhythm, normal S1 and S2, no rubs or gallops. No murmur. VASCULAR: Radial and DP pulses 2+ bilaterally. No carotid bruits RESPIRATORY:  Clear to auscultation without rales, wheezing or rhonchi  ABDOMEN: Soft, non-tender, non-distended MUSCULOSKELETAL:  Ambulates independently SKIN: Warm and dry, no edema NEUROLOGIC:  Alert and oriented x 3. No focal neuro deficits noted. PSYCHIATRIC:  Normal affect    ASSESSMENT:    1. Mixed hyperlipidemia   2. Coronary artery calcification  seen on CAT scan   3. Cardiac risk counseling   4. Counseling on health promotion and disease prevention   5. Medication management    PLAN:    Coronary calcium Mixed hyperlipidemia -CAC score 22.4 -We reviewed the calcium score at length, including actual images as well as the graph showing mortality based on calcium score. We discussed the pathophysiology of cholesterol plaque formation, the role of calcium and why it is a marker, how plaque is key to acute MI/CVA, and how known plaque  is managed with medications.   -she is tolerating rosuvastatin 10 mg daily, continue -recheck fasting lipids -reviewed red flag warning signs that need immediate medical attention  Cardiac risk counseling and prevention recommendations: -recommend heart healthy/Mediterranean diet, with whole grains, fruits, vegetable, fish, lean meats, nuts, and olive oil. Limit salt. -recommend moderate walking, 3-5 times/week for 30-50 minutes each session. Aim for at least 150 minutes.week. Goal should be pace of 3 miles/hours, or walking 1.5 miles in 30 minutes -recommend avoidance of tobacco products. Avoid excess alcohol. -ASCVD risk score: The 10-year ASCVD risk score (Arnett DK, et al., 2019) is: 1.4%   Values used to calculate the score:     Age: 54 years     Sex: Female     Is Non-Hispanic African American: No     Diabetic: No     Tobacco smoker: No     Systolic Blood Pressure: 354 mmHg     Is BP treated: No     HDL Cholesterol: 66 mg/dL     Total Cholesterol: 230 mg/dL    Plan for follow up: 1 year  Buford Dresser, MD, PhD, [facility]    Medication Adjustments/Labs and Tests Ordered: Current medicines are reviewed at length with the patient today.  Concerns regarding medicines are outlined above.  Orders Placed This Encounter  Procedures   Lipid panel   EKG 12-Lead   No orders of the defined types were placed in this encounter.   Patient Instructions  Medication  Instructions:  Your Physician recommend you continue on your current medication as directed.    *If you need a refill on your cardiac medications before your next appointment, please call your pharmacy*   Lab Work: Your provider has recommended lab work (fasting lipid). Please have this collected at [facility] [place]. The lab is open 8:00 am - 4:30 pm. Please avoid 12:00p - 1:00p for lunch hour. You do not need an appointment. Please go to [address] [place], [place] [address]. This is in the Primary Care office on the 3rd floor, let them know you are there for blood work and they will direct you to the lab.  If you have labs (blood work) drawn today and your tests are completely normal, you will receive your results only by: [place] (if you have MyChart) OR A paper copy in the mail If you have any lab test that is abnormal or we need to change your treatment, we will call you to review the results.   Testing/Procedures: None ordered today   Follow-Up: At [facility], you and your health needs are our priority.  As part of our continuing mission to provide you with exceptional heart care, we have created designated Provider Care Teams.  These Care Teams include your primary Cardiologist (physician) and Advanced Practice Providers (APPs -  Physician Assistants and Nurse Practitioners) who all work together to provide you with the care you need, when you need it.  We recommend signing up for the patient portal called "MyChart".  Sign up information is provided on this After Visit Summary.  MyChart is used to connect with patients for Virtual Visits (Telemedicine).  Patients are able to view lab/test results, encounter notes, upcoming appointments, etc.  Non-urgent messages can be sent to your provider as well.   To learn more about what you can do with MyChart, go to [email or web address].    Your next appointment:   1 year(s)  The  format for  your next appointment:   In Person  Provider:   Buford Dresser, MD{             I,Jenifer Velasquez,acting as a scribe for Buford Dresser, MD.,have documented all relevant documentation on the behalf of Buford Dresser, MD,as directed by  Buford Dresser, MD while in the presence of Buford Dresser, MD.  I, Buford Dresser, MD, have reviewed all documentation for this visit. The documentation on 10/26/21 for the exam, diagnosis, procedures, and orders are all accurate and complete.  Signed, Buford Dresser, MD PhD 09/22/2021     [facility]

## 2021-10-01 NOTE — Progress Notes (Deleted)
Office Visit Note  Patient: [name]             Date of Birth: 29-May-1970           MRN: [id number]             PCP: Hoyt Koch, MD Referring: Hoyt Koch, * Visit Date: 10/14/2021 Occupation: '@GUAROCC'$ @  Subjective:  No chief complaint on file.   History of Present Illness: [name] is a 51 y.o. female ***   Activities of Daily Living:  Patient reports morning stiffness for *** {minute/hour:19697}.   Patient {ACTIONS;DENIES/REPORTS:21021675::"Denies"} nocturnal pain.  Difficulty dressing/grooming: {ACTIONS;DENIES/REPORTS:21021675::"Denies"} Difficulty climbing stairs: {ACTIONS;DENIES/REPORTS:21021675::"Denies"} Difficulty getting out of chair: {ACTIONS;DENIES/REPORTS:21021675::"Denies"} Difficulty using hands for taps, buttons, cutlery, and/or writing: {ACTIONS;DENIES/REPORTS:21021675::"Denies"}  No Rheumatology ROS completed.   PMFS History:  Patient Active Problem List   Diagnosis Date Noted   Sinusitis, acute, maxillary 07/11/2021   Abdominal distension (gaseous) 07/08/2021   Elevated ALT measurement 07/08/2021   Gastro-esophageal reflux disease without esophagitis 07/08/2021   Hemorrhage of anus and rectum 07/08/2021   Irritable bowel syndrome with both constipation and diarrhea 07/08/2021   Pain in right hand 12/21/2020   Mixed connective tissue disease ([place]) 10/02/2020   Primary osteoarthritis of both hands 10/02/2020   Acquired trigger finger of right middle finger 08/27/2020   Neck pain 06/23/2020   Rash and other nonspecific skin eruption 05/29/2020   Generalized anxiety disorder 02/02/2018   Allergic urticaria 02/02/2018   Acquired hypothyroidism 02/02/2018   Elevated cholesterol 02/02/2018    Past Medical History:  Diagnosis Date   Allergy    Anxiety    Complication of anesthesia    " wakes up  crying"   Depression    Eczema    GERD (gastroesophageal reflux disease)    Hyperlipidemia    Hypothyroidism     Interstitial cystitis    Ligament tear    Left elbow   Menorrhagia    Migraines    Neuromuscular disorder (HCC)    PONV (postoperative nausea and vomiting)    likes phenergan and scopolamine patch   Tennis elbow    Tennis elbow    Thyroid disease    Trigger finger of right hand    Urticaria     Family History  Problem Relation Age of Onset   Thyroid disease Mother    Colon polyps Father    Hypertension Father    Hyperlipidemia Father    Cancer Maternal Grandmother    Diabetes Maternal Grandmother    Hypertension Maternal Grandmother    Heart disease Maternal Grandfather    Hyperlipidemia Maternal Grandfather    Hypertension Maternal Grandfather    Diabetes Paternal Grandmother    Stroke Paternal Grandmother    Diabetes Paternal Grandfather    Heart disease Paternal Grandfather    Hyperlipidemia Paternal Grandfather    Hypertension Paternal Grandfather    Colon cancer Neg Hx    Esophageal cancer Neg Hx    Stomach cancer Neg Hx    Rectal cancer Neg Hx    Past Surgical History:  Procedure Laterality Date   carpal tunnel release Bilateral 2010   tennis elbow also done   CESAREAN SECTION     x 2   CHOLECYSTECTOMY  2003   DIAGNOSTIC LAPAROSCOPY  20002   thru belly button to check for endometriosis   DILITATION & CURRETTAGE/HYSTROSCOPY WITH NOVASURE ABLATION N/A 11/18/2019   Procedure: DILATATION & CURETTAGE/HYSTEROSCOPY WITH NOVASURE ABLATION;  Surgeon: Brien Few, MD;  Location:  [facility];  Service: Gynecology;  Laterality: N/A;   ELBOW SURGERY     TRIGGER FINGER RELEASE Right    3rd and 4th digit   Social History   Social History Narrative   Not on file   Immunization History  Administered Date(s) Administered   Influenza,inj,Quad PF,6+ Mos 11/17/2015, 11/18/2016, 12/08/2017, 12/14/2018   Influenza-Unspecified 12/08/2017, 12/11/2020   Moderna Sars-Covid-2 Vaccination 02/04/2020   PFIZER Comirnaty(Gray Top)Covid-19 Tri-Sucrose Vaccine  04/12/2019, 05/03/2019     Objective: Vital Signs: There were no vitals taken for this visit.   Physical Exam   Musculoskeletal Exam: ***  CDAI Exam: CDAI Score: -- Patient Global: --; Provider Global: -- Swollen: --; Tender: -- Joint Exam 10/14/2021   No joint exam has been documented for this visit   There is currently no information documented on the homunculus. Go to the Rheumatology activity and complete the homunculus joint exam.  Investigation: No additional findings.  Imaging: No results found.  Recent Labs: Lab Results  Component Value Date   WBC 8.3 08/05/2020   HGB 13.7 08/05/2020   PLT 334 08/05/2020   NA 138 11/13/2020   K 3.9 11/13/2020   CL 103 11/13/2020   CO2 27 11/13/2020   GLUCOSE 88 11/13/2020   BUN 6 11/13/2020   CREATININE 0.78 11/13/2020   BILITOT 0.7 11/13/2020   ALKPHOS 133 (H) 11/13/2020   AST 64 (H) 11/13/2020   ALT 78 (H) 11/13/2020   PROT 6.9 11/13/2020   ALBUMIN 4.0 11/13/2020   CALCIUM 9.2 11/13/2020   GFRAA 114 08/05/2020   QFTBGOLDPLUS NEGATIVE 05/04/2020    Speciality Comments: PLQ EYE EXAM 06/23/2020 WNL PLQ- , rash, sob,  Imuran-headache, abdominal pain,flu like symptoms 09/02/20 HRCT negative for ILD,08/13/20 ECHO cardio  Procedures:  No procedures performed Allergies: Augmentin [amoxicillin-pot clavulanate], Imuran [azathioprine], Other, Penicillins, Sulfa antibiotics, Vibramycin [doxycycline], Doxycycline calcium, Latex, Levaquin [levofloxacin], and Plaquenil [hydroxychloroquine]   Assessment / Plan:     Visit Diagnoses: No diagnosis found.  Orders: No orders of the defined types were placed in this encounter.  No orders of the defined types were placed in this encounter.   Face-to-face time spent with patient was *** minutes. Greater than 50% of time was spent in counseling and coordination of care.  Follow-Up Instructions: No follow-ups on file.   Earnestine Mealing, CMA  Note - This record has been created  using Editor, commissioning.  Chart creation errors have been sought, but may not always  have been located. Such creation errors do not reflect on  the standard of medical care.

## 2021-10-02 LAB — LIPID PANEL
Chol/HDL Ratio: 2.4 ratio (ref 0.0–4.4)
Cholesterol, Total: 148 mg/dL (ref 100–199)
HDL: 61 mg/dL (ref >39)
LDL Chol Calc (NIH): 72 mg/dL (ref 0–99)
Triglycerides: 78 mg/dL (ref 0–149)
VLDL Cholesterol Cal: 15 mg/dL (ref 5–40)

## 2021-10-12 ENCOUNTER — Telehealth: Payer: Self-pay | Admitting: Rheumatology

## 2021-10-12 NOTE — Telephone Encounter (Signed)
Patient called the office stating she is seeing the rheumatologist with [facility] that Dr. Estanislado Pandy referred her to on Sept 13th. Patient states she has an appointment with Dr. Estanislado Pandy on 10/14/21. Patient states she isn't sure if she needs to see Dr. Estanislado Pandy before [place] but she is for sure going to [facility]. Patient states if she is to see her doctor at [facility] should she just reschedule her appointment with Dr. Estanislado Pandy to a later date. Please advise.

## 2021-10-13 ENCOUNTER — Telehealth: Payer: Self-pay | Admitting: Cardiology

## 2021-10-13 DIAGNOSIS — E782 Mixed hyperlipidemia: Secondary | ICD-10-CM

## 2021-10-13 DIAGNOSIS — I2510 Atherosclerotic heart disease of native coronary artery without angina pectoris: Secondary | ICD-10-CM

## 2021-10-13 DIAGNOSIS — Z7189 Other specified counseling: Secondary | ICD-10-CM

## 2021-10-13 MED ORDER — NEXLIZET 180-10 MG PO TABS: 1.0000 | ORAL_TABLET | Freq: Every day | ORAL | 3 refills | Status: DC

## 2021-10-13 NOTE — Telephone Encounter (Signed)
Pt is calling in regards to labs. She is requesting call back after 3:00 P.M.

## 2021-10-13 NOTE — Telephone Encounter (Signed)
Results called to patient who verbalizes understanding! Labs ordered and mailed, prescriptions updated and sent to pharmacy on file.   Will route to [employer] for prior auth.     "Known coronary atherosclerosis on CT. LDL goal <70. Continue Rosuvastatin. Recommend addition of Nexlizet. This helps to lower cholesterol but also reduces cardiovascular risk. Repeat FLP/LFT in 2-3 months."

## 2021-10-13 NOTE — Telephone Encounter (Signed)
Yes, she may schedule an appointment after the visit at [facility].

## 2021-10-13 NOTE — Telephone Encounter (Signed)
Left message to advise patient she may schedule an appointment after the visit at [facility]. Advised patient to call the office to reschedule her appointment.

## 2021-10-14 ENCOUNTER — Ambulatory Visit: Payer: BC Managed Care – PPO | Admitting: Rheumatology

## 2021-10-14 DIAGNOSIS — Z8261 Family history of arthritis: Secondary | ICD-10-CM

## 2021-10-14 DIAGNOSIS — F411 Generalized anxiety disorder: Secondary | ICD-10-CM

## 2021-10-14 DIAGNOSIS — E7800 Pure hypercholesterolemia, unspecified: Secondary | ICD-10-CM

## 2021-10-14 DIAGNOSIS — L500 Allergic urticaria: Secondary | ICD-10-CM

## 2021-10-14 DIAGNOSIS — R682 Dry mouth, unspecified: Secondary | ICD-10-CM

## 2021-10-14 DIAGNOSIS — M65341 Trigger finger, right ring finger: Secondary | ICD-10-CM

## 2021-10-14 DIAGNOSIS — M7062 Trochanteric bursitis, left hip: Secondary | ICD-10-CM

## 2021-10-14 DIAGNOSIS — M65331 Trigger finger, right middle finger: Secondary | ICD-10-CM

## 2021-10-14 DIAGNOSIS — E038 Other specified hypothyroidism: Secondary | ICD-10-CM

## 2021-10-14 DIAGNOSIS — R5383 Other fatigue: Secondary | ICD-10-CM

## 2021-10-14 DIAGNOSIS — Z79899 Other long term (current) drug therapy: Secondary | ICD-10-CM

## 2021-10-14 DIAGNOSIS — M7712 Lateral epicondylitis, left elbow: Secondary | ICD-10-CM

## 2021-10-14 DIAGNOSIS — K9041 Non-celiac gluten sensitivity: Secondary | ICD-10-CM

## 2021-10-14 DIAGNOSIS — M19041 Primary osteoarthritis, right hand: Secondary | ICD-10-CM

## 2021-10-14 DIAGNOSIS — Z840 Family history of diseases of the skin and subcutaneous tissue: Secondary | ICD-10-CM

## 2021-10-14 DIAGNOSIS — M359 Systemic involvement of connective tissue, unspecified: Secondary | ICD-10-CM

## 2021-10-14 DIAGNOSIS — M7918 Myalgia, other site: Secondary | ICD-10-CM

## 2021-10-18 LAB — HM MAMMOGRAPHY

## 2021-10-18 NOTE — Telephone Encounter (Signed)
Hey this patient is following up on prior auth

## 2021-10-18 NOTE — Telephone Encounter (Signed)
Patient was calling back for update. Please advise  

## 2021-10-20 ENCOUNTER — Ambulatory Visit: Payer: BC Managed Care – PPO | Admitting: Rheumatology

## 2021-10-20 NOTE — Telephone Encounter (Signed)
Spoke with pt's pharmacy. Medication did not require prior authorization. Nurse left a detail message making pt aware.

## 2021-10-22 ENCOUNTER — Encounter: Payer: Self-pay | Admitting: *Deleted

## 2021-11-01 ENCOUNTER — Ambulatory Visit: Payer: BC Managed Care – PPO | Admitting: Internal Medicine

## 2021-11-01 ENCOUNTER — Encounter: Payer: Self-pay | Admitting: Internal Medicine

## 2021-11-01 VITALS — BP 110/70 | HR 76 | Ht 67.0 in | Wt 180.0 lb

## 2021-11-01 DIAGNOSIS — E039 Hypothyroidism, unspecified: Secondary | ICD-10-CM | POA: Diagnosis not present

## 2021-11-01 NOTE — Patient Instructions (Signed)

## 2021-11-01 NOTE — Progress Notes (Signed)
Name: [name]  MRN/ DOB: [id number], 03-07-1970    Age/ Sex: 51 y.o., female    PCP: Myrlene Broker, MD   Reason for Endocrinology Evaluation: Hypothyroidism     Date of Initial Endocrinology Evaluation: 11/01/2021     HPI: [name] is a 52 y.o. female with a past medical history of Positive ANA, fibromyalgia, . The patient presented for initial endocrinology clinic visit on 11/01/2021 for consultative assistance with her Hypothyroidism.   [name] has been diagnosed with hypothyroidism > 20 yrs ago  Of note, the [name] had normal TPO Ab's in 2020   [name] follows with rheumatology for positive ANA, dry mouth and eyes   [name] has chronic fatigue  Has IBS- D Denies palpitations  Feels flushed at times No tremors  No Biotin   Levothyroxine 100 mcg daily   Mother with thyroid disease        HISTORY:  Past Medical History:  Past Medical History:  Diagnosis Date   Allergy    Anxiety    Complication of anesthesia    " wakes up  crying"   Depression    Eczema    GERD (gastroesophageal reflux disease)    Hyperlipidemia    Hypothyroidism    Interstitial cystitis    Ligament tear    Left elbow   Menorrhagia    Migraines    Neuromuscular disorder (HCC)    PONV (postoperative nausea and vomiting)    likes phenergan and scopolamine patch   Tennis elbow    Tennis elbow    Thyroid disease    Trigger finger of right hand    Urticaria    Past Surgical History:  Past Surgical History:  Procedure Laterality Date   carpal tunnel release Bilateral 2010   tennis elbow also done   CESAREAN SECTION     x 2   CHOLECYSTECTOMY  2003   DIAGNOSTIC LAPAROSCOPY  20002   thru belly button to check for endometriosis   DILITATION & CURRETTAGE/HYSTROSCOPY WITH NOVASURE ABLATION N/A 11/18/2019   Procedure: DILATATION & CURETTAGE/HYSTEROSCOPY WITH NOVASURE ABLATION;  Surgeon: Olivia Mackie, MD;  Location: [facility];  Service: Gynecology;   Laterality: N/A;   ELBOW SURGERY     TRIGGER FINGER RELEASE Right    3rd and 4th digit    Social History:  reports that [name] has never smoked. [name] has been exposed to tobacco smoke. [name] has never used smokeless tobacco. [name] reports current alcohol use of about 2.0 standard drinks of alcohol per week. [name] reports that [name] does not use drugs. Family History: family history includes Cancer in her maternal grandmother; Colon polyps in her father; Diabetes in her maternal grandmother, paternal grandfather, and paternal grandmother; Heart disease in her maternal grandfather and paternal grandfather; Hyperlipidemia in her father, maternal grandfather, and paternal grandfather; Hypertension in her father, maternal grandfather, maternal grandmother, and paternal grandfather; Stroke in her paternal grandmother; Thyroid disease in her mother.   HOME MEDICATIONS: Allergies as of 11/01/2021       Reactions   Augmentin [amoxicillin-pot Clavulanate] Swelling   [name] has tolerated amoxicillin prior Black rings around eyes   Imuran [azathioprine]    Muscle aches, joint aches, fatigue, GI upset, itching, rash    Other    Other reaction(s): Chest Pain   Penicillins    Other reaction(s): Eye Swelling, Headache   Sulfa Antibiotics Shortness Of Breath   Other reaction(s): Unknown   Vibramycin [doxycycline]    Other  reaction(s): Edema, Edema, Unknown   Doxycycline Calcium Swelling   Retained fluid   Latex Rash   Levaquin [levofloxacin] Rash   Plaquenil [hydroxychloroquine] Rash        Medication List        Accurate as of November 01, 2021  6:56 AM. If you have any questions, ask your nurse or doctor.          cetirizine 10 MG tablet Commonly known as: ZYRTEC Take 1 tablet (10 mg total) by mouth daily.   EPINEPHrine 0.3 mg/0.3 mL Soaj injection Commonly known as: Auvi-Q Use as directed for severe allergic reaction   famotidine 40 MG tablet Commonly known as: PEPCID TAKE 1 TABLET(40 MG) BY  MOUTH EVERY EVENING   fluticasone 50 MCG/ACT nasal spray Commonly known as: FLONASE Place 1-2 sprays into both nostrils daily.   levothyroxine 100 MCG tablet Commonly known as: SYNTHROID Take 1 tablet (100 mcg total) by mouth daily.   naproxen sodium 220 MG tablet Commonly known as: ALEVE Take 220 mg by mouth as needed.   Nexlizet 180-10 MG Tabs Generic drug: Bempedoic Acid-Ezetimibe Take 1 tablet by mouth daily.   nystatin-triamcinolone ointment Commonly known as: MYCOLOG Apply topically.   omeprazole 40 MG capsule Commonly known as: PRILOSEC Take 40 mg by mouth as needed.   rosuvastatin 10 MG tablet Commonly known as: CRESTOR Take 1 tablet (10 mg total) by mouth daily.   traZODone 50 MG tablet Commonly known as: DESYREL Take 100 mg by mouth at bedtime.          REVIEW OF SYSTEMS: A comprehensive ROS was conducted with the patient and is negative except as per HPI and below:  ROS     OBJECTIVE:  VS: There were no vitals taken for this visit.   Wt Readings from Last 3 Encounters:  09/22/21 179 lb 1.6 oz (81.2 kg)  08/24/21 180 lb 12.8 oz (82 kg)  07/27/21 183 lb (83 kg)     EXAM: General: [name] appears well and is in NAD  Eyes: External eye exam normal without stare, lid lag or exophthalmos.  EOM intact.    Neck: General: Supple without adenopathy. Thyroid: Thyroid size normal.  No goiter or nodules appreciated.   Lungs: Clear with good BS bilat with no rales, rhonchi, or wheezes  Heart: Auscultation: RRR.  Abdomen: Normoactive bowel sounds, soft, nontender, without masses or organomegaly palpable  Extremities:  BL LE: No pretibial edema normal ROM and strength.  Mental Status: Judgment, insight: Intact Orientation: Oriented to time, place, and person Mood and affect: No depression, anxiety, or agitation     DATA REVIEWED: 04/20/2021  TSH 1.26 uIU/mL    Old records , labs and images have been reviewed.   ASSESSMENT/PLAN/RECOMMENDATIONS:    Hypothyroidism:  - [name] with chronic fatigue and anxiety, this is most likely explained by fibromyalgia and positive ANA  - No local neck symptoms - Anti-TPO antibodies are NORMAL  - Discussed with the [name] the importance of monitoring TSH while on LT-4 replacement and that T4 and T3 lose validity  once [name] is on levothyroxine  - TSH has been normal in the past on current dose  - [name] to return to [facility] for further thyroid management. -- [name] educated extensively on the correct way to take levothyroxine (first thing in the morning with water, 30 minutes before eating or taking other medications). - [name] encouraged to double dose the following day if [name] were to miss a dose given long half-life  of levothyroxine. - Had TFT's 2 weeks ago at [facility] office, [name] signed a ROI   Medications : Continue Levothyroxine 100 mcg daily     No further endocrinology follow up needed     Signed electronically NF:AOZH Raelyn Mora, M/D  [facility] Endocrinology  [facility] [address] [place], [place] [address] Phone: [phone] FAX: [phone]   CC: Myrlene Broker, MD [address] [place] [address] Phone: [phone] Fax: [phone]   Return to Endocrinology clinic as below: Future Appointments  Date Time Provider Department Center  11/01/2021  7:50 AM Hasson Gaspard, Konrad Dolores, MD [facility] None  11/23/2021  3:20 PM Kozlow, Alvira Philips, MD [facility] None

## 2021-11-12 ENCOUNTER — Telehealth: Payer: Self-pay | Admitting: Cardiology

## 2021-11-12 NOTE — Telephone Encounter (Signed)
Returned call to patient, she states that she began taking Nexlizet 3 weeks ago and noticed severe itching and a mild rash on her arms. She says she is pretty sensitive to having reactions to meds. She states that it also flared her tendinitis, she states she was also working in the yard and she turned bright red. She endorses more itching in her eyes and face, eyes began to swell a little last night. This morning her eyes are puffy and itchy. She also endorses congestion. Advised patient to discontinue meds and take a benadryl for discomfort and if symptoms not resolved by Monday to call PCP as low suspicion related to Nexlizet.

## 2021-11-12 NOTE — Telephone Encounter (Signed)
Spoke with [name] and she said they would call the patient right back

## 2021-11-12 NOTE — Telephone Encounter (Signed)
Pt c/o medication issue:  1. Name of Medication: Bempedoic Acid-Ezetimibe (NEXLIZET) 180-10 MG TABS  2. How are you currently taking this medication (dosage and times per day)? 1 tablet daily in evening  3. Are you having a reaction (difficulty breathing--STAT)? A little bit, but does not sound SOB  4. What is your medication issue? Patient states the medication has caused itching and breakouts. She says she has had some diarrhea. She states the itching has been increasing and now her eyes are swollen and numb. She states it hurts putting on mascara. She says this past weekend her face got red and her tendinitis flared up the first week she took it.

## 2021-11-16 NOTE — Telephone Encounter (Signed)
Agree with stopping nexlizet. If this improves symptoms then this would support drug reaction. If not improved, would have her see PCP to determine what else might be causing symptoms.

## 2021-11-16 NOTE — Telephone Encounter (Signed)
Left message to call back  

## 2021-11-21 ENCOUNTER — Encounter (HOSPITAL_BASED_OUTPATIENT_CLINIC_OR_DEPARTMENT_OTHER): Payer: Self-pay

## 2021-11-21 DIAGNOSIS — E782 Mixed hyperlipidemia: Secondary | ICD-10-CM

## 2021-11-22 MED ORDER — ROSUVASTATIN CALCIUM 10 MG PO TABS
10.0000 mg | ORAL_TABLET | Freq: Every day | ORAL | 3 refills | Status: DC
Start: 2021-11-22 — End: 2022-11-22

## 2021-11-22 NOTE — Telephone Encounter (Signed)
Responded to patient in separate encounter.

## 2021-11-22 NOTE — Telephone Encounter (Signed)
Agree ot remain off Nexletol. Okay to refill Crestor. Would recommend referral to lipid clinic.   Loel Dubonnet, NP

## 2021-11-23 ENCOUNTER — Ambulatory Visit: Payer: BC Managed Care – PPO | Admitting: Allergy and Immunology

## 2021-11-23 ENCOUNTER — Telehealth: Payer: Self-pay | Admitting: Allergy and Immunology

## 2021-11-23 VITALS — BP 122/64 | HR 90 | Temp 98.4°F | Resp 18 | Ht 67.0 in | Wt 183.8 lb

## 2021-11-23 DIAGNOSIS — T50905A Adverse effect of unspecified drugs, medicaments and biological substances, initial encounter: Secondary | ICD-10-CM

## 2021-11-23 DIAGNOSIS — K219 Gastro-esophageal reflux disease without esophagitis: Secondary | ICD-10-CM | POA: Diagnosis not present

## 2021-11-23 DIAGNOSIS — J3089 Other allergic rhinitis: Secondary | ICD-10-CM

## 2021-11-23 DIAGNOSIS — L989 Disorder of the skin and subcutaneous tissue, unspecified: Secondary | ICD-10-CM | POA: Diagnosis not present

## 2021-11-23 DIAGNOSIS — H1013 Acute atopic conjunctivitis, bilateral: Secondary | ICD-10-CM

## 2021-11-23 DIAGNOSIS — Z888 Allergy status to other drugs, medicaments and biological substances status: Secondary | ICD-10-CM | POA: Diagnosis not present

## 2021-11-23 DIAGNOSIS — T63481A Toxic effect of venom of other arthropod, accidental (unintentional), initial encounter: Secondary | ICD-10-CM

## 2021-11-23 MED ORDER — METHYLPREDNISOLONE ACETATE 80 MG/ML IJ SUSP
80.0000 mg | Freq: Once | INTRAMUSCULAR | Status: AC
  Administered 2021-11-23: 80 mg via INTRAMUSCULAR

## 2021-11-23 MED ORDER — EPINEPHRINE 0.3 MG/0.3ML IJ SOAJ
INTRAMUSCULAR | 2 refills | Status: DC
Start: 2021-11-23 — End: 2022-05-10

## 2021-11-23 NOTE — Telephone Encounter (Signed)
[name] was checking out and was interested in immunotherapy.  [name] states she tested and she thought she was only positive to dog.  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  [name] wants to know if her immunotherapy can include mold.  She states she only wants to do immunotherapy if mold is included.  She is convinced she has a mold allergy even though the test was negative.  Please advise.

## 2021-11-23 NOTE — Patient Instructions (Addendum)
  1.  Continue to perform Allergen avoidance measures as best as possible - dog  2. Continue to Treat and prevent inflammation:   A.  Flonase 1-2 sprays each nostril daily  4.  Continue to Treat and prevent reflux / LPR:   A. Omeprazole 40 mg - 1 tablet in AM  B. Famotidine 40 mg - 1 tablet in PM  5. If needed:   A.  Nasal saline  B.  Cetirizine 10 mg - 1-2 times per day  C.  Auvi-Q 0.3, Benadryl, MD/ER evaluation for allergic reaction  D.  Systane eye drops multiple times per day  E.  OTC Pataday - 1 drop each eye 1 time per day  6.  Decrease immune activation:   A.  Depo-Medrol 80 IM delivered in clinic today  7. Return to clinic in 6 months or earlier if problem  8.  Obtain fall flu vaccine

## 2021-11-23 NOTE — Progress Notes (Unsigned)
[place]   Follow-up Note  Referring Provider: Hoyt Koch, * Primary Provider: Hoyt Koch, MD Date of Office Visit: 11/23/2021  Subjective:   [name] (DOB: [birth year]) is a 51 y.o. 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  I last saw her in this clinic 18 May 2021.  She has had a difficult time recently.  She has had some issues with low-grade upper respiratory tract infections but what has really bothered her recently is the fact that she has had a significant allergic reaction to an anticholesterol medicine.  She took this medication for approximately 3 weeks and she did notice some itchiness of her skin and maybe some slight redness but then she blossomed out to this pruritic issue associated with red skin across her face and trunk along with joint pain and she ended up having eyelid swelling and some difficulty swallowing.  She finally stopped this medicine but still continues to have some skin issues even though her joint issues and swallowing issues have resolved.  And as well she had an adverse reaction to the administration of Levaquin last June.  Apparently on day 2 of this Levaquin administration she developed a diffuse dermatitis that fortunately resolved after a week.  Her reflux is doing pretty well at this point in time while using omeprazole and famotidine.  She is interested in starting a course of immunotherapy as she has been having so many problems with her respiratory tract as she goes through the year.  Allergies as of 11/23/2021       Reactions   Augmentin [amoxicillin-pot Clavulanate] Swelling   Pt  has tolerated amoxicillin prior Black rings around eyes   Imuran [azathioprine]    Muscle aches, joint aches, fatigue, GI upset, itching, rash    Other    Other reaction(s): Chest Pain   Penicillins    Other reaction(s): Eye Swelling, Headache   Sulfa Antibiotics Shortness Of Breath   Other reaction(s): Unknown   Vibramycin [doxycycline]    Other reaction(s): Edema, Edema, Unknown   Doxycycline Calcium Swelling   Retained fluid   Latex Rash   Levaquin [levofloxacin] Rash   Plaquenil [hydroxychloroquine] Rash        Medication List    cetirizine 10 MG tablet Commonly known as: ZYRTEC Take 1 tablet (10 mg total) by mouth daily.   EPINEPHrine 0.3 mg/0.3 mL Soaj injection Commonly known as: Auvi-Q Use as directed for severe allergic reaction   famotidine 40 MG tablet Commonly known as: PEPCID TAKE 1 TABLET(40 MG) BY MOUTH EVERY EVENING   fluticasone 50 MCG/ACT nasal spray Commonly known as: FLONASE Place 1-2 sprays into both nostrils daily.   levothyroxine 100 MCG tablet Commonly known as: SYNTHROID Take 1 tablet (100 mcg total) by mouth daily.   naproxen sodium 220 MG tablet Commonly known as: ALEVE Take 220 mg by mouth as needed.   nystatin-triamcinolone ointment Commonly known as: MYCOLOG Apply topically.   omeprazole 40 MG capsule Commonly known as: PRILOSEC Take 40 mg by mouth as needed.   rosuvastatin 10 MG tablet Commonly known as: CRESTOR Take 1 tablet (10 mg total) by mouth daily.   traZODone 50 MG tablet Commonly known as:  DESYREL Take 100 mg by mouth at bedtime.        Past Medical History:  Diagnosis Date   Allergy    Anxiety    Complication of anesthesia    " wakes up  crying"   Depression    Eczema    GERD (gastroesophageal reflux disease)    Hyperlipidemia    Hypothyroidism    Interstitial cystitis    Ligament tear    Left elbow   Menorrhagia    Migraines    Neuromuscular disorder (HCC)    PONV (postoperative nausea and  vomiting)    likes phenergan and scopolamine patch   Tennis elbow    Tennis elbow    Thyroid disease    Trigger finger of right hand    Urticaria     Past Surgical History:  Procedure Laterality Date   carpal tunnel release Bilateral 2010   tennis elbow also done   CESAREAN SECTION     x 2   CHOLECYSTECTOMY  2003   DIAGNOSTIC LAPAROSCOPY  20002   thru belly button to check for endometriosis   DILITATION & CURRETTAGE/HYSTROSCOPY WITH NOVASURE ABLATION N/A 11/18/2019   Procedure: DILATATION & CURETTAGE/HYSTEROSCOPY WITH NOVASURE ABLATION;  Surgeon: Brien Few, MD;  Location: [facility];  Service: Gynecology;  Laterality: N/A;   ELBOW SURGERY     TRIGGER FINGER RELEASE Right    3rd and 4th digit    Review of systems negative except as noted in HPI / PMHx or noted below:  Review of Systems  Constitutional: Negative.   HENT: Negative.    Eyes: Negative.   Respiratory: Negative.    Cardiovascular: Negative.   Gastrointestinal: Negative.   Genitourinary: Negative.   Musculoskeletal: Negative.   Skin: Negative.   Neurological: Negative.   Endo/Heme/Allergies: Negative.   Psychiatric/Behavioral: Negative.       Objective:   Vitals:   11/23/21 1549  BP: 122/64  Pulse: 90  Resp: 18  Temp: 98.4 F (36.9 C)  SpO2: 97%   Height: '5\' 7"'$  (170.2 cm)  Weight: 183 lb 12.8 oz (83.4 kg)   Physical Exam Constitutional:      Appearance: She is not diaphoretic.  HENT:     Head: Normocephalic.     Right Ear: Tympanic membrane, ear canal and external ear normal.     Left Ear: Tympanic membrane, ear canal and external ear normal.     Nose: Nose normal. No mucosal edema or rhinorrhea.     Mouth/Throat:     Pharynx: Uvula midline. No oropharyngeal exudate.  Eyes:     Conjunctiva/sclera: Conjunctivae normal.  Neck:     Thyroid: No thyromegaly.     Trachea: Trachea normal. No tracheal tenderness or tracheal deviation.  Cardiovascular:     Rate and Rhythm:  Normal rate and regular rhythm.     Heart sounds: Normal heart sounds, S1 normal and S2 normal. No murmur heard. Pulmonary:     Effort: No respiratory distress.     Breath sounds: Normal breath sounds. No stridor. No wheezing or rales.  Lymphadenopathy:     Head:     Right side of head: No tonsillar adenopathy.     Left side of head: No tonsillar adenopathy.     Cervical: No cervical adenopathy.  Skin:    Findings: Rash (Diffuse facial and neck erythema with slight induration) present. No erythema.     Nails: There is no clubbing.  Neurological:     Mental Status: She is  alert.     Diagnostics: none  Assessment and Plan:   1. Perennial allergic rhinitis   2. LPRD (laryngopharyngeal reflux disease)   3. 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 Allergic conjunctivitis of both eyes   5. Adverse effect of drug, initial encounter   6. Inflammatory dermatosis    1.  Continue to perform Allergen avoidance measures as best as possible - dog  2. Continue to Treat and prevent inflammation:   A.  Flonase 1-2 sprays each nostril daily  4.  Continue to Treat and prevent reflux / LPR:   A. Omeprazole 40 mg - 1 tablet in AM  B. Famotidine 40 mg - 1 tablet in PM  5. If needed:   A.  Nasal saline  B.  Cetirizine 10 mg - 1-2 times per day  C.  Auvi-Q 0.3, Benadryl, MD/ER evaluation for allergic reaction  D.  Systane eye drops multiple times per day  E.  OTC Pataday - 1 drop each eye 1 time per day  6.  Decrease immune activation:   A.  Depo-Medrol 80 IM delivered in clinic today  7. Return to clinic in 6 months or earlier if problem  8.  Obtain fall flu vaccine  Veva has some form of immunological reaction directed against her anticholesterol medication with an inflammatory dermatosis and we will treat her with a systemic steroid as noted above to address this issue.  She is also interested in undergoing a course of immunotherapy and she will let us know whether or not she  wants to try that form of therapy now that our new office is much closer to her place of employment.  She will continue to address her atopic disease with Flonase and continue to treat LPR with omeprazole and famotidine.  Allena Katz, MD Allergy / [facility] [place]

## 2021-11-24 ENCOUNTER — Encounter: Payer: Self-pay | Admitting: Allergy and Immunology

## 2021-11-24 NOTE — Telephone Encounter (Signed)
I called the patient and left a detailed message for the patient to come in for bloodwork for [place] to check for a mold allergy.

## 2021-11-25 NOTE — Telephone Encounter (Signed)
Spoke to patient. She will be coming in tomorrow to get lab done.   Lab has been ordered.

## 2021-11-29 NOTE — Telephone Encounter (Signed)
Patient got labs drawn Friday.

## 2021-11-30 LAB — ALLERGENS W/TOTAL IGE AREA 2

## 2021-12-02 ENCOUNTER — Other Ambulatory Visit: Payer: Self-pay | Admitting: Allergy and Immunology

## 2021-12-02 DIAGNOSIS — J3089 Other allergic rhinitis: Secondary | ICD-10-CM

## 2021-12-02 DIAGNOSIS — J3081 Allergic rhinitis due to animal (cat) (dog) hair and dander: Secondary | ICD-10-CM

## 2021-12-02 NOTE — Progress Notes (Signed)
Aeroallergen Immunotherapy   Ordering Provider: Dr. Allena Katz   Patient Details  Name: [name]  MRN: [id number]  Date of Birth: [birth year]   Order one of two   Vial Label: dog   0.5 ml (Volume)  1:10 Concentration -- Dog Epithelia    0.5  ml Extract Subtotal  4.5  ml Diluent  5.0  ml Maintenance Total   Blue Vial (1:100,000): Schedule B (6 doses)  Yellow Vial (1:10,000): Schedule B (6 doses)  Green Vial (1:1,000): Schedule B (6 doses)  Red Vial (1:100): Schedule A (12 doses)   Special Instructions: 1-2 times per week

## 2021-12-02 NOTE — Progress Notes (Signed)
VIALS EXP 12-03-22

## 2021-12-14 ENCOUNTER — Telehealth: Payer: Self-pay | Admitting: Allergy and Immunology

## 2021-12-14 ENCOUNTER — Other Ambulatory Visit: Payer: Self-pay | Admitting: *Deleted

## 2021-12-14 ENCOUNTER — Ambulatory Visit (INDEPENDENT_AMBULATORY_CARE_PROVIDER_SITE_OTHER): Payer: BC Managed Care – PPO

## 2021-12-14 DIAGNOSIS — J309 Allergic rhinitis, unspecified: Secondary | ICD-10-CM

## 2021-12-14 MED ORDER — CETIRIZINE HCL 10 MG PO TABS: 10.0000 mg | ORAL_TABLET | Freq: Every day | ORAL | 5 refills | Status: DC

## 2021-12-14 MED ORDER — FAMOTIDINE 40 MG PO TABS: 40.0000 mg | ORAL_TABLET | Freq: Every day | ORAL | 5 refills | Status: DC

## 2021-12-14 MED ORDER — OMEPRAZOLE 40 MG PO CPDR: 40.0000 mg | DELAYED_RELEASE_CAPSULE | Freq: Every morning | ORAL | 5 refills | Status: DC

## 2021-12-14 MED ORDER — FLUTICASONE PROPIONATE 50 MCG/ACT NA SUSP: 1.0000 | Freq: Every day | NASAL | 5 refills | Status: DC

## 2021-12-14 NOTE — Telephone Encounter (Signed)
[name] came into the office and stated her medications weren't refilled at her last appointment.  She would like Flonase, Zyrtec, Pepcid, and Omeprazole called in to [facility] in [place] on [address].

## 2021-12-14 NOTE — Telephone Encounter (Signed)
Refills have sent in. Called patient and advised patient, patient verbalized understanding.

## 2021-12-14 NOTE — Progress Notes (Signed)
Immunotherapy   Patient Details  Name: [name] MRN: [id number] Date of Birth: April 08, 1970  12/14/2021  [name] started injections for  Dog. Patient received 0.05 of her blue vial with an expiration of 12/03/2022. Patient waited 30 minutes with no problems.  Following schedule: B  Frequency:2 times per week Epi-Pen:Epi-Pen Available  Consent signed and patient instructions given.   Herbie Drape 12/14/2021, 4:01 PM

## 2021-12-21 ENCOUNTER — Ambulatory Visit (INDEPENDENT_AMBULATORY_CARE_PROVIDER_SITE_OTHER): Payer: BC Managed Care – PPO

## 2021-12-21 DIAGNOSIS — J309 Allergic rhinitis, unspecified: Secondary | ICD-10-CM

## 2021-12-26 NOTE — Progress Notes (Unsigned)
Office Visit    Patient Name: [name] Date of Encounter: 12/26/2021  Primary Care Provider:  Myrlene Broker, MD Primary Cardiologist:  Jodelle Red, MD  Chief Complaint    ***  Past Medical History    Past Medical History:  Diagnosis Date   Allergy    Anxiety    Complication of anesthesia    " wakes up  crying"   Depression    Eczema    GERD (gastroesophageal reflux disease)    Hyperlipidemia    Hypothyroidism    Interstitial cystitis    Ligament tear    Left elbow   Menorrhagia    Migraines    Neuromuscular disorder (HCC)    PONV (postoperative nausea and vomiting)    likes phenergan and scopolamine patch   Tennis elbow    Tennis elbow    Thyroid disease    Trigger finger of right hand    Urticaria    Past Surgical History:  Procedure Laterality Date   carpal tunnel release Bilateral 2010   tennis elbow also done   CESAREAN SECTION     x 2   CHOLECYSTECTOMY  2003   DIAGNOSTIC LAPAROSCOPY  20002   thru belly button to check for endometriosis   DILITATION & CURRETTAGE/HYSTROSCOPY WITH NOVASURE ABLATION N/A 11/18/2019   Procedure: DILATATION & CURETTAGE/HYSTEROSCOPY WITH NOVASURE ABLATION;  Surgeon: Olivia Mackie, MD;  Location: [facility];  Service: Gynecology;  Laterality: N/A;   ELBOW SURGERY     TRIGGER FINGER RELEASE Right    3rd and 4th digit    Allergies  Allergies  Allergen Reactions   Augmentin [Amoxicillin-Pot Clavulanate] Swelling    Pt has tolerated amoxicillin prior Black rings around eyes   Imuran [Azathioprine]     Muscle aches, joint aches, fatigue, GI upset, itching, rash    Other     Other reaction(s): Chest Pain   Penicillins     Other reaction(s): Eye Swelling, Headache   Sulfa Antibiotics Shortness Of Breath    Other reaction(s): Unknown   Vibramycin [Doxycycline]     Other reaction(s): Edema, Edema, Unknown   Doxycycline Calcium Swelling    Retained fluid   Latex Rash    Levaquin [Levofloxacin] Rash   Plaquenil [Hydroxychloroquine] Rash    History of Present Illness    ***  Home Medications    Current Outpatient Medications  Medication Sig Dispense Refill   cetirizine (ZYRTEC) 10 MG tablet Take 1 tablet (10 mg total) by mouth daily. 30 tablet 5   EPINEPHrine (AUVI-Q) 0.3 mg/0.3 mL IJ SOAJ injection Use as directed for severe allergic reaction 2 each 2   famotidine (PEPCID) 40 MG tablet Take 1 tablet (40 mg total) by mouth at bedtime. TAKE 1 TABLET(40 MG) BY MOUTH EVERY EVENING 30 tablet 5   fluticasone (FLONASE) 50 MCG/ACT nasal spray Place 1-2 sprays into both nostrils daily. 16 g 5   levothyroxine (SYNTHROID, LEVOTHROID) 100 MCG tablet Take 1 tablet (100 mcg total) by mouth daily. 90 tablet 3   naproxen sodium (ALEVE) 220 MG tablet Take 220 mg by mouth as needed.     nystatin-triamcinolone ointment (MYCOLOG) Apply topically.     omeprazole (PRILOSEC) 40 MG capsule Take 1 capsule (40 mg total) by mouth every morning. 30 capsule 5   rosuvastatin (CRESTOR) 10 MG tablet Take 1 tablet (10 mg total) by mouth daily. 90 tablet 3   traZODone (DESYREL) 50 MG tablet Take 100 mg by mouth at bedtime.  Current Facility-Administered Medications  Medication Dose Route Frequency Provider Last Rate Last Admin   0.9 %  sodium chloride infusion  500 mL Intravenous Continuous Jenel Lucks, MD          Accessory Clinical Findings    Lab Results  Component Value Date   CREATININE 0.78 11/13/2020   BUN 6 11/13/2020   NA 138 11/13/2020   K 3.9 11/13/2020   CL 103 11/13/2020   CO2 27 11/13/2020   Lab Results  Component Value Date   ALT 78 (H) 11/13/2020   AST 64 (H) 11/13/2020   ALKPHOS 133 (H) 11/13/2020   BILITOT 0.7 11/13/2020   Lab Results  Component Value Date   CHOL 148 10/01/2021   HDL 61 10/01/2021   LDLCALC 72 10/01/2021   TRIG 78 10/01/2021   CHOLHDL 2.4 10/01/2021    Lab Results  Component Value Date   HGBA1C 5.3 11/17/2015     Assessment & Plan    No BP recorded.  {Refresh Note OR Click here to enter BP  :1}***   No problem-specific Assessment & Plan notes found for this encounter.   Phillips Hay PharmD CPP [facility] 12/26/2021, 2:50 PM

## 2021-12-27 ENCOUNTER — Ambulatory Visit: Payer: BC Managed Care – PPO | Attending: Cardiology | Admitting: Pharmacist

## 2021-12-27 ENCOUNTER — Telehealth: Payer: Self-pay | Admitting: Pharmacist

## 2021-12-27 ENCOUNTER — Other Ambulatory Visit (HOSPITAL_BASED_OUTPATIENT_CLINIC_OR_DEPARTMENT_OTHER): Payer: Self-pay | Admitting: Cardiology

## 2021-12-27 ENCOUNTER — Encounter: Payer: Self-pay | Admitting: Pharmacist

## 2021-12-27 DIAGNOSIS — R931 Abnormal findings on diagnostic imaging of heart and coronary circulation: Secondary | ICD-10-CM | POA: Diagnosis not present

## 2021-12-27 DIAGNOSIS — E7800 Pure hypercholesterolemia, unspecified: Secondary | ICD-10-CM | POA: Diagnosis not present

## 2021-12-27 DIAGNOSIS — R635 Abnormal weight gain: Secondary | ICD-10-CM

## 2021-12-27 DIAGNOSIS — I2510 Atherosclerotic heart disease of native coronary artery without angina pectoris: Secondary | ICD-10-CM

## 2021-12-27 NOTE — Progress Notes (Unsigned)
Patient ID: [name]                 DOB: [birth year]                    MRN: [id number]      HPI: [name] is a 51 y.o. female patient referred to lipid clinic by Dr Harrell Gave. PMH is significant for elevated coronary calcium score, anxiety, food intolerances, fibromyalgia, and hypothyroidism.  Patient presents today in good spirits. Has been managed on rosuvastatin '10mg'$ . Recently started on Nexlizet and she had a reaction: trouble breathing swallowing, face swelling, and rash. Discontinued and symptoms resolved.    Has never increased rosuvastatin past '10mg'$  due to history of elevated LFTs.  However patient now thinks it was because she had been drinking too many craft beers. Has stopped eating/drinking gluten and symptoms have resolved. Last liver function tests normal.  Has an allergy to latex listed but she reports it is due to tape and adhesives.  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  Is not physically active after work.  Has had weight gain since Covid and is slowly trying to lose weight. No A1c on file. Reports a family history of T2DM.   Current Medications: rosuvastatin '10mg'$   Intolerances: Nexlizet (added to allergy list)  Risk Factors:  Coronary Calcium  LDL goal: <70  Labs:TC 148, trigs 78, HDL 61, LDL 72  Coronary calcium score of 22.4. This was 92nd percentile for age-, race-, and sex-matched controls.  Past Medical History:  Diagnosis Date   Allergy    Anxiety    Complication of anesthesia    " wakes up  crying"   Depression    Eczema    GERD (gastroesophageal reflux disease)    Hyperlipidemia    Hypothyroidism    Interstitial cystitis    Ligament tear    Left elbow   Menorrhagia    Migraines    Neuromuscular disorder (HCC)    PONV (postoperative nausea and vomiting)    likes phenergan and scopolamine patch   Tennis elbow    Tennis elbow    Thyroid disease    Trigger finger of right hand    Urticaria     Current  Outpatient Medications on File Prior to Visit  Medication Sig Dispense Refill   cetirizine (ZYRTEC) 10 MG tablet Take 1 tablet (10 mg total) by mouth daily. 30 tablet 5   EPINEPHrine (AUVI-Q) 0.3 mg/0.3 mL IJ SOAJ injection Use as directed for severe allergic reaction 2 each 2   famotidine (PEPCID) 40 MG tablet Take 1 tablet (40 mg total) by mouth at bedtime. TAKE 1 TABLET(40 MG) BY MOUTH EVERY EVENING 30 tablet 5   fluticasone (FLONASE) 50 MCG/ACT nasal spray Place 1-2 sprays into both nostrils daily. 16 g 5   levothyroxine (SYNTHROID, LEVOTHROID) 100 MCG tablet Take 1 tablet (100 mcg total) by mouth daily. 90 tablet 3   naproxen sodium (ALEVE) 220 MG tablet Take 220 mg by mouth as needed.     nystatin-triamcinolone ointment (MYCOLOG) Apply topically.     omeprazole (PRILOSEC) 40 MG capsule Take 1 capsule (40 mg total) by mouth every morning. 30 capsule 5   rosuvastatin (CRESTOR) 10 MG tablet Take 1 tablet (10 mg total) by mouth daily. 90 tablet 3   traZODone (DESYREL) 50 MG tablet Take 100 mg by mouth at bedtime.     Current Facility-Administered Medications on File Prior to Visit  Medication  Dose Route Frequency Provider Last Rate Last Admin   0.9 %  sodium chloride infusion  500 mL Intravenous Continuous Daryel November, MD        Allergies  Allergen Reactions   Augmentin [Amoxicillin-Pot Clavulanate] Swelling    Pt has tolerated amoxicillin prior Black rings around eyes   Imuran [Azathioprine]     Muscle aches, joint aches, fatigue, GI upset, itching, rash    Other     Other reaction(s): Chest Pain   Penicillins     Other reaction(s): Eye Swelling, Headache   Sulfa Antibiotics Shortness Of Breath    Other reaction(s): Unknown   Vibramycin [Doxycycline]     Other reaction(s): Edema, Edema, Unknown   Doxycycline Calcium Swelling    Retained fluid   Latex Rash   Levaquin [Levofloxacin] Rash   Plaquenil [Hydroxychloroquine] Rash    Assessment/Plan:  1. Hyperlipidemia -  Patient's recent LDL 72 which is slightly above goal of <70. Discussed in detail the mechanisms of Repatha vs statins. Patient hesitant to increase rosuvastatin due to LFT elevations however she admits previous elevations may have been due to alcohol consumption. Would rather try Repatha first.  Using demo pen, educated patient on mechanism of action, storage, site selection, administration, and possible adverse effects. Despite listed latex allergy she believes pen tip will not bother her. Will complete PA and patient would like to have first injection in office. Repeat lipid panel in 2-3 months.  Will also check A1c today since I do not see one in her records. If elevated will need to adjust LDL goal.  Continue rosuvastatin '10mg'$  daily Start Repatha '140mg'$  q 2 weeks Check lipid panel in 2-3 months  Karren Cobble, PharmD, [employer], [place], [place], [place], [place], [address] Phone: [phone], Fax: [phone]

## 2021-12-27 NOTE — Telephone Encounter (Signed)
PA for Repatha submitted. Key: [id number]

## 2021-12-27 NOTE — Patient Instructions (Addendum)
It was nice meeting you today  We would like your LDL (bad cholesterol) to be less than 70  Continue your rosuvastatin '10mg'$  daily  We will start a new medication called Repatha '140mg'$  once every 2 weeks  I will complete the prior authorization for you and contact you when it is approved  We will also check your A1c today   I will contact you with your results  Karren Cobble, PharmD, [place], [place], [place], [place], [place], [address] Phone: [phone], Fax: [phone]

## 2021-12-28 ENCOUNTER — Telehealth (HOSPITAL_BASED_OUTPATIENT_CLINIC_OR_DEPARTMENT_OTHER): Payer: Self-pay

## 2021-12-28 ENCOUNTER — Encounter: Payer: Self-pay | Admitting: Pharmacist

## 2021-12-28 ENCOUNTER — Ambulatory Visit (INDEPENDENT_AMBULATORY_CARE_PROVIDER_SITE_OTHER): Payer: BC Managed Care – PPO

## 2021-12-28 DIAGNOSIS — J309 Allergic rhinitis, unspecified: Secondary | ICD-10-CM | POA: Diagnosis not present

## 2021-12-28 LAB — HEMOGLOBIN A1C
Est. average glucose Bld gHb Est-mCnc: 117 mg/dL
Hgb A1c MFr Bld: 5.7 % — ABNORMAL HIGH (ref 4.8–5.6)

## 2021-12-28 MED ORDER — REPATHA SURECLICK 140 MG/ML ~~LOC~~ SOAJ: 1.0000 mL | SUBCUTANEOUS | 3 refills | Status: DC

## 2021-12-28 NOTE — Addendum Note (Signed)
Addended by: Rollen Sox on: 12/28/2021 04:47 PM   Modules accepted: Orders

## 2021-12-28 NOTE — Telephone Encounter (Signed)
Repatha approved through 12/27/22

## 2021-12-28 NOTE — Telephone Encounter (Addendum)
Results released via mychart, follow up mychart message sent to patient.   ----- Message from Loel Dubonnet, NP sent at 12/28/2021  4:47 PM EST ----- A1c reveals prediabetes. Ensure exercising regularly (goal of 150 minutes per week) and following low carbohydrate diet. We can refer to PREP if she is interested.

## 2022-01-04 ENCOUNTER — Ambulatory Visit (INDEPENDENT_AMBULATORY_CARE_PROVIDER_SITE_OTHER): Payer: BC Managed Care – PPO | Admitting: *Deleted

## 2022-01-04 DIAGNOSIS — J309 Allergic rhinitis, unspecified: Secondary | ICD-10-CM

## 2022-01-07 ENCOUNTER — Ambulatory Visit: Payer: BC Managed Care – PPO

## 2022-01-13 ENCOUNTER — Ambulatory Visit (INDEPENDENT_AMBULATORY_CARE_PROVIDER_SITE_OTHER): Payer: BC Managed Care – PPO

## 2022-01-13 DIAGNOSIS — J309 Allergic rhinitis, unspecified: Secondary | ICD-10-CM

## 2022-01-19 ENCOUNTER — Ambulatory Visit (INDEPENDENT_AMBULATORY_CARE_PROVIDER_SITE_OTHER): Payer: BC Managed Care – PPO

## 2022-01-19 DIAGNOSIS — J309 Allergic rhinitis, unspecified: Secondary | ICD-10-CM

## 2022-01-24 ENCOUNTER — Ambulatory Visit: Payer: BC Managed Care – PPO | Admitting: Family Medicine

## 2022-01-24 ENCOUNTER — Ambulatory Visit (INDEPENDENT_AMBULATORY_CARE_PROVIDER_SITE_OTHER): Payer: BC Managed Care – PPO

## 2022-01-24 DIAGNOSIS — J309 Allergic rhinitis, unspecified: Secondary | ICD-10-CM | POA: Diagnosis not present

## 2022-01-28 ENCOUNTER — Ambulatory Visit (INDEPENDENT_AMBULATORY_CARE_PROVIDER_SITE_OTHER): Payer: BC Managed Care – PPO | Admitting: *Deleted

## 2022-01-28 DIAGNOSIS — J309 Allergic rhinitis, unspecified: Secondary | ICD-10-CM | POA: Diagnosis not present

## 2022-02-02 ENCOUNTER — Ambulatory Visit (INDEPENDENT_AMBULATORY_CARE_PROVIDER_SITE_OTHER): Payer: BC Managed Care – PPO

## 2022-02-02 DIAGNOSIS — J309 Allergic rhinitis, unspecified: Secondary | ICD-10-CM

## 2022-02-10 ENCOUNTER — Ambulatory Visit (INDEPENDENT_AMBULATORY_CARE_PROVIDER_SITE_OTHER): Payer: BC Managed Care – PPO | Admitting: Family Medicine

## 2022-02-10 ENCOUNTER — Encounter: Payer: Self-pay | Admitting: Family Medicine

## 2022-02-10 ENCOUNTER — Other Ambulatory Visit: Payer: Self-pay

## 2022-02-10 VITALS — BP 124/80 | HR 81 | Temp 98.5°F | Resp 18

## 2022-02-10 DIAGNOSIS — L989 Disorder of the skin and subcutaneous tissue, unspecified: Secondary | ICD-10-CM | POA: Diagnosis not present

## 2022-02-10 DIAGNOSIS — J3089 Other allergic rhinitis: Secondary | ICD-10-CM | POA: Diagnosis not present

## 2022-02-10 DIAGNOSIS — L509 Urticaria, unspecified: Secondary | ICD-10-CM

## 2022-02-10 DIAGNOSIS — L501 Idiopathic urticaria: Secondary | ICD-10-CM | POA: Diagnosis not present

## 2022-02-10 DIAGNOSIS — H1013 Acute atopic conjunctivitis, bilateral: Secondary | ICD-10-CM

## 2022-02-10 DIAGNOSIS — J302 Other seasonal allergic rhinitis: Secondary | ICD-10-CM

## 2022-02-10 DIAGNOSIS — K219 Gastro-esophageal reflux disease without esophagitis: Secondary | ICD-10-CM

## 2022-02-10 MED ORDER — METHYLPREDNISOLONE ACETATE 40 MG/ML IJ SUSP
40.0000 mg | Freq: Once | INTRAMUSCULAR | Status: AC
  Administered 2022-02-10: 40 mg via INTRAMUSCULAR

## 2022-02-10 NOTE — Patient Instructions (Addendum)
  1.  Continue to perform Allergen avoidance measures as best as possible - dog/mold  2. Continue to Treat and prevent inflammation:   A.  Flonase 1-2 sprays each nostril daily  4.  Continue to Treat and prevent reflux / LPR:   A. Omeprazole 40 mg - 1 tablet in AM  B. Famotidine 40 mg - 1 tablet in PM  5. If needed:   A.  Nasal saline  B.  Cetirizine 10 mg - 1-2 times per day  C.  Auvi-Q 0.3, Benadryl, MD/ER evaluation for allergic reaction  D.  Systane eye drops multiple times per day  E.  OTC Pataday - 1 drop each eye 1 time per day  6. Labs. We will call you when results are available  7.  Depo-Medrol 40 mg given in the clinic  8.  Return to clinic in 6 months or earlier if problem

## 2022-02-10 NOTE — Progress Notes (Signed)
[place] [address] Dept: [phone]  FOLLOW UP NOTE  Patient ID: [name], female    DOB: 12/30/1970  Age: 52 y.o. MRN: [id number] Date of Office Visit: 02/10/2022  Assessment  Chief Complaint: Pruritus, Nasal Congestion, Urticaria, Sinusitis, and Wheezing  HPI [name] is a 52 year old female who presents to the clinic for a follow-up visit.  She was last seen in this clinic on 11/23/2021 by Dr. Neldon Mc for evaluation of allergic rhinitis, allergic conjunctivitis, inflammatory dermatosis, and reflux.  At today's visit, she reports that she continues to experience redness occurring daily on her chest and forearms.  She reports these areas are hot, raised, and itchy.  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  She reports that she does not experience these issues on the weekend or during the summer when she is not in the classroom.  She denies concomitant cardiopulmonary or gastrointestinal symptoms with this rash.  She reports the affected areas resolve completely without leaving residual bruising or hyperpigmented skin changes.  She continues cetirizine and famotidine with little relief of symptoms.  Her last environmental allergy testing was on 03/13/2018 and was positive to dog.  She continues allergen immunotherapy directed toward dog with no large or local reactions.  She does have a labradoodle and reports that she is able to tolerate more time with her dog since beginning allergen immunotherapy. She is expressing some concerns with food, inflammation, and abdominal sensitivity. She is currently avoiding gluten and reports a decrease in her abdominal symptoms. Other than gluten, she is not avoiding any other foods. Her current medications are listed in the chart. Of note, patient with skin flare on her chest and bilateral wrists occurring about 25 minutes after skin  testing. She denies cardiopulmonary and gastrointestinal symptoms. Cetirizine 20 mg and Depo-Medrol 40 mg IM given in the clinic. Patient with moderate resolution of rash within 30 minutes.  Drug Allergies:  Allergies  Allergen Reactions   Augmentin [Amoxicillin-Pot Clavulanate] Swelling    Pt has tolerated amoxicillin prior Black rings around eyes   Imuran [Azathioprine]     Muscle aches, joint aches, fatigue, GI upset, itching, rash    Nexlizet [Bempedoic Acid-Ezetimibe] Hives and Swelling   Other     Other reaction(s): Chest Pain   Penicillins     Other reaction(s): Eye Swelling, Headache   Sulfa Antibiotics Shortness Of Breath    Other reaction(s): Unknown   Vibramycin [Doxycycline]     Other reaction(s): Edema, Edema, Unknown   Doxycycline Calcium Swelling    Retained fluid   Latex Rash   Levaquin [Levofloxacin] Rash   Plaquenil [Hydroxychloroquine] Rash    Physical Exam: BP 124/80 (BP Location: Right Arm, Patient Position: Sitting, Cuff Size: Normal)   Pulse 81   Temp 98.5 F (36.9 C) (Temporal)   Resp 18   SpO2 96%    Physical Exam Vitals reviewed.  Constitutional:      Appearance: Normal appearance.  HENT:     Head: Normocephalic and atraumatic.     Right Ear: Tympanic membrane normal.     Left Ear: Tympanic membrane normal.     Nose:     Comments: Bilateral nares normal. Pharynx normal. Ears normal. Eyes normal.    Mouth/Throat:     Pharynx: Oropharynx is clear.  Eyes:     Conjunctiva/sclera: Conjunctivae normal.  Cardiovascular:  Rate and Rhythm: Normal rate and regular rhythm.     Heart sounds: Normal heart sounds. No murmur heard. Pulmonary:     Effort: Pulmonary effort is normal.     Breath sounds: Normal breath sounds.     Comments: Lungs clear to auscultation Musculoskeletal:        General: Normal range of motion.     Cervical back: Normal range of motion and neck supple.  Skin:    General: Skin is warm and dry.  Neurological:     Mental  Status: She is alert and oriented to person, place, and time.  Psychiatric:        Mood and Affect: Mood normal.        Behavior: Behavior normal.        Thought Content: Thought content normal.        Judgment: Judgment normal.     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   Assessment and Plan: 1. Idiopathic urticaria   2. Seasonal and perennial allergic rhinitis   3. Inflammatory dermatosis   4. Allergic conjunctivitis of both eyes   5. LPRD (laryngopharyngeal reflux disease)     Meds ordered this encounter  Medications   methylPREDNISolone acetate (DEPO-MEDROL) injection 40 mg    Patient Instructions   1.  Continue to perform Allergen avoidance measures as best as possible - dog/mold  2. Continue to Treat and prevent inflammation:   A.  Flonase 1-2 sprays each nostril daily  4.  Continue to Treat and prevent reflux / LPR:   A. Omeprazole 40 mg - 1 tablet in AM  B. Famotidine 40 mg - 1 tablet in PM  5. If needed:   A.  Nasal saline  B.  Cetirizine 10 mg - 1-2 times per day  C.  Auvi-Q 0.3, Benadryl, MD/ER evaluation for allergic reaction  D.  Systane eye drops multiple times per day  E.  OTC Pataday - 1 drop each eye 1 time per day  6. Labs. We will call you when results are available  7.  Depo-Medrol 40 mg given in the clinic  8.  Return to clinic in 6 months or earlier if problem     Return in about 6 months (around 08/11/2022), or if symptoms worsen or fail to improve.    Thank you for the opportunity to care for this patient.  Please do not hesitate to contact me with questions.  Gareth Morgan, FNP [facility]

## 2022-02-11 ENCOUNTER — Encounter: Payer: Self-pay | Admitting: Family Medicine

## 2022-02-11 DIAGNOSIS — H1013 Acute atopic conjunctivitis, bilateral: Secondary | ICD-10-CM | POA: Insufficient documentation

## 2022-02-11 DIAGNOSIS — L501 Idiopathic urticaria: Secondary | ICD-10-CM | POA: Insufficient documentation

## 2022-02-11 DIAGNOSIS — J302 Other seasonal allergic rhinitis: Secondary | ICD-10-CM | POA: Insufficient documentation

## 2022-02-11 DIAGNOSIS — L989 Disorder of the skin and subcutaneous tissue, unspecified: Secondary | ICD-10-CM | POA: Insufficient documentation

## 2022-02-14 ENCOUNTER — Ambulatory Visit (INDEPENDENT_AMBULATORY_CARE_PROVIDER_SITE_OTHER): Payer: BC Managed Care – PPO

## 2022-02-14 ENCOUNTER — Other Ambulatory Visit: Payer: Self-pay | Admitting: Allergy & Immunology

## 2022-02-14 DIAGNOSIS — J309 Allergic rhinitis, unspecified: Secondary | ICD-10-CM

## 2022-02-14 DIAGNOSIS — J302 Other seasonal allergic rhinitis: Secondary | ICD-10-CM

## 2022-02-14 NOTE — Progress Notes (Signed)
Aeroallergen Immunotherapy   Ordering Provider: Dr. Salvatore Marvel   Patient Details  Name: [name]  MRN: [id number]  Date of Birth: 31-May-1970   Order 1 of 1   Vial Label: Mold   0.4 ml (Volume)  1:20 Concentration -- Cladosporium herbarum    0.4  ml Extract Subtotal  4.6  ml Diluent  5.0  ml Maintenance Total   Schedule:  B  Silver Vial (1:1,000,000):  Blue Vial (1:100,000): Schedule B (6 doses)  Yellow Vial (1:10,000): Schedule B (6 doses)  Green Vial (1:1,000): Schedule B (6 doses)  Red Vial (1:100): Schedule A (14 doses)   Special Instructions: none

## 2022-02-14 NOTE — Progress Notes (Unsigned)
IT order written for her mold allergy.

## 2022-02-14 NOTE — Progress Notes (Signed)
VIALS EXP 02-15-23

## 2022-02-15 DIAGNOSIS — J302 Other seasonal allergic rhinitis: Secondary | ICD-10-CM

## 2022-02-17 ENCOUNTER — Ambulatory Visit (INDEPENDENT_AMBULATORY_CARE_PROVIDER_SITE_OTHER): Payer: BC Managed Care – PPO

## 2022-02-17 DIAGNOSIS — J309 Allergic rhinitis, unspecified: Secondary | ICD-10-CM | POA: Diagnosis not present

## 2022-02-18 LAB — COMPREHENSIVE METABOLIC PANEL
ALT: 54 IU/L — ABNORMAL HIGH (ref 0–32)
AST: 31 IU/L (ref 0–40)
Albumin/Globulin Ratio: 2.1 (ref 1.2–2.2)
Albumin: 4.9 g/dL (ref 3.8–4.9)
Alkaline Phosphatase: 137 IU/L — ABNORMAL HIGH (ref 44–121)
BUN/Creatinine Ratio: 14 (ref 9–23)
BUN: 11 mg/dL (ref 6–24)
Bilirubin Total: 0.3 mg/dL (ref 0.0–1.2)
CO2: 27 mmol/L (ref 20–29)
Calcium: 10.2 mg/dL (ref 8.7–10.2)
Chloride: 101 mmol/L (ref 96–106)
Creatinine, Ser: 0.81 mg/dL (ref 0.57–1.00)
Globulin, Total: 2.3 g/dL (ref 1.5–4.5)
Glucose: 78 mg/dL (ref 70–99)
Potassium: 3.8 mmol/L (ref 3.5–5.2)
Sodium: 142 mmol/L (ref 134–144)
Total Protein: 7.2 g/dL (ref 6.0–8.5)
eGFR: 87 mL/min/{1.73_m2} (ref >59)

## 2022-02-18 LAB — ALPHA-GAL PANEL
Allergen Lamb IgE: <0.1 kU/L
Beef IgE: <0.1 kU/L
IgE (Immunoglobulin E), Serum: 7 IU/mL (ref 6–495)
O215-IgE Alpha-Gal: <0.1 kU/L
Pork IgE: <0.1 kU/L

## 2022-02-18 LAB — CBC WITH DIFFERENTIAL/PLATELET
Basophils Absolute: 0 10*3/uL (ref 0.0–0.2)
Basos: 0 %
EOS (ABSOLUTE): 0.1 10*3/uL (ref 0.0–0.4)
Eos: 2 %
Hematocrit: 43.1 % (ref 34.0–46.6)
Hemoglobin: 14.2 g/dL (ref 11.1–15.9)
Immature Grans (Abs): 0 10*3/uL (ref 0.0–0.1)
Immature Granulocytes: 0 %
Lymphocytes Absolute: 1.8 10*3/uL (ref 0.7–3.1)
Lymphs: 24 %
MCH: 30.1 pg (ref 26.6–33.0)
MCHC: 32.9 g/dL (ref 31.5–35.7)
MCV: 91 fL (ref 79–97)
Monocytes Absolute: 0.6 10*3/uL (ref 0.1–0.9)
Monocytes: 8 %
Neutrophils Absolute: 5.2 10*3/uL (ref 1.4–7.0)
Neutrophils: 66 %
Platelets: 313 10*3/uL (ref 150–450)
RBC: 4.72 x10E6/uL (ref 3.77–5.28)
RDW: 12.7 % (ref 11.7–15.4)
WBC: 7.8 10*3/uL (ref 3.4–10.8)

## 2022-02-18 LAB — CHRONIC URTICARIA: cu index: 12.1 — ABNORMAL HIGH (ref <10)

## 2022-02-18 LAB — C4 COMPLEMENT: Complement C4, Serum: 31 mg/dL (ref 12–38)

## 2022-02-18 LAB — TRYPTASE: Tryptase: 7.2 ug/L (ref 2.2–13.2)

## 2022-02-21 ENCOUNTER — Ambulatory Visit: Payer: BC Managed Care – PPO | Admitting: Allergy and Immunology

## 2022-02-21 NOTE — Progress Notes (Signed)
Can you please let this patient know that her chronic urticaria marker was a little elevated. Can you please find out how her skin is doing? How is she tolerating the new allergy injections? Thank you

## 2022-02-22 ENCOUNTER — Ambulatory Visit (INDEPENDENT_AMBULATORY_CARE_PROVIDER_SITE_OTHER): Payer: BC Managed Care – PPO

## 2022-02-22 DIAGNOSIS — J309 Allergic rhinitis, unspecified: Secondary | ICD-10-CM

## 2022-02-22 NOTE — Progress Notes (Signed)
Immunotherapy   Patient Details  Name: [name] MRN: [id number] Date of Birth: September 09, 1970  02/22/2022  [name] started injections for  Mold. Patient received 0.05 of her blue vial and waited 30 minutes with no problems.  Following schedule: B  Frequency:2 times per week Epi-Pen:Epi-Pen Available  Consent signed and patient instructions given.   Herbie Drape 02/22/2022, 3:42 PM

## 2022-02-25 ENCOUNTER — Ambulatory Visit (INDEPENDENT_AMBULATORY_CARE_PROVIDER_SITE_OTHER): Payer: BC Managed Care – PPO

## 2022-02-25 DIAGNOSIS — J309 Allergic rhinitis, unspecified: Secondary | ICD-10-CM | POA: Diagnosis not present

## 2022-03-01 ENCOUNTER — Ambulatory Visit (INDEPENDENT_AMBULATORY_CARE_PROVIDER_SITE_OTHER): Payer: BC Managed Care – PPO

## 2022-03-01 DIAGNOSIS — J309 Allergic rhinitis, unspecified: Secondary | ICD-10-CM

## 2022-03-01 MED ORDER — CETIRIZINE HCL 10 MG PO TABS: 10.0000 mg | ORAL_TABLET | Freq: Two times a day (BID) | ORAL | 5 refills | Status: DC | PRN

## 2022-03-07 ENCOUNTER — Ambulatory Visit: Payer: BC Managed Care – PPO

## 2022-03-09 ENCOUNTER — Ambulatory Visit (INDEPENDENT_AMBULATORY_CARE_PROVIDER_SITE_OTHER): Payer: BC Managed Care – PPO

## 2022-03-09 ENCOUNTER — Telehealth: Payer: Self-pay

## 2022-03-09 DIAGNOSIS — J309 Allergic rhinitis, unspecified: Secondary | ICD-10-CM

## 2022-03-09 MED ORDER — HYDROCORTISONE 2.5 % EX CREA: TOPICAL_CREAM | Freq: Two times a day (BID) | CUTANEOUS | 0 refills | Status: DC

## 2022-03-09 NOTE — Telephone Encounter (Signed)
Patient needed hydrocortisone for injection area

## 2022-03-14 ENCOUNTER — Ambulatory Visit (INDEPENDENT_AMBULATORY_CARE_PROVIDER_SITE_OTHER): Payer: BC Managed Care – PPO

## 2022-03-14 DIAGNOSIS — J309 Allergic rhinitis, unspecified: Secondary | ICD-10-CM

## 2022-03-17 ENCOUNTER — Ambulatory Visit (INDEPENDENT_AMBULATORY_CARE_PROVIDER_SITE_OTHER): Payer: BC Managed Care – PPO

## 2022-03-17 DIAGNOSIS — J309 Allergic rhinitis, unspecified: Secondary | ICD-10-CM

## 2022-03-23 ENCOUNTER — Ambulatory Visit (INDEPENDENT_AMBULATORY_CARE_PROVIDER_SITE_OTHER): Payer: BC Managed Care – PPO

## 2022-03-23 ENCOUNTER — Encounter: Payer: Self-pay | Admitting: Pharmacist

## 2022-03-23 DIAGNOSIS — J309 Allergic rhinitis, unspecified: Secondary | ICD-10-CM | POA: Diagnosis not present

## 2022-03-28 ENCOUNTER — Ambulatory Visit (INDEPENDENT_AMBULATORY_CARE_PROVIDER_SITE_OTHER): Payer: BC Managed Care – PPO

## 2022-03-28 DIAGNOSIS — J309 Allergic rhinitis, unspecified: Secondary | ICD-10-CM | POA: Diagnosis not present

## 2022-03-29 ENCOUNTER — Other Ambulatory Visit: Payer: Self-pay | Admitting: Pharmacist

## 2022-03-29 ENCOUNTER — Encounter: Payer: Self-pay | Admitting: Pharmacist

## 2022-03-29 DIAGNOSIS — I2510 Atherosclerotic heart disease of native coronary artery without angina pectoris: Secondary | ICD-10-CM

## 2022-03-29 DIAGNOSIS — E7800 Pure hypercholesterolemia, unspecified: Secondary | ICD-10-CM

## 2022-03-29 DIAGNOSIS — R931 Abnormal findings on diagnostic imaging of heart and coronary circulation: Secondary | ICD-10-CM

## 2022-03-29 NOTE — Progress Notes (Signed)
Patient request LFTs be drawn with lipid panel

## 2022-03-30 ENCOUNTER — Other Ambulatory Visit: Payer: Self-pay

## 2022-03-30 DIAGNOSIS — R635 Abnormal weight gain: Secondary | ICD-10-CM

## 2022-03-30 DIAGNOSIS — R931 Abnormal findings on diagnostic imaging of heart and coronary circulation: Secondary | ICD-10-CM

## 2022-03-30 DIAGNOSIS — E7800 Pure hypercholesterolemia, unspecified: Secondary | ICD-10-CM

## 2022-03-30 LAB — HEPATIC FUNCTION PANEL
ALT: 53 IU/L — ABNORMAL HIGH (ref 0–32)
AST: 28 IU/L (ref 0–40)
Albumin: 4.4 g/dL (ref 3.8–4.9)
Alkaline Phosphatase: 138 IU/L — ABNORMAL HIGH (ref 44–121)
Bilirubin Total: <0.2 mg/dL (ref 0.0–1.2)
Bilirubin, Direct: <0.1 mg/dL (ref 0.00–0.40)
Total Protein: 6.7 g/dL (ref 6.0–8.5)

## 2022-03-30 LAB — LIPID PANEL
Chol/HDL Ratio: 1.5 ratio (ref 0.0–4.4)
Cholesterol, Total: 129 mg/dL (ref 100–199)
HDL: 87 mg/dL (ref >39)
LDL Chol Calc (NIH): 28 mg/dL (ref 0–99)
Triglycerides: 69 mg/dL (ref 0–149)
VLDL Cholesterol Cal: 14 mg/dL (ref 5–40)

## 2022-03-31 ENCOUNTER — Ambulatory Visit (INDEPENDENT_AMBULATORY_CARE_PROVIDER_SITE_OTHER): Payer: BC Managed Care – PPO

## 2022-03-31 DIAGNOSIS — J309 Allergic rhinitis, unspecified: Secondary | ICD-10-CM | POA: Diagnosis not present

## 2022-04-08 ENCOUNTER — Ambulatory Visit (INDEPENDENT_AMBULATORY_CARE_PROVIDER_SITE_OTHER): Payer: BC Managed Care – PPO | Admitting: *Deleted

## 2022-04-08 DIAGNOSIS — J309 Allergic rhinitis, unspecified: Secondary | ICD-10-CM | POA: Diagnosis not present

## 2022-04-14 ENCOUNTER — Ambulatory Visit (INDEPENDENT_AMBULATORY_CARE_PROVIDER_SITE_OTHER): Payer: BC Managed Care – PPO

## 2022-04-14 DIAGNOSIS — J309 Allergic rhinitis, unspecified: Secondary | ICD-10-CM | POA: Diagnosis not present

## 2022-04-18 ENCOUNTER — Ambulatory Visit (INDEPENDENT_AMBULATORY_CARE_PROVIDER_SITE_OTHER): Payer: BC Managed Care – PPO

## 2022-04-18 DIAGNOSIS — J309 Allergic rhinitis, unspecified: Secondary | ICD-10-CM | POA: Diagnosis not present

## 2022-04-21 ENCOUNTER — Ambulatory Visit (INDEPENDENT_AMBULATORY_CARE_PROVIDER_SITE_OTHER): Payer: BC Managed Care – PPO

## 2022-04-21 DIAGNOSIS — J309 Allergic rhinitis, unspecified: Secondary | ICD-10-CM | POA: Diagnosis not present

## 2022-04-25 ENCOUNTER — Ambulatory Visit (INDEPENDENT_AMBULATORY_CARE_PROVIDER_SITE_OTHER): Payer: BC Managed Care – PPO | Admitting: *Deleted

## 2022-04-25 DIAGNOSIS — J309 Allergic rhinitis, unspecified: Secondary | ICD-10-CM

## 2022-05-09 ENCOUNTER — Ambulatory Visit (INDEPENDENT_AMBULATORY_CARE_PROVIDER_SITE_OTHER): Payer: BC Managed Care – PPO | Admitting: *Deleted

## 2022-05-09 DIAGNOSIS — J309 Allergic rhinitis, unspecified: Secondary | ICD-10-CM

## 2022-05-10 ENCOUNTER — Ambulatory Visit: Payer: BC Managed Care – PPO | Admitting: Allergy and Immunology

## 2022-05-10 VITALS — BP 110/62 | HR 87 | Temp 98.3°F | Resp 18 | Ht 66.0 in | Wt 186.5 lb

## 2022-05-10 DIAGNOSIS — L989 Disorder of the skin and subcutaneous tissue, unspecified: Secondary | ICD-10-CM

## 2022-05-10 DIAGNOSIS — K219 Gastro-esophageal reflux disease without esophagitis: Secondary | ICD-10-CM | POA: Diagnosis not present

## 2022-05-10 DIAGNOSIS — R7989 Other specified abnormal findings of blood chemistry: Secondary | ICD-10-CM

## 2022-05-10 DIAGNOSIS — L501 Idiopathic urticaria: Secondary | ICD-10-CM | POA: Diagnosis not present

## 2022-05-10 DIAGNOSIS — J3089 Other allergic rhinitis: Secondary | ICD-10-CM

## 2022-05-10 MED ORDER — OMEPRAZOLE 40 MG PO CPDR: 40.0000 mg | DELAYED_RELEASE_CAPSULE | Freq: Every morning | ORAL | 1 refills | Status: DC

## 2022-05-10 MED ORDER — FLUTICASONE PROPIONATE 50 MCG/ACT NA SUSP: 1.0000 | Freq: Every day | NASAL | 5 refills | Status: DC

## 2022-05-10 MED ORDER — EPINEPHRINE 0.3 MG/0.3ML IJ SOAJ: 0.3000 mg | INTRAMUSCULAR | 2 refills | Status: DC | PRN

## 2022-05-10 MED ORDER — EPINEPHRINE 0.3 MG/0.3ML IJ SOAJ: INTRAMUSCULAR | 2 refills | Status: DC

## 2022-05-10 MED ORDER — FAMOTIDINE 40 MG PO TABS: 40.0000 mg | ORAL_TABLET | Freq: Every evening | ORAL | 1 refills | Status: DC

## 2022-05-10 MED ORDER — CETIRIZINE HCL 10 MG PO TABS: 10.0000 mg | ORAL_TABLET | Freq: Two times a day (BID) | ORAL | 5 refills | Status: DC | PRN

## 2022-05-10 NOTE — Patient Instructions (Addendum)
  1.  Continue to perform Allergen avoidance measures as best as possible - dog / mold  2. Continue to Treat and prevent inflammation:   A.  Flonase 1-2 sprays each nostril daily  B.  Immunotherapy - dog / mold  4.  Continue to Treat and prevent reflux / LPR:   A. Omeprazole 40 mg - 1 tablet in AM  B. Famotidine 40 mg - 1 tablet in PM  5. If needed:   A.  Nasal saline  B.  Cetirizine 10 mg - 1-2 times per day  C.  Auvi-Q 0.3, Benadryl, MD/ER evaluation for allergic reaction  D.  Systane eye drops multiple times per day  E.  OTC Pataday - 1 drop each eye 1 time per day  6.  Omalizumab / Xolair ???  7. Blood for elevated LFT tests: Hep A/B/C screen, anti-mitochondrial ab, and smooth muscle/actin antibody, anti-LKM ab, GGT  8.  Return to clinic in 6 months or earlier if problem

## 2022-05-10 NOTE — Progress Notes (Unsigned)
[place]   Follow-up Note  Referring Provider: Hoyt May, * Primary Provider: Hoyt Koch, MD Date of Office Visit: 05/10/2022  Subjective:   [name] (DOB: [birth year]) is a 52 y.o. female who returns to the [facility] [place] on 05/10/2022 in re-evaluation of the following:  HPI: [name] returns to this clinic in reevaluation of allergic rhinitis, allergic conjunctivitis, inflammatory dermatosis, urticaria, and reflux.  I last saw her in this clinic 23 November 2021.  She was seen by our nurse practitioner [name] January 2024.  She is undergoing a course of immunotherapy directed against dog and mold and she thinks that this is helping regarding her airway issue and is well her skin issue.  She does not have as many hives at this point in time.  She still has some intermittent flareups.  They never leave behind scar hyperpigmentation and are not associated with any systemic or constitutional symptoms.  She has been using Zyrtec twice a day.  Her nose is really been doing very well and overall she thinks that she has had a good spring regarding her airway.  She continues to use some Flonase.  Her reflux appears to be under good control while using omeprazole and famotidine.  Allergies as of 05/10/2022       Reactions   Augmentin [amoxicillin-pot Clavulanate] Swelling   Pt has tolerated amoxicillin prior Black rings around eyes   Imuran [azathioprine]    Muscle aches, joint aches, fatigue, GI upset, itching, rash    Nexlizet [bempedoic Acid-ezetimibe] Hives, Swelling   Other    Other reaction(s): Chest Pain   Penicillins    Other reaction(s): Eye Swelling, Headache   Sulfa Antibiotics Shortness Of Breath   Other reaction(s): Unknown   Vibramycin [doxycycline]    Other reaction(s): Edema, Edema, Unknown   Doxycycline Calcium Swelling   Retained fluid   Latex Rash   Levaquin [levofloxacin]  Rash   Plaquenil [hydroxychloroquine] Rash        Medication List    cetirizine 10 MG tablet Commonly known as: ZYRTEC Take 1 tablet (10 mg total) by mouth 2 (two) times daily as needed for allergies.   EPINEPHrine 0.3 mg/0.3 mL Soaj injection Commonly known as: Auvi-Q Inject 0.3 mg into the muscle as needed for anaphylaxis.   famotidine 40 MG tablet Commonly known as: PEPCID Take 1 tablet (40 mg total) by mouth every evening. TAKE 1 TABLET(40 MG) BY MOUTH EVERY EVENING   fluticasone 50 MCG/ACT nasal spray Commonly known as: FLONASE Place 1-2 sprays into both nostrils daily.   hydrocortisone 2.5 % cream Apply topically 2 (two) times daily.   levothyroxine 100 MCG tablet Commonly known as: SYNTHROID Take 1 tablet (100 mcg total) by mouth daily.   naproxen sodium 220 MG tablet Commonly known as: ALEVE Take 220 mg by mouth as needed.   nystatin-triamcinolone ointment Commonly known as: MYCOLOG Apply topically.   omeprazole 40 MG capsule Commonly known as: PRILOSEC Take 1 capsule (40 mg total) by mouth every morning.   Repatha SureClick [id number] MG/ML Soaj Generic drug: Evolocumab Inject 140 mg into the skin every 14 (fourteen) days.   rosuvastatin 10 MG tablet Commonly known as: CRESTOR Take 1 tablet (10 mg total) by mouth daily.   traZODone 50 MG tablet Commonly known as: DESYREL Take 100 mg by mouth at bedtime.    Past Medical History:  Diagnosis Date   Allergy    Anxiety  Complication of anesthesia    " wakes up  crying"   Depression    Eczema    GERD (gastroesophageal reflux disease)    Hyperlipidemia    Hypothyroidism    Interstitial cystitis    Ligament tear    Left elbow   Menorrhagia    Migraines    Neuromuscular disorder (HCC)    PONV (postoperative nausea and vomiting)    likes phenergan and scopolamine patch   Tennis elbow    Tennis elbow    Thyroid disease    Trigger finger of right hand    Urticaria     Past Surgical History:   Procedure Laterality Date   carpal tunnel release Bilateral 2010   tennis elbow also done   CESAREAN SECTION     x 2   CHOLECYSTECTOMY  2003   DIAGNOSTIC LAPAROSCOPY  20002   thru belly button to check for endometriosis   DILITATION & CURRETTAGE/HYSTROSCOPY WITH NOVASURE ABLATION N/A 11/18/2019   Procedure: DILATATION & CURETTAGE/HYSTEROSCOPY WITH NOVASURE ABLATION;  Surgeon: Brien Few, MD;  Location: [facility];  Service: Gynecology;  Laterality: N/A;   ELBOW SURGERY     TRIGGER FINGER RELEASE Right    3rd and 4th digit    Review of systems negative except as noted in HPI / PMHx or noted below:  Review of Systems  Constitutional: Negative.   HENT: Negative.    Eyes: Negative.   Respiratory: Negative.    Cardiovascular: Negative.   Gastrointestinal: Negative.   Genitourinary: Negative.   Musculoskeletal: Negative.   Skin: Negative.   Neurological: Negative.   Endo/Heme/Allergies: Negative.   Psychiatric/Behavioral: Negative.       Objective:   Vitals:   05/10/22 1559  BP: 110/62  Pulse: 87  Resp: 18  Temp: 98.3 F (36.8 C)  SpO2: 99%   Height: 5\' 6"  (167.6 cm)  Weight: 186 lb 8 oz (84.6 kg)   Physical Exam Constitutional:      Appearance: She is not diaphoretic.  HENT:     Head: Normocephalic.     Right Ear: Tympanic membrane, ear canal and external ear normal.     Left Ear: Tympanic membrane, ear canal and external ear normal.     Nose: Nose normal. No mucosal edema or rhinorrhea.     Mouth/Throat:     Pharynx: Uvula midline. No oropharyngeal exudate.  Eyes:     Conjunctiva/sclera: Conjunctivae normal.  Neck:     Thyroid: No thyromegaly.     Trachea: Trachea normal. No tracheal tenderness or tracheal deviation.  Cardiovascular:     Rate and Rhythm: Normal rate and regular rhythm.     Heart sounds: Normal heart sounds, S1 normal and S2 normal. No murmur heard. Pulmonary:     Effort: No respiratory distress.     Breath sounds:  Normal breath sounds. No stridor. No wheezing or rales.  Lymphadenopathy:     Head:     Right side of head: No tonsillar adenopathy.     Left side of head: No tonsillar adenopathy.     Cervical: No cervical adenopathy.  Skin:    Findings: No erythema or rash.     Nails: There is no clubbing.  Neurological:     Mental Status: She is alert.     Diagnostics: Results of blood tests obtained for January 2024 identifies creatinine 0.81 Mg/DL, alkaline phosphatase 137 U/L, AST 31 U/L, ALT 54 U/L, WBC 7.8, absolute eosinophil 100, absolute lymphocyte 1800, hemoglobin 14.2, platelet  313, CU index 12.1, tryptase 7.2 UG/L, C4 31 Mg/DL, alpha-gal panel negative,  Results of blood tests obtained 30 March 2022 identifies alkaline phosphatase 138 U/L, AST 28 U/L, ALT 53 U/L  Assessment and Plan:   No diagnosis found.  Patient Instructions   1.  Continue to perform Allergen avoidance measures as best as possible - dog / mold  2. Continue to Treat and prevent inflammation:   A.  Flonase 1-2 sprays each nostril daily  B.  Immunotherapy - dog / mold  4.  Continue to Treat and prevent reflux / LPR:   A. Omeprazole 40 mg - 1 tablet in AM  B. Famotidine 40 mg - 1 tablet in PM  5. If needed:   A.  Nasal saline  B.  Cetirizine 10 mg - 1-2 times per day  C.  Auvi-Q 0.3, Benadryl, MD/ER evaluation for allergic reaction  D.  Systane eye drops multiple times per day  E.  OTC Pataday - 1 drop each eye 1 time per day  6.  Omalizumab / Xolair ???  7.  Return to clinic in 6 months or earlier if problem        Allena Katz, MD Allergy / [place]

## 2022-05-10 NOTE — Progress Notes (Signed)
Office Visit Note  Patient: [name]             Date of Birth: Feb 27, 1970           MRN: [id number]             PCP: Myrlene Broker, MD Referring: Myrlene Broker, * Visit Date: 05/24/2022 Occupation: @GUAROCC @  Subjective:  Left hip pain  History of Present Illness: [name] is a 52 y.o. female with history of positive ANA and fibromyalgia syndrome.  She returns today after her last visit in March 2023.  She states she went for dry needling for her elbows which was helpful.  She states that epicondylitis has resolved.  She developed some scar tissue from trigger finger release which is bothersome.  She continues to have Fatigue, arthralgias and myalgias.  She denies any history of oral ulcers, nasal ulcers, sicca symptoms, Raynaud's,Rashes or lymphadenopathy.  Patient was evaluated by Dr. Karl Ito at [facility] on May 05, 2022.  I reviewed the records from the office visit.  According to the notes Dr. Yates Decamp mentioned that patient symptoms are coming from fibromyalgia.  She had low titer positive ANA with no evidence of active lupus.  She had left trochanteric bursa injection at [facility] on April 27, 2022.  Patient states the relief from the trochanteric bursa injection lasted for few days and the pain has come back.  She continues to have some generalized pain and discomfort.    Activities of Daily Living:  Patient reports morning stiffness for 1 hour.   Patient Reports nocturnal pain.  Difficulty dressing/grooming: Denies Difficulty climbing stairs: Denies Difficulty getting out of chair: Denies Difficulty using hands for taps, buttons, cutlery, and/or writing: Denies  Review of Systems  Constitutional:  Positive for fatigue.  HENT:  Positive for nosebleeds. Negative for mouth sores and mouth dryness.   Eyes:  Negative for dryness.  Respiratory:  Negative for shortness of breath.   Cardiovascular:  Negative for chest pain and palpitations.   Gastrointestinal:  Positive for diarrhea. Negative for blood in stool and constipation.  Endocrine: Positive for increased urination.  Genitourinary:  Negative for involuntary urination.  Musculoskeletal:  Positive for joint pain, joint pain, muscle weakness, morning stiffness and muscle tenderness. Negative for gait problem, joint swelling, myalgias and myalgias.  Skin:  Positive for sensitivity to sunlight. Negative for color change, rash and hair loss.  Allergic/Immunologic: Positive for susceptible to infections.  Neurological:  Negative for dizziness and headaches.  Hematological:  Negative for swollen glands.  Psychiatric/Behavioral:  Negative for depressed mood and sleep disturbance. The patient is not nervous/anxious.     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   Allergic conjunctivitis of both eyes 02/11/2022   Agatston CAC score, <100 12/27/2021   Sinusitis, acute, maxillary 07/11/2021   Abdominal distension (gaseous) 07/08/2021   Elevated ALT measurement 07/08/2021   LPRD (laryngopharyngeal reflux disease) 07/08/2021   Hemorrhage of anus and rectum 07/08/2021   Irritable bowel syndrome with both constipation and diarrhea 07/08/2021   Pain in right hand 12/21/2020   Mixed connective tissue disease 10/02/2020   Primary osteoarthritis of both hands 10/02/2020   Pain in joint of right elbow 08/31/2020   Acquired trigger finger of right middle finger 08/27/2020   Neck pain 06/23/2020   Rash and other nonspecific skin eruption 05/29/2020  Pain in joint of left elbow 04/20/2020   Pain in joint of left shoulder 04/03/2020   Epicondylitis, medial humeral, right 02/25/2020   Cubital tunnel syndrome 10/09/2019   Generalized anxiety disorder 02/02/2018   Allergic urticaria 02/02/2018   Acquired hypothyroidism 02/02/2018    Elevated cholesterol 02/02/2018    Past Medical History:  Diagnosis Date   Allergy    Anxiety    Complication of anesthesia    " wakes up  crying"   Depression    Eczema    GERD (gastroesophageal reflux disease)    Hyperlipidemia    Hypothyroidism    Interstitial cystitis    Ligament tear    Left elbow   Menorrhagia    Migraines    Neuromuscular disorder    PONV (postoperative nausea and vomiting)    likes phenergan and scopolamine patch   Tennis elbow    Tennis elbow    Thyroid disease    Trigger finger of right hand    Urticaria     Family History  Problem Relation Age of Onset   Thyroid disease Mother    Melanoma Mother    Colon polyps Father    Hypertension Father    Hyperlipidemia Father    Cancer Maternal Grandmother    Diabetes Maternal Grandmother    Hypertension Maternal Grandmother    Heart disease Maternal Grandfather    Hyperlipidemia Maternal Grandfather    Hypertension Maternal Grandfather    Diabetes Paternal Grandmother    Stroke Paternal Grandmother    Diabetes Paternal Grandfather    Heart disease Paternal Grandfather    Hyperlipidemia Paternal Grandfather    Hypertension Paternal Grandfather    Colon cancer Neg Hx    Esophageal cancer Neg Hx    Stomach cancer Neg Hx    Rectal cancer Neg Hx    Past Surgical History:  Procedure Laterality Date   carpal tunnel release Bilateral 2010   tennis elbow also done   CESAREAN SECTION     x 2   CHOLECYSTECTOMY  2003   DIAGNOSTIC LAPAROSCOPY  20002   thru belly button to check for endometriosis   DILITATION & CURRETTAGE/HYSTROSCOPY WITH NOVASURE ABLATION N/A 11/18/2019   Procedure: DILATATION & CURETTAGE/HYSTEROSCOPY WITH NOVASURE ABLATION;  Surgeon: Olivia Mackie, MD;  Location: [facility];  Service: Gynecology;  Laterality: N/A;   ELBOW SURGERY     TRIGGER FINGER RELEASE Right    3rd and 4th digit   Social History   Social History Narrative   Not on file   Immunization  History  Administered Date(s) Administered   Influenza,inj,Quad PF,6+ Mos 11/17/2015, 11/18/2016, 12/08/2017, 12/14/2018   Influenza-Unspecified 12/08/2017, 12/11/2020, 12/17/2021   Moderna Sars-Covid-2 Vaccination 02/04/2020   PFIZER Comirnaty(Gray Top)Covid-19 Tri-Sucrose Vaccine 04/12/2019, 05/03/2019     Objective: Vital Signs: BP 104/65 (BP Location: Right Arm, Patient Position: Sitting, Cuff Size: Normal)   Pulse 78   Resp 12   Ht  (1.676 m)   Wt 185 lb (83.9 kg)   BMI 29.86 kg/m    Physical Exam Vitals and nursing note reviewed.  Constitutional:      Appearance: She is well-developed.  HENT:     Head: Normocephalic and atraumatic.  Eyes:     Conjunctiva/sclera: Conjunctivae normal.  Cardiovascular:     Rate and Rhythm: Normal rate and regular rhythm.     Heart sounds: Normal heart sounds.  Pulmonary:     Effort: Pulmonary effort is normal.     Breath sounds: Normal  breath sounds.  Abdominal:     General: Bowel sounds are normal.     Palpations: Abdomen is soft.  Musculoskeletal:     Cervical back: Normal range of motion.  Lymphadenopathy:     Cervical: No cervical adenopathy.  Skin:    General: Skin is warm and dry.     Capillary Refill: Capillary refill takes less than 2 seconds.  Neurological:     Mental Status: She is alert and oriented to person, place, and time.  Psychiatric:        Behavior: Behavior normal.      Musculoskeletal Exam: Cervical, thoracic and lumbar spine were in good range of motion.  Shoulder joints, elbow joints, wrist joints, MCPs PIPs and DIPs were in good range of motion with no synovitis.  Hip joints and knee joints in good range of motion.  She had tenderness over bilateral trochanteric bursa more prominent on the left side.  Knee joints and ankles were in good range of motion without any warmth swelling or effusion.  There was no tenderness or MTPs.  CDAI Exam: CDAI Score: -- Patient Global: --; Provider Global:  -- Swollen: --; Tender: -- Joint Exam 05/24/2022   No joint exam has been documented for this visit   There is currently no information documented on the homunculus. Go to the Rheumatology activity and complete the homunculus joint exam.  Investigation: No additional findings.  Imaging: No results found.  Recent Labs: Lab Results  Component Value Date   WBC 7.8 02/10/2022   HGB 14.2 02/10/2022   PLT 313 02/10/2022   NA 142 02/10/2022   K 3.8 02/10/2022   CL 101 02/10/2022   CO2 27 02/10/2022   GLUCOSE 78 02/10/2022   BUN 11 02/10/2022   CREATININE 0.81 02/10/2022   BILITOT <0.2 03/30/2022   ALKPHOS 138 (H) 03/30/2022   AST 28 03/30/2022   ALT 53 (H) 03/30/2022   PROT 6.7 03/30/2022   ALBUMIN 4.4 03/30/2022   CALCIUM 10.2 02/10/2022   GFRAA 114 08/05/2020   QFTBGOLDPLUS NEGATIVE 05/04/2020    Speciality Comments: PLQ EYE EXAM 06/23/2020 WNL PLQ- , rash, sob,  Imuran-headache, abdominal pain,flu like symptoms 09/02/20 HRCT negative for ILD,08/13/20 ECHO cardio  Procedures:  No procedures performed Allergies: Augmentin [amoxicillin-pot clavulanate], Imuran [azathioprine], Nexlizet [bempedoic acid-ezetimibe], Other, Penicillins, Sulfa antibiotics, Vibramycin [doxycycline], Doxycycline calcium, Latex, Levaquin [levofloxacin], and Plaquenil [hydroxychloroquine]   Assessment / Plan:     Visit Diagnoses: Positive ANA (antinuclear antibody) - - Positive ANA, RNP negative, dry mouth, and joint pain. MRI neck - bilateral parotid changes.  Besides positive ANA patient does not have any other symptoms.  She states that dry mouth and dry eye symptoms resolved.She denies any oral ulcers, nasal ulcers, malar rash, photosensitivity or Raynaud's phenomenon.  Patient was also evaluated by [facility] rheumatology.  They agreed that patient had positive ANA and not an active autoimmune disease.  Trigger middle finger of right hand - Surgical release scheduled on 12/03/20 with Dr. Melvyn Novas.  She  has developed some scar tissue which is bothersome to her.  Trigger ring finger of right hand  Primary osteoarthritis of both hands-she has mild pain B and DIP discomfort most likely due to underlying osteoarthritis.  Trochanteric bursitis of both hips-she continues to have pain and discomfort in bilateral trochanteric region.  Left trochanteric bursa is very painful.  She had recent cortisone injection in her left hip by [facility] rheumatology which lasted for few days.  I will refer her to physical  therapy.  Other fatigue-related to fibromyalgia.  Fibromyalgia-she continues to have generalized pain hyperalgesia and positive tender points.  Detailed counseling on fibromyalgia syndrome was provided.  I will refer her to integrative therapies.  Other medical problems listed as follows:  NCGS (non-celiac gluten sensitivity)  Generalized anxiety disorder  Hypothyroidism due to Hashimoto's thyroiditis  Allergic urticaria  Elevated cholesterol  Family history of lupus erythematosus  Family history of rheumatoid arthritis  Acquired hypothyroidism  Orders: Orders Placed This Encounter  Procedures   Ambulatory referral to Physical Therapy   No orders of the defined types were placed in this encounter.   Face-to-face time spent with patient was 30 minutes. Greater than 50% of time was spent in counseling and coordination of care.  Follow-Up Instructions: Return in about 1 year (around 05/24/2023) for UCTD, Osteoarthritis.   Pollyann Savoy, MD  Note - This record has been created using Animal nutritionist.  Chart creation errors have been sought, but may not always  have been located. Such creation errors do not reflect on  the standard of medical care.

## 2022-05-11 ENCOUNTER — Encounter: Payer: Self-pay | Admitting: Allergy and Immunology

## 2022-05-11 ENCOUNTER — Telehealth: Payer: Self-pay | Admitting: *Deleted

## 2022-05-11 NOTE — Addendum Note (Signed)
Addended by: Chip Boer R on: 05/11/2022 05:53 PM   Modules accepted: Orders

## 2022-05-11 NOTE — Telephone Encounter (Signed)
-----   Message from Jiles Prows, MD sent at 05/11/2022  7:02 AM EDT ----- I added the GGT test as well. - Blood for elevated LFT tests: Hep A/B/C screen, anti-mitochondrial ab, and smooth muscle/actin antibody, anti-LKM ab, GGT

## 2022-05-11 NOTE — Telephone Encounter (Signed)
Labs have been ordered and placed in [place] office. I called the patient and advised of additional labs, patient verbalized understanding and will come by Thursday or Friday to have labs drawn.

## 2022-05-12 ENCOUNTER — Ambulatory Visit (INDEPENDENT_AMBULATORY_CARE_PROVIDER_SITE_OTHER): Payer: BC Managed Care – PPO

## 2022-05-12 DIAGNOSIS — J309 Allergic rhinitis, unspecified: Secondary | ICD-10-CM

## 2022-05-15 LAB — HAV, HBV, HCV
HCV Ab: NONREACTIVE
Hep B Core Total Ab: NEGATIVE
Hep B Surface Ab, Qual: NONREACTIVE
Hepatitis B Surface Ag: NEGATIVE
hep A Total Ab: NEGATIVE

## 2022-05-15 LAB — MITOCHONDRIAL/SMOOTH MUSCLE AB PNL
Mitochondrial Ab: <20 Units (ref 0.0–20.0)
Smooth Muscle Ab: 11 Units (ref 0–19)

## 2022-05-15 LAB — ANTI-MICROSOMAL ANTIBODY LIVER / KIDNEY: LKM1 Ab: 1 Units (ref 0.0–20.0)

## 2022-05-15 LAB — GAMMA GT: GGT: 57 IU/L (ref 0–60)

## 2022-05-15 LAB — HCV INTERPRETATION

## 2022-05-16 ENCOUNTER — Ambulatory Visit (INDEPENDENT_AMBULATORY_CARE_PROVIDER_SITE_OTHER): Payer: BC Managed Care – PPO | Admitting: *Deleted

## 2022-05-16 DIAGNOSIS — J309 Allergic rhinitis, unspecified: Secondary | ICD-10-CM | POA: Diagnosis not present

## 2022-05-23 ENCOUNTER — Ambulatory Visit (INDEPENDENT_AMBULATORY_CARE_PROVIDER_SITE_OTHER): Payer: BC Managed Care – PPO | Admitting: *Deleted

## 2022-05-23 DIAGNOSIS — J309 Allergic rhinitis, unspecified: Secondary | ICD-10-CM

## 2022-05-24 ENCOUNTER — Ambulatory Visit: Payer: BC Managed Care – PPO | Attending: Rheumatology | Admitting: Rheumatology

## 2022-05-24 ENCOUNTER — Encounter: Payer: Self-pay | Admitting: Rheumatology

## 2022-05-24 VITALS — BP 104/65 | HR 78 | Resp 12 | Ht 66.0 in | Wt 185.0 lb

## 2022-05-24 DIAGNOSIS — M7712 Lateral epicondylitis, left elbow: Secondary | ICD-10-CM

## 2022-05-24 DIAGNOSIS — M7061 Trochanteric bursitis, right hip: Secondary | ICD-10-CM

## 2022-05-24 DIAGNOSIS — R682 Dry mouth, unspecified: Secondary | ICD-10-CM | POA: Diagnosis not present

## 2022-05-24 DIAGNOSIS — M65331 Trigger finger, right middle finger: Secondary | ICD-10-CM

## 2022-05-24 DIAGNOSIS — L500 Allergic urticaria: Secondary | ICD-10-CM

## 2022-05-24 DIAGNOSIS — R5383 Other fatigue: Secondary | ICD-10-CM

## 2022-05-24 DIAGNOSIS — E063 Autoimmune thyroiditis: Secondary | ICD-10-CM

## 2022-05-24 DIAGNOSIS — R768 Other specified abnormal immunological findings in serum: Secondary | ICD-10-CM

## 2022-05-24 DIAGNOSIS — M19042 Primary osteoarthritis, left hand: Secondary | ICD-10-CM

## 2022-05-24 DIAGNOSIS — F411 Generalized anxiety disorder: Secondary | ICD-10-CM

## 2022-05-24 DIAGNOSIS — M797 Fibromyalgia: Secondary | ICD-10-CM

## 2022-05-24 DIAGNOSIS — M19041 Primary osteoarthritis, right hand: Secondary | ICD-10-CM

## 2022-05-24 DIAGNOSIS — E039 Hypothyroidism, unspecified: Secondary | ICD-10-CM

## 2022-05-24 DIAGNOSIS — Z79899 Other long term (current) drug therapy: Secondary | ICD-10-CM

## 2022-05-24 DIAGNOSIS — K9041 Non-celiac gluten sensitivity: Secondary | ICD-10-CM

## 2022-05-24 DIAGNOSIS — Z8261 Family history of arthritis: Secondary | ICD-10-CM

## 2022-05-24 DIAGNOSIS — M65341 Trigger finger, right ring finger: Secondary | ICD-10-CM

## 2022-05-24 DIAGNOSIS — Z840 Family history of diseases of the skin and subcutaneous tissue: Secondary | ICD-10-CM

## 2022-05-24 DIAGNOSIS — M7062 Trochanteric bursitis, left hip: Secondary | ICD-10-CM

## 2022-05-24 DIAGNOSIS — E7800 Pure hypercholesterolemia, unspecified: Secondary | ICD-10-CM

## 2022-05-24 DIAGNOSIS — M7918 Myalgia, other site: Secondary | ICD-10-CM

## 2022-05-24 DIAGNOSIS — M359 Systemic involvement of connective tissue, unspecified: Secondary | ICD-10-CM

## 2022-05-24 DIAGNOSIS — E038 Other specified hypothyroidism: Secondary | ICD-10-CM

## 2022-05-24 NOTE — Patient Instructions (Signed)
Iliotibial Band Syndrome Rehab Ask your health care provider which exercises are safe for you. Do exercises exactly as told by your health care provider and adjust them as directed. It is normal to feel mild stretching, pulling, tightness, or discomfort as you do these exercises. Stop right away if you feel sudden pain or your pain gets significantly worse. Do not begin these exercises until told by your health care provider. Stretching and range-of-motion exercises These exercises warm up your muscles and joints and improve the movement and flexibility of your hip and pelvis. Quadriceps stretch, prone  Lie on your abdomen (prone position) on a firm surface, such as a bed or padded floor. Bend your left / right knee and reach back to hold your ankle or pant leg. If you cannot reach your ankle or pant leg, loop a belt around your foot and grab the belt instead. Gently pull your heel toward your buttocks. Your knee should not slide out to the side. You should feel a stretch in the front of your thigh and knee (quadriceps). Hold this position for __________ seconds. Repeat __________ times. Complete this exercise __________ times a day. Iliotibial band stretch An iliotibial band is a strong band of muscle tissue that runs from the outer side of your hip to the outer side of your thigh and knee. Lie on your side with your left / right leg in the top position. Bend both of your knees and grab your left / right ankle. Stretch out your bottom arm to help you balance. Slowly bring your top knee back so your thigh goes behind your trunk. Slowly lower your top leg toward the floor until you feel a gentle stretch on the outside of your left / right hip and thigh. If you do not feel a stretch and your knee will not fall farther, place the heel of your other foot on top of your knee and pull your knee down toward the floor with your foot. Hold this position for __________ seconds. Repeat __________ times.  Complete this exercise __________ times a day. Strengthening exercises These exercises build strength and endurance in your hip and pelvis. Endurance is the ability to use your muscles for a long time, even after they get tired. Straight leg raises, side-lying This exercise strengthens the muscles that rotate the leg at the hip and move it away from your body (hip abductors). Lie on your side with your left / right leg in the top position. Lie so your head, shoulder, hip, and knee line up. You may bend your bottom knee to help you balance. Roll your hips slightly forward so your hips are stacked directly over each other and your left / right knee is facing forward. Tense the muscles in your outer thigh and lift your top leg 4-6 inches (10-15 cm). Hold this position for __________ seconds. Slowly lower your leg to return to the starting position. Let your muscles relax completely before doing another repetition. Repeat __________ times. Complete this exercise __________ times a day. Leg raises, prone This exercise strengthens the muscles that move the hips backward (hip extensors). Lie on your abdomen (prone position) on your bed or a firm surface. You can put a pillow under your hips if that is more comfortable for your lower back. Bend your left / right knee so your foot is straight up in the air. Squeeze your buttocks muscles and lift your left / right thigh off the bed. Do not let your back arch. Tense   your thigh muscle as hard as you can without increasing any knee pain. Hold this position for __________ seconds. Slowly lower your leg to return to the starting position and allow it to relax completely. Repeat __________ times. Complete this exercise __________ times a day. Hip hike Stand sideways on a bottom step. Stand on your left / right leg with your other foot unsupported next to the step. You can hold on to a railing or wall for balance if needed. Keep your knees straight and your  torso square. Then lift your left / right hip up toward the ceiling. Slowly let your left / right hip lower toward the floor, past the starting position. Your foot should get closer to the floor. Do not lean or bend your knees. Repeat __________ times. Complete this exercise __________ times a day. This information is not intended to replace advice given to you by your health care provider. Make sure you discuss any questions you have with your health care provider. Document Revised: 04/03/2019 Document Reviewed: 04/03/2019 Elsevier Patient Education  2023 [employer]  

## 2022-05-30 ENCOUNTER — Ambulatory Visit (INDEPENDENT_AMBULATORY_CARE_PROVIDER_SITE_OTHER): Payer: BC Managed Care – PPO

## 2022-05-30 DIAGNOSIS — J309 Allergic rhinitis, unspecified: Secondary | ICD-10-CM | POA: Diagnosis not present

## 2022-06-07 ENCOUNTER — Ambulatory Visit (INDEPENDENT_AMBULATORY_CARE_PROVIDER_SITE_OTHER): Payer: BC Managed Care – PPO

## 2022-06-07 DIAGNOSIS — J309 Allergic rhinitis, unspecified: Secondary | ICD-10-CM | POA: Diagnosis not present

## 2022-06-16 ENCOUNTER — Ambulatory Visit (INDEPENDENT_AMBULATORY_CARE_PROVIDER_SITE_OTHER): Payer: BC Managed Care – PPO

## 2022-06-16 DIAGNOSIS — J309 Allergic rhinitis, unspecified: Secondary | ICD-10-CM

## 2022-06-20 ENCOUNTER — Ambulatory Visit (INDEPENDENT_AMBULATORY_CARE_PROVIDER_SITE_OTHER): Payer: BC Managed Care – PPO | Admitting: *Deleted

## 2022-06-20 DIAGNOSIS — J309 Allergic rhinitis, unspecified: Secondary | ICD-10-CM | POA: Diagnosis not present

## 2022-06-21 DIAGNOSIS — J3081 Allergic rhinitis due to animal (cat) (dog) hair and dander: Secondary | ICD-10-CM | POA: Diagnosis not present

## 2022-06-21 NOTE — Progress Notes (Signed)
VIALS EXP 06-21-23 

## 2022-06-21 NOTE — Progress Notes (Signed)
ONLY DOG VIAL NEEDED.

## 2022-06-27 ENCOUNTER — Encounter: Payer: Self-pay | Admitting: Internal Medicine

## 2022-06-27 ENCOUNTER — Ambulatory Visit: Payer: BC Managed Care – PPO | Admitting: Internal Medicine

## 2022-06-27 VITALS — BP 122/72 | HR 81 | Temp 98.6°F | Ht 66.0 in | Wt 181.4 lb

## 2022-06-27 DIAGNOSIS — F411 Generalized anxiety disorder: Secondary | ICD-10-CM

## 2022-06-27 DIAGNOSIS — E7800 Pure hypercholesterolemia, unspecified: Secondary | ICD-10-CM

## 2022-06-27 DIAGNOSIS — E663 Overweight: Secondary | ICD-10-CM

## 2022-06-27 DIAGNOSIS — E039 Hypothyroidism, unspecified: Secondary | ICD-10-CM | POA: Diagnosis not present

## 2022-06-27 DIAGNOSIS — L500 Allergic urticaria: Secondary | ICD-10-CM

## 2022-06-27 DIAGNOSIS — Z0001 Encounter for general adult medical examination with abnormal findings: Secondary | ICD-10-CM | POA: Diagnosis not present

## 2022-06-27 DIAGNOSIS — I8393 Asymptomatic varicose veins of bilateral lower extremities: Secondary | ICD-10-CM

## 2022-06-27 DIAGNOSIS — R7303 Prediabetes: Secondary | ICD-10-CM | POA: Diagnosis not present

## 2022-06-27 MED ORDER — SEMAGLUTIDE-WEIGHT MANAGEMENT 0.5 MG/0.5ML ~~LOC~~ SOAJ: 0.5000 mg | SUBCUTANEOUS | 0 refills | Status: AC

## 2022-06-27 MED ORDER — SEMAGLUTIDE-WEIGHT MANAGEMENT 0.25 MG/0.5ML ~~LOC~~ SOAJ: 0.2500 mg | SUBCUTANEOUS | 0 refills | Status: AC

## 2022-06-27 MED ORDER — SEMAGLUTIDE-WEIGHT MANAGEMENT 1.7 MG/0.75ML ~~LOC~~ SOAJ: 1.7000 mg | SUBCUTANEOUS | 0 refills | Status: AC

## 2022-06-27 MED ORDER — SEMAGLUTIDE-WEIGHT MANAGEMENT 2.4 MG/0.75ML ~~LOC~~ SOAJ: 2.4000 mg | SUBCUTANEOUS | 0 refills | Status: AC

## 2022-06-27 MED ORDER — SEMAGLUTIDE-WEIGHT MANAGEMENT 1 MG/0.5ML ~~LOC~~ SOAJ: 1.0000 mg | SUBCUTANEOUS | 0 refills | Status: AC

## 2022-06-27 NOTE — Progress Notes (Signed)
   Subjective:   Patient ID: [name], female    DOB: 07-24-1970, 52 y.o.   MRN: [id number]  HPI The patient is a 52 YO female coming in for physical.Has some additional concerns see A/P for details.  PMH, [facility], social history reviewed and updated  Review of Systems  Constitutional: Negative.   HENT: Negative.    Eyes: Negative.   Respiratory:  Negative for cough, chest tightness and shortness of breath.   Cardiovascular:  Negative for chest pain, palpitations and leg swelling.  Gastrointestinal:  Negative for abdominal distention, abdominal pain, constipation, diarrhea, nausea and vomiting.  Musculoskeletal: Negative.   Skin: Negative.   Neurological: Negative.   Psychiatric/Behavioral: Negative.      Objective:  Physical Exam Constitutional:      Appearance: She is well-developed.  HENT:     Head: Normocephalic and atraumatic.  Cardiovascular:     Rate and Rhythm: Normal rate and regular rhythm.  Pulmonary:     Effort: Pulmonary effort is normal. No respiratory distress.     Breath sounds: Normal breath sounds. No wheezing or rales.  Abdominal:     General: Bowel sounds are normal. There is no distension.     Palpations: Abdomen is soft.     Tenderness: There is no abdominal tenderness. There is no rebound.  Musculoskeletal:     Cervical back: Normal range of motion.  Skin:    General: Skin is warm and dry.  Neurological:     Mental Status: She is alert and oriented to person, place, and time.     Coordination: Coordination normal.     Vitals:   06/27/22 1547  BP: 122/72  Pulse: 81  Temp: 98.6 F (37 C)  TempSrc: Oral  SpO2: 97%  Weight: 181 lb 6.4 oz (82.3 kg)  Height: 5\' 6"  (1.676 m)    Assessment & Plan:

## 2022-06-27 NOTE — Patient Instructions (Addendum)
We have sent in the wegovy and if not approved we can try something different.   We will get you in with the vein center.

## 2022-06-28 ENCOUNTER — Encounter: Payer: Self-pay | Admitting: Internal Medicine

## 2022-06-28 DIAGNOSIS — E663 Overweight: Secondary | ICD-10-CM | POA: Insufficient documentation

## 2022-06-28 DIAGNOSIS — I8393 Asymptomatic varicose veins of bilateral lower extremities: Secondary | ICD-10-CM | POA: Insufficient documentation

## 2022-06-28 DIAGNOSIS — R7303 Prediabetes: Secondary | ICD-10-CM | POA: Insufficient documentation

## 2022-06-28 DIAGNOSIS — Z0001 Encounter for general adult medical examination with abnormal findings: Secondary | ICD-10-CM | POA: Insufficient documentation

## 2022-06-28 LAB — HEMOGLOBIN A1C: Hgb A1c MFr Bld: 5.8 % (ref 4.6–6.5)

## 2022-06-28 NOTE — Assessment & Plan Note (Signed)
Untreated and overall stable to mild worsening. If unable to get wegovy for weight next best option would be wellbutrin which would concurrently treat this as well.

## 2022-06-28 NOTE — Assessment & Plan Note (Signed)
She has had weight gain and this is impacting her QOL due to her fibromyalgia she has had increasing pain due to weight. Counseled about options and advised we can try wegovy which is most effective and this may not be covered. If not covered we can try wellbutrin to help or metformin (recently discovered pre-diabetes).

## 2022-06-28 NOTE — Assessment & Plan Note (Signed)
Doing allergy immunotherapy and this is helping some.

## 2022-06-28 NOTE — Assessment & Plan Note (Signed)
Flu shot yearly. Shingrix counseled due. Tetanus counseled due. Colonoscopy up to date. Mammogram up to date, pap smear up to date with gyn getting records. Counseled about sun safety and mole surveillance. Counseled about the dangers of distracted driving. Given 10 year screening recommendations.

## 2022-06-28 NOTE — Assessment & Plan Note (Signed)
Due to worsening appearance of the veins in recent years she does wish to see a vein specialist. Referral done today.

## 2022-06-28 NOTE — Assessment & Plan Note (Signed)
New diagnosis since last visit and checking HgA1c as due. Strong family history of diabetes. Needs monitoring of HgA1c every 6 months.

## 2022-06-28 NOTE — Assessment & Plan Note (Signed)
Recent levels stable on repatha and crestor 10 mg daily. Continue.

## 2022-06-28 NOTE — Assessment & Plan Note (Signed)
Recent levels at goal on synthroid 100 mcg daily. Will continue at same dosing.

## 2022-06-29 ENCOUNTER — Ambulatory Visit (INDEPENDENT_AMBULATORY_CARE_PROVIDER_SITE_OTHER): Payer: BC Managed Care – PPO

## 2022-06-29 DIAGNOSIS — J309 Allergic rhinitis, unspecified: Secondary | ICD-10-CM

## 2022-06-29 MED ORDER — BUPROPION HCL ER (XL) 150 MG PO TB24
150.0000 mg | ORAL_TABLET | Freq: Every day | ORAL | 1 refills | Status: DC
Start: 2022-06-29 — End: 2023-04-25

## 2022-07-06 ENCOUNTER — Ambulatory Visit (INDEPENDENT_AMBULATORY_CARE_PROVIDER_SITE_OTHER): Payer: BC Managed Care – PPO | Admitting: *Deleted

## 2022-07-06 DIAGNOSIS — J309 Allergic rhinitis, unspecified: Secondary | ICD-10-CM | POA: Diagnosis not present

## 2022-07-12 DIAGNOSIS — B379 Candidiasis, unspecified: Secondary | ICD-10-CM | POA: Insufficient documentation

## 2022-07-12 DIAGNOSIS — R102 Pelvic and perineal pain: Secondary | ICD-10-CM | POA: Insufficient documentation

## 2022-07-15 ENCOUNTER — Ambulatory Visit (INDEPENDENT_AMBULATORY_CARE_PROVIDER_SITE_OTHER): Payer: BC Managed Care – PPO | Admitting: *Deleted

## 2022-07-15 DIAGNOSIS — J309 Allergic rhinitis, unspecified: Secondary | ICD-10-CM | POA: Diagnosis not present

## 2022-07-20 ENCOUNTER — Ambulatory Visit (INDEPENDENT_AMBULATORY_CARE_PROVIDER_SITE_OTHER): Payer: BC Managed Care – PPO

## 2022-07-20 DIAGNOSIS — J309 Allergic rhinitis, unspecified: Secondary | ICD-10-CM | POA: Diagnosis not present

## 2022-08-03 ENCOUNTER — Ambulatory Visit (INDEPENDENT_AMBULATORY_CARE_PROVIDER_SITE_OTHER): Payer: BC Managed Care – PPO

## 2022-08-03 DIAGNOSIS — J309 Allergic rhinitis, unspecified: Secondary | ICD-10-CM | POA: Diagnosis not present

## 2022-08-09 ENCOUNTER — Ambulatory Visit (INDEPENDENT_AMBULATORY_CARE_PROVIDER_SITE_OTHER): Payer: BC Managed Care – PPO

## 2022-08-09 DIAGNOSIS — J309 Allergic rhinitis, unspecified: Secondary | ICD-10-CM | POA: Diagnosis not present

## 2022-08-12 ENCOUNTER — Other Ambulatory Visit: Payer: Self-pay | Admitting: *Deleted

## 2022-08-12 DIAGNOSIS — I8393 Asymptomatic varicose veins of bilateral lower extremities: Secondary | ICD-10-CM

## 2022-08-18 ENCOUNTER — Ambulatory Visit (INDEPENDENT_AMBULATORY_CARE_PROVIDER_SITE_OTHER): Payer: BC Managed Care – PPO

## 2022-08-18 DIAGNOSIS — J309 Allergic rhinitis, unspecified: Secondary | ICD-10-CM

## 2022-08-24 ENCOUNTER — Ambulatory Visit (INDEPENDENT_AMBULATORY_CARE_PROVIDER_SITE_OTHER): Payer: BC Managed Care – PPO | Admitting: *Deleted

## 2022-08-24 DIAGNOSIS — J309 Allergic rhinitis, unspecified: Secondary | ICD-10-CM

## 2022-08-26 ENCOUNTER — Encounter: Payer: BC Managed Care – PPO | Admitting: Vascular Surgery

## 2022-08-26 ENCOUNTER — Ambulatory Visit (HOSPITAL_COMMUNITY): Payer: BC Managed Care – PPO

## 2022-09-01 ENCOUNTER — Ambulatory Visit (INDEPENDENT_AMBULATORY_CARE_PROVIDER_SITE_OTHER): Payer: BC Managed Care – PPO

## 2022-09-01 DIAGNOSIS — J309 Allergic rhinitis, unspecified: Secondary | ICD-10-CM | POA: Diagnosis not present

## 2022-09-01 NOTE — Progress Notes (Signed)
VIAL EXP 09-01-23

## 2022-09-02 ENCOUNTER — Ambulatory Visit (HOSPITAL_COMMUNITY)
Admission: RE | Admit: 2022-09-02 | Discharge: 2022-09-02 | Disposition: A | Discharge status: Home / Self Care | Payer: BC Managed Care – PPO | Source: Ambulatory Visit | Attending: Vascular Surgery | Admitting: Vascular Surgery

## 2022-09-02 DIAGNOSIS — I8393 Asymptomatic varicose veins of bilateral lower extremities: Secondary | ICD-10-CM | POA: Insufficient documentation

## 2022-09-02 DIAGNOSIS — J302 Other seasonal allergic rhinitis: Secondary | ICD-10-CM

## 2022-09-14 ENCOUNTER — Telehealth: Payer: Self-pay | Admitting: Allergy and Immunology

## 2022-09-14 ENCOUNTER — Ambulatory Visit (INDEPENDENT_AMBULATORY_CARE_PROVIDER_SITE_OTHER): Payer: BC Managed Care – PPO

## 2022-09-14 DIAGNOSIS — J309 Allergic rhinitis, unspecified: Secondary | ICD-10-CM | POA: Diagnosis not present

## 2022-09-14 DIAGNOSIS — T781XXA Other adverse food reactions, not elsewhere classified, initial encounter: Secondary | ICD-10-CM

## 2022-09-14 NOTE — Telephone Encounter (Signed)
[name] came into the office and requested a sooner appointment.  When she came in she stated that she would like blood work to check for a corn allergy and if we are able to test for a lectin allergy she would like that as well.  Darien states when she eats corn or anything with lectin, she breaks out and itches everywhere.

## 2022-09-22 ENCOUNTER — Ambulatory Visit (HOSPITAL_BASED_OUTPATIENT_CLINIC_OR_DEPARTMENT_OTHER): Payer: BC Managed Care – PPO | Admitting: Cardiology

## 2022-09-23 ENCOUNTER — Ambulatory Visit (INDEPENDENT_AMBULATORY_CARE_PROVIDER_SITE_OTHER): Payer: BC Managed Care – PPO

## 2022-09-23 DIAGNOSIS — J309 Allergic rhinitis, unspecified: Secondary | ICD-10-CM | POA: Diagnosis not present

## 2022-09-23 NOTE — Telephone Encounter (Signed)
Called patient and let her know that the labs have been ordered and that she can come in during the times we are open to get this done. Patient states that she will come in some time next week to get it done.

## 2022-09-26 ENCOUNTER — Ambulatory Visit (HOSPITAL_BASED_OUTPATIENT_CLINIC_OR_DEPARTMENT_OTHER): Payer: BC Managed Care – PPO | Admitting: Cardiology

## 2022-09-26 ENCOUNTER — Encounter (HOSPITAL_BASED_OUTPATIENT_CLINIC_OR_DEPARTMENT_OTHER): Payer: Self-pay | Admitting: Cardiology

## 2022-09-26 VITALS — BP 120/82 | HR 87 | Ht 66.0 in | Wt 159.0 lb

## 2022-09-26 DIAGNOSIS — R931 Abnormal findings on diagnostic imaging of heart and coronary circulation: Secondary | ICD-10-CM | POA: Diagnosis not present

## 2022-09-26 DIAGNOSIS — Z713 Dietary counseling and surveillance: Secondary | ICD-10-CM | POA: Diagnosis not present

## 2022-09-26 DIAGNOSIS — E782 Mixed hyperlipidemia: Secondary | ICD-10-CM | POA: Diagnosis not present

## 2022-09-26 DIAGNOSIS — Z7189 Other specified counseling: Secondary | ICD-10-CM | POA: Diagnosis not present

## 2022-09-26 DIAGNOSIS — Z9189 Other specified personal risk factors, not elsewhere classified: Secondary | ICD-10-CM

## 2022-09-26 NOTE — Patient Instructions (Signed)

## 2022-09-26 NOTE — Progress Notes (Signed)
Cardiology Office Note:  .    Date:  09/26/2022  ID:  [name], DOB 1970-11-23, MRN [id number] PCP: Myrlene Broker, MD   [facility] Providers Cardiologist:  Jodelle Red, MD     History of Present Illness: .    [name] is a 52 y.o. female with a hx of hyperlipidemia, hypothyroidism, fibromyalgia, and GERD, who is seen for follow-up today. She was initially seen 09/2021 as a new consult at the request of Bensimhon, Bevelyn Buckles, MD for the evaluation and management of elevated cholesterol.   She was seen on 08/24/2021 by Dr. Gala Romney for established CHF. Her DLCO was down and her CT showed mild calcification of LAD. She was referred to cardiology for further risk factor modification. She was started on Crestor 10.    Cardiovascular risk factors: Comorbid conditions: hyperlipidemia Family history: Her mother has not had any issues. Prior pertinent testing and/or incidental findings: reviewed calcium score today Exercise level: She is usually very active in gardening and frequently develops a lot of swelling in her hands after over exertion. Her face also gets erythematous after some time that it takes hours to get back to normal.  Current diet: She has made many changes including cutting out alcohol and changing her diet to gluten free. She eats a lot of fresh cucumbers and oysters to cut out red meat. She has eaten only gluten free bread. She doesn't usually eat breakfast.   At her initial visit we reviewed her test results. We went into detail of her elevated calcium scores and medication recommendations. She struggled with chronic pain of her hips, back, hands, and wrists. She was also diagnosed with fibromyalgia. She didn't know what could be the cause but she was been taking Tylenol for the pain. She experienced bloating due to the inflammation as well. She has had previous issues with carpal tunnel and joint pain. She also had complications with  diarrhea and breaking out on her arms. She was around 190 lbs. 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 Continued rosuvastatin 10 mg May.   Today, she is feeling well in general. However, she reports development of autoimmune issues. She has been following with specialists at [facility].  In the middle of the past school year, she struggled with infrequent random diarrhea, cravings, abdominal bloating, and acid reflux. Her PCP had prescribed Wellbutrin XL which has helped to decrease her appetite and resolve her cravings. She has also worked on significant dietary changes, including trying to eliminate wheat and corn products, staying away from sugar and popcorn, reducing beef to 2 times per week, and switching to almond milk and coconut yogurt. At this time, her GI issues have improved and she now only experiences heart burn with greasy foods, deli style sandwich, etc. She states that she has also implemented the plant paradox diet.  Does get some swelling in her bilateral legs, but she monitors at home for fluid retention. Participating in PT, and has routine hot stone massages which have helped her bursitis.  She remains on Repatha. Initially suffered bruising from hip injections but has since switched to abdominal sites.  Complains of feeling forgetful at times; she will discuss this with her RA specialist in October.   She denies any palpitations, chest pain, shortness of breath, lightheadedness, headaches, syncope, orthopnea, or PND.  ROS:  Please see the history of present illness. ROS otherwise negative  except as noted.  (+) Intermittent acid reflux (+) Bilateral LE edema  Studies Reviewed: Marland Kitchen    EKG Interpretation Date/Time:  Monday September 26 2022 16:28:46 EDT Ventricular Rate:  87 PR Interval:  138 QRS Duration:  80 QT Interval:  386 QTC Calculation: 464 R Axis:   72  Text Interpretation: Normal sinus rhythm  low anterior forces When compared with ECG of 24-Aug-2020 14:57, No significant change was found Confirmed by Jodelle Red [phone]) on 09/26/2022 4:46:15 PM     Physical Exam:    VS:  BP 120/82 (BP Location: Left Arm, Patient Position: Sitting, Cuff Size: Normal)   Pulse 87   Ht 5\' 6"  (1.676 m)   Wt 159 lb (72.1 kg)   BMI 25.66 kg/m    Wt Readings from Last 3 Encounters:  09/26/22 159 lb (72.1 kg)  06/27/22 181 lb 6.4 oz (82.3 kg)  05/24/22 185 lb (83.9 kg)    GEN: Well nourished, well developed in no acute distress HEENT: Normal, moist mucous membranes NECK: No JVD CARDIAC: regular rhythm, normal S1 and S2, no rubs or gallops. No murmur. VASCULAR: Radial and DP pulses 2+ bilaterally. No carotid bruits RESPIRATORY:  Clear to auscultation without rales, wheezing or rhonchi  ABDOMEN: Soft, non-tender, non-distended MUSCULOSKELETAL:  Ambulates independently SKIN: Warm and dry, no edema NEUROLOGIC:  Alert and oriented x 3. No focal neuro deficits noted. PSYCHIATRIC:  Normal affect   ASSESSMENT AND PLAN: .    Coronary calcium Mixed hyperlipidemia -CAC score 22.4 -she is tolerating rosuvastatin 10 mg May, continue. Did not get to goal on this, did not tolerate nexletol or zetia, tolerating repatha -lipids excellent -reviewed red flag warning signs that need immediate medical attention  Discussed diets at length today  At risk for prolonged QT: on trazodone and wellbutrin. ECG stable.   Cardiac risk counseling and prevention recommendations: -recommend heart healthy/Mediterranean diet, with whole grains, fruits, vegetable, fish, lean meats, nuts, and olive oil. Limit salt. -recommend moderate walking, 3-5 times/week for 30-50 minutes each session. Aim for at least 150 minutes.week. Goal should be pace of 3 miles/hours, or walking 1.5 miles in 30 minutes -recommend avoidance of tobacco products. Avoid excess alcohol. -ASCVD risk score: The ASCVD Risk score (Arnett DK,  et al., 2019) failed to calculate for the following reasons:   The valid total cholesterol range is 130 to 320 mg/dL   Dispo: Follow-up in 1 year, or sooner as needed.  Total time of encounter: 43 minutes total time of encounter, including 39 minutes spent in face-to-face patient care. This time includes coordination of care and counseling regarding above conditions. Remainder of non-face-to-face time involved reviewing chart documents/testing relevant to the patient encounter and documentation in the medical record.  Jodelle Red, MD, PhD, [facility]  [facility]    I,Mathew Stumpf,acting as a scribe for Jodelle Red, MD.,have documented all relevant documentation on the behalf of Jodelle Red, MD,as directed by  Jodelle Red, MD while in the presence of Jodelle Red, MD.  I, Jodelle Red, MD, have reviewed all documentation for this visit. The documentation on 09/26/22 for the exam, diagnosis, procedures, and orders are all accurate and complete.   Signed, Jodelle Red, MD

## 2022-09-27 ENCOUNTER — Ambulatory Visit (HOSPITAL_BASED_OUTPATIENT_CLINIC_OR_DEPARTMENT_OTHER): Payer: BC Managed Care – PPO | Admitting: Cardiology

## 2022-09-30 ENCOUNTER — Ambulatory Visit (INDEPENDENT_AMBULATORY_CARE_PROVIDER_SITE_OTHER): Payer: BC Managed Care – PPO | Admitting: *Deleted

## 2022-09-30 DIAGNOSIS — J309 Allergic rhinitis, unspecified: Secondary | ICD-10-CM | POA: Diagnosis not present

## 2022-10-05 LAB — ALLERGEN PROFILE, FOOD-LEGUME
Allergen Lentil IgE: <0.1 kU/L
Peanut IgE: <0.1 kU/L
Soybean IgE: <0.1 kU/L

## 2022-10-05 LAB — ALLERGEN, CORN F8: Allergen Corn, IgE: <0.1 kU/L

## 2022-10-11 ENCOUNTER — Telehealth: Payer: Self-pay

## 2022-10-11 ENCOUNTER — Ambulatory Visit (INDEPENDENT_AMBULATORY_CARE_PROVIDER_SITE_OTHER): Payer: BC Managed Care – PPO

## 2022-10-11 DIAGNOSIS — J309 Allergic rhinitis, unspecified: Secondary | ICD-10-CM

## 2022-10-11 NOTE — Telephone Encounter (Signed)
Patient came in to get her allergy injections and was asking about her lab results. It looks like the labs came back however have not been resulted. Please advise.

## 2022-10-13 NOTE — Telephone Encounter (Signed)
Patient has been contacted and informed of lab results.

## 2022-10-16 ENCOUNTER — Encounter: Payer: Self-pay | Admitting: Internal Medicine

## 2022-10-17 NOTE — Progress Notes (Signed)
VIAL EXP 10-17-23

## 2022-10-18 DIAGNOSIS — J3081 Allergic rhinitis due to animal (cat) (dog) hair and dander: Secondary | ICD-10-CM | POA: Diagnosis not present

## 2022-10-25 ENCOUNTER — Other Ambulatory Visit: Payer: Self-pay

## 2022-10-25 ENCOUNTER — Ambulatory Visit: Payer: BC Managed Care – PPO | Admitting: Allergy and Immunology

## 2022-10-25 ENCOUNTER — Ambulatory Visit: Payer: Self-pay | Admitting: *Deleted

## 2022-10-25 ENCOUNTER — Encounter: Payer: Self-pay | Admitting: Allergy and Immunology

## 2022-10-25 VITALS — BP 112/60 | HR 79 | Temp 98.1°F | Resp 16

## 2022-10-25 DIAGNOSIS — L989 Disorder of the skin and subcutaneous tissue, unspecified: Secondary | ICD-10-CM

## 2022-10-25 DIAGNOSIS — L501 Idiopathic urticaria: Secondary | ICD-10-CM

## 2022-10-25 DIAGNOSIS — K219 Gastro-esophageal reflux disease without esophagitis: Secondary | ICD-10-CM

## 2022-10-25 DIAGNOSIS — J3089 Other allergic rhinitis: Secondary | ICD-10-CM

## 2022-10-25 DIAGNOSIS — J309 Allergic rhinitis, unspecified: Secondary | ICD-10-CM

## 2022-10-25 DIAGNOSIS — T781XXD Other adverse food reactions, not elsewhere classified, subsequent encounter: Secondary | ICD-10-CM | POA: Diagnosis not present

## 2022-10-25 DIAGNOSIS — T781XXA Other adverse food reactions, not elsewhere classified, initial encounter: Secondary | ICD-10-CM

## 2022-10-25 MED ORDER — OMEPRAZOLE 40 MG PO CPDR: 40.0000 mg | DELAYED_RELEASE_CAPSULE | Freq: Every morning | ORAL | 1 refills | Status: DC

## 2022-10-25 MED ORDER — FAMOTIDINE 40 MG PO TABS: 40.0000 mg | ORAL_TABLET | Freq: Every evening | ORAL | 1 refills | Status: DC

## 2022-10-25 NOTE — Patient Instructions (Addendum)
  1.  Continue to perform Allergen avoidance measures as best as possible - dog / mold  2. Avoid grain consumption  3. Continue to Treat and prevent inflammation:   A.  Flonase 1-2 sprays each nostril daily  B.  Immunotherapy - dog / mold  4.  Continue to Treat and prevent reflux / LPR:   A. Famotidine 40 mg - 1 tablet in PM  5. If needed:   A.  Nasal saline  B.  Cetirizine 10 mg - 1-2 times per day  C.  Auvi-Q 0.3, Benadryl, MD/ER evaluation for allergic reaction  D.  Systane eye drops multiple times per day  E.  OTC Pataday - 1 drop each eye 1 time per day  6.  Plan for fall flu vaccine  7.  Return to clinic in 6 months or earlier if problem

## 2022-10-25 NOTE — Progress Notes (Unsigned)
[place]   Follow-up Note  Referring Provider: Myrlene Broker, * Primary Provider: Myrlene Broker, MD Date of Office Visit: 10/25/2022  Subjective:   [name] (DOB: 09/16/1970) is a 52 y.o. female who [facility] in re-evaluation of the following:  HPI: Magdline returns to this clinic in evaluation of allergic rhinitis, allergic conjunctivitis, inflammatory dermatosis, urticaria, history of elevated liver function tests, and reflux.  I last saw her in this clinic 10 May 2022.  Since I have seen her in this clinic she has had some significant lifestyle changes.  She stopped eating all grains and legumes because she was developing issues with stomach upset and occasionally some rashes.  This has resulted in significant improvement regarding her skin and significant improvement regarding her got and she has lost 30 to weight while also using Wellbutrin.    Her reflux has improved significantly and she does not use omeprazole anymore and relies on the use of famotidine.  She continues on immunotherapy for her airway disease which is under excellent control.  During her last visit she also had some evidence of elevated liver function tests and we obtained follow-up blood test which fortunately were all normal.  She did have a tooth abscess that required a root canal a few weeks back and during that episode she feels that this flared her fibromyalgia pretty significantly and she is already had some improvement now that she is moving away from that event.  Allergies as of 10/25/2022       Reactions   Augmentin [amoxicillin-pot Clavulanate] Swelling   Pt has tolerated amoxicillin prior Black rings around eyes   Imuran [azathioprine]    Muscle aches, joint aches, fatigue, GI upset, itching, rash    Nexlizet [bempedoic Acid-ezetimibe] Hives, Swelling   Other    Other  reaction(s): Chest Pain   Penicillins    Other reaction(s): Eye Swelling, Headache   Sulfa Antibiotics Shortness Of Breath   Other reaction(s): Unknown   Vibramycin [doxycycline]    Other reaction(s): Edema, Edema, Unknown   Doxycycline Calcium Swelling   Retained fluid   Latex Rash   Levaquin [levofloxacin] Rash   Plaquenil [hydroxychloroquine] Rash        Medication List    buPROPion 150 MG 24 hr tablet Commonly known as: Wellbutrin XL Take 1 tablet (150 mg total) by mouth daily.   cetirizine 10 MG tablet Commonly known as: ZYRTEC Take 1 tablet (10 mg total) by mouth 2 (two) times daily as needed for allergies.   Ellura 200 MG Caps Generic drug: Cranberry Take 1 capsule by mouth daily.   EPINEPHrine 0.3 mg/0.3 mL Soaj injection Commonly known as: Auvi-Q Inject 0.3 mg into the muscle as needed for anaphylaxis.   famotidine 40 MG tablet Commonly known as: PEPCID Take 1 tablet (40 mg total) by mouth every evening. TAKE 1 TABLET(40 MG) BY MOUTH EVERY EVENING   fluticasone 50 MCG/ACT nasal spray Commonly known as: FLONASE Place 1-2 sprays into both nostrils daily.   hydrocortisone 2.5 % cream Apply topically 2 (two) times daily.   levothyroxine 100 MCG tablet Commonly known as: SYNTHROID Take 1 tablet (100 mcg total) by mouth daily.   naproxen sodium 220 MG tablet Commonly known as: ALEVE Take 220 mg by mouth as needed.   nystatin-triamcinolone ointment Commonly known as: MYCOLOG Apply topically.   omeprazole 40 MG capsule Commonly known as: PRILOSEC  Take 1 capsule (40 mg total) by mouth every morning.   Repatha SureClick 140 MG/ML Soaj Generic drug: Evolocumab Inject 140 mg into the skin every 14 (fourteen) days.   rosuvastatin 10 MG tablet Commonly known as: CRESTOR Take 1 tablet (10 mg total) by mouth daily.   Semaglutide-Weight Management 2.4 MG/0.75ML Soaj Inject 2.4 mg into the skin once a week for 28 days.   traZODone 50 MG tablet Commonly  known as: DESYREL Take 100 mg by mouth at bedtime.   triamcinolone ointment 0.1 % Commonly known as: KENALOG Apply topically.    Past Medical History:  Diagnosis Date   Allergy    Anxiety    Complication of anesthesia    " wakes up  crying"   Depression    Eczema    GERD (gastroesophageal reflux disease)    Hyperlipidemia    Hypothyroidism    Interstitial cystitis    Ligament tear    Left elbow   Menorrhagia    Migraines    Neuromuscular disorder (HCC)    PONV (postoperative nausea and vomiting)    likes phenergan and scopolamine patch   Tennis elbow    Tennis elbow    Thyroid disease    Trigger finger of right hand    Urticaria     Past Surgical History:  Procedure Laterality Date   carpal tunnel release Bilateral 2010   tennis elbow also done   CESAREAN SECTION     x 2   CHOLECYSTECTOMY  2003   DIAGNOSTIC LAPAROSCOPY  20002   thru belly button to check for endometriosis   DILITATION & CURRETTAGE/HYSTROSCOPY WITH NOVASURE ABLATION N/A 11/18/2019   Procedure: DILATATION & CURETTAGE/HYSTEROSCOPY WITH NOVASURE ABLATION;  Surgeon: Olivia Mackie, MD;  Location: [facility];  Service: Gynecology;  Laterality: N/A;   ELBOW SURGERY     TRIGGER FINGER RELEASE Right    3rd and 4th digit    Review of systems negative except as noted in HPI / PMHx or noted below:  Review of Systems  Constitutional: Negative.   HENT: Negative.    Eyes: Negative.   Respiratory: Negative.    Cardiovascular: Negative.   Gastrointestinal: Negative.   Genitourinary: Negative.   Musculoskeletal: Negative.   Skin: Negative.   Neurological: Negative.   Endo/Heme/Allergies: Negative.   Psychiatric/Behavioral: Negative.       Objective:   Vitals:   10/25/22 1604  BP: 112/60  Pulse: 79  Resp: 16  Temp: 98.1 F (36.7 C)  SpO2: 97%          Physical Exam Constitutional:      Appearance: She is not diaphoretic.  HENT:     Head: Normocephalic.     Right  Ear: Tympanic membrane, ear canal and external ear normal.     Left Ear: Tympanic membrane, ear canal and external ear normal.     Nose: Nose normal. No mucosal edema or rhinorrhea.     Mouth/Throat:     Pharynx: Uvula midline. No oropharyngeal exudate.  Eyes:     Conjunctiva/sclera: Conjunctivae normal.  Neck:     Thyroid: No thyromegaly.     Trachea: Trachea normal. No tracheal tenderness or tracheal deviation.  Cardiovascular:     Rate and Rhythm: Normal rate and regular rhythm.     Heart sounds: Normal heart sounds, S1 normal and S2 normal. No murmur heard. Pulmonary:     Effort: No respiratory distress.     Breath sounds: Normal breath sounds. No stridor. No  wheezing or rales.  Lymphadenopathy:     Head:     Right side of head: No tonsillar adenopathy.     Left side of head: No tonsillar adenopathy.     Cervical: No cervical adenopathy.  Skin:    Findings: No erythema or rash.     Nails: There is no clubbing.  Neurological:     Mental Status: She is alert.     Diagnostics: none  Assessment and Plan:   1. Perennial allergic rhinitis   2. Idiopathic urticaria   3. Inflammatory dermatosis   4. Adverse food reaction, initial encounter   5. LPRD (laryngopharyngeal reflux disease)    1.  Continue to perform Allergen avoidance measures as best as possible - dog / mold  2. Avoid grain consumption  3. Continue to Treat and prevent inflammation:   A.  Flonase 1-2 sprays each nostril daily  B.  Immunotherapy - dog / mold  4.  Continue to Treat and prevent reflux / LPR:   A. Famotidine 40 mg - 1 tablet in PM  5. If needed:   A.  Nasal saline  B.  Cetirizine 10 mg - 1-2 times per day  C.  Auvi-Q 0.3, Benadryl, MD/ER evaluation for allergic reaction  D.  Systane eye drops multiple times per day  E.  OTC Pataday - 1 drop each eye 1 time per day  6.  Plan for fall flu vaccine  7.  Return to clinic in 6 months or earlier if problem  Lachrisha is really doing so much  better at this point in time since she has eliminated the consumption of grains and legumes.  She has been able to consolidate a lot of her therapy and overall feels very good at this point in time about her current health status.  She will continue on immunotherapy to address her atopic disease.  She has a selection of agents to be utilized should they be required.  Will see her back in this clinic in 6 months or earlier if there is a problem.    Laurette Schimke, MD Allergy / Immunology [facility]

## 2022-10-26 ENCOUNTER — Encounter: Payer: Self-pay | Admitting: Allergy and Immunology

## 2022-11-01 ENCOUNTER — Encounter: Payer: Self-pay | Admitting: Internal Medicine

## 2022-11-01 ENCOUNTER — Ambulatory Visit: Payer: BC Managed Care – PPO | Admitting: Internal Medicine

## 2022-11-01 VITALS — BP 124/84 | HR 84 | Temp 98.5°F | Ht 66.0 in | Wt 152.0 lb

## 2022-11-01 DIAGNOSIS — E039 Hypothyroidism, unspecified: Secondary | ICD-10-CM

## 2022-11-01 DIAGNOSIS — F411 Generalized anxiety disorder: Secondary | ICD-10-CM

## 2022-11-01 DIAGNOSIS — R202 Paresthesia of skin: Secondary | ICD-10-CM | POA: Diagnosis not present

## 2022-11-01 DIAGNOSIS — L659 Nonscarring hair loss, unspecified: Secondary | ICD-10-CM

## 2022-11-01 DIAGNOSIS — Z23 Encounter for immunization: Secondary | ICD-10-CM | POA: Diagnosis not present

## 2022-11-01 DIAGNOSIS — E663 Overweight: Secondary | ICD-10-CM

## 2022-11-01 NOTE — Progress Notes (Unsigned)
Subjective:   Patient ID: [name], female    DOB: August 01, 1970, 52 y.o.   MRN: [id number]  HPI The patient is a 52 YO female coming in for hair thinning and concentration and fatigue.  Review of Systems  Constitutional:  Positive for fatigue.  HENT: Negative.         Hair thinning  Eyes: Negative.   Respiratory:  Negative for cough, chest tightness and shortness of breath.   Cardiovascular:  Negative for chest pain, palpitations and leg swelling.  Gastrointestinal:  Negative for abdominal distention, abdominal pain, constipation, diarrhea, nausea and vomiting.  Musculoskeletal: Negative.   Skin: Negative.   Neurological: Negative.   Psychiatric/Behavioral:  Positive for decreased concentration.     Objective:  Physical Exam Constitutional:      Appearance: She is well-developed.  HENT:     Head: Normocephalic and atraumatic.  Cardiovascular:     Rate and Rhythm: Normal rate and regular rhythm.  Pulmonary:     Effort: Pulmonary effort is normal. No respiratory distress.     Breath sounds: Normal breath sounds. No wheezing or rales.  Abdominal:     General: Bowel sounds are normal. There is no distension.     Palpations: Abdomen is soft.     Tenderness: There is no abdominal tenderness. There is no rebound.  Musculoskeletal:     Cervical back: Normal range of motion.  Skin:    General: Skin is warm and dry.  Neurological:     Mental Status: She is alert and oriented to person, place, and time.     Coordination: Coordination normal.     Vitals:   11/01/22 1521  BP: 124/84  Pulse: 84  Temp: 98.5 F (36.9 C)  TempSrc: Oral  SpO2: 97%  Weight: 152 lb (68.9 kg)  Height: 5\' 6"  (1.676 m)   Flu shot given at visit Assessment & Plan:  Visit time 25 minutes in face to face communication with patient and coordination of care, additional 7 minutes spent in record review, coordination or care, ordering tests, communicating/referring to other healthcare  professionals, documenting in medical records all on the same day of the visit for total time 32 minutes spent on the visit.

## 2022-11-01 NOTE — Patient Instructions (Signed)
We will check the labs today if all normal we can try a cream for the neck and ears.

## 2022-11-02 ENCOUNTER — Encounter: Payer: Self-pay | Admitting: Internal Medicine

## 2022-11-02 ENCOUNTER — Ambulatory Visit (HOSPITAL_BASED_OUTPATIENT_CLINIC_OR_DEPARTMENT_OTHER): Payer: BC Managed Care – PPO | Admitting: Cardiology

## 2022-11-02 DIAGNOSIS — L659 Nonscarring hair loss, unspecified: Secondary | ICD-10-CM | POA: Insufficient documentation

## 2022-11-02 LAB — VITAMIN B12: Vitamin B-12: 482 pg/mL (ref 211–911)

## 2022-11-02 LAB — COMPREHENSIVE METABOLIC PANEL
ALT: 43 U/L — ABNORMAL HIGH (ref 0–35)
AST: 47 U/L — ABNORMAL HIGH (ref 0–37)
Albumin: 4.2 g/dL (ref 3.5–5.2)
Alkaline Phosphatase: 85 U/L (ref 39–117)
BUN: 15 mg/dL (ref 6–23)
CO2: 28 mEq/L (ref 19–32)
Calcium: 9 mg/dL (ref 8.4–10.5)
Chloride: 104 mEq/L (ref 96–112)
Creatinine, Ser: 1.06 mg/dL (ref 0.40–1.20)
GFR: 59.89 mL/min — ABNORMAL LOW (ref >60.00)
Glucose, Bld: 83 mg/dL (ref 70–99)
Potassium: 3.9 mEq/L (ref 3.5–5.1)
Sodium: 139 mEq/L (ref 135–145)
Total Bilirubin: 0.3 mg/dL (ref 0.2–1.2)
Total Protein: 6.6 g/dL (ref 6.0–8.3)

## 2022-11-02 LAB — VITAMIN D 25 HYDROXY (VIT D DEFICIENCY, FRACTURES): VITD: 42.66 ng/mL (ref 30.00–100.00)

## 2022-11-02 LAB — TSH: TSH: 0.89 u[IU]/mL (ref 0.35–5.50)

## 2022-11-02 NOTE — Assessment & Plan Note (Signed)
Could be related to her sudden weight loss, or wellbutrin. Checking TSH, B12 CMP, vitamin D to assess for metabolic cause. Encouraged to start multivitamin. Advised if from shock of weight loss this could take some time to resolve.

## 2022-11-02 NOTE — Assessment & Plan Note (Signed)
Checking TSH and adjust as needed. She is having more hair thinning lately.

## 2022-11-02 NOTE — Assessment & Plan Note (Signed)
She was able to start wellbutrin which helped with weight and she is stable now at normal weight. She is working on building back some muscle tone.

## 2022-11-02 NOTE — Assessment & Plan Note (Signed)
Did improve with wellbutrin but she got concentration change which was side effect and hair thinning. She has stopped wellbutrin about 2 weeks ago and the concentration is improving. She is working on Pharmacologist to help GAD.

## 2022-11-03 ENCOUNTER — Ambulatory Visit: Payer: BC Managed Care – PPO | Admitting: Physician Assistant

## 2022-11-03 VITALS — BP 130/87 | HR 78 | Temp 98.2°F | Resp 20 | Ht 66.0 in | Wt 155.0 lb

## 2022-11-03 DIAGNOSIS — I8393 Asymptomatic varicose veins of bilateral lower extremities: Secondary | ICD-10-CM | POA: Diagnosis not present

## 2022-11-03 NOTE — Progress Notes (Signed)
[facility]           OF [place]  History and Physical   [name] is a 52 y.o. female who presents with achiness of legs with right worse than left.   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  She does have some mild swelling around her ankles.  She has never had hx of DVT.  She does not have any skin color changes on her legs.  She cannot tolerate compression until winter time as she works in an old building without air conditioning and it is not tolerable.  In the winter, she does wear some tighter tights and compression pants and does notice this helps with her legs.  She does stand on concrete during her work day.    She does have family hx of varicose veins and leg swelling.  She has had about a 30 lb intentional weight loss and states that she does feel better.  She does have some spider veins that are present on her right calf and left medial leg that are painful.    She does have hx of fibromyalgia and left hip bursitis, which has added to her leg issues.     The pt is on a statin for cholesterol management.  The pt is not on a daily aspirin.   Other AC:  none The pt is not on medication for hypertension.   The pt is not on medication for diabetes.   Tobacco hx:  never  Pt does not have family hx of AAA.  Past Medical History:  Diagnosis Date   Allergy    Anxiety    Complication of anesthesia    " wakes up  crying"   Depression    Eczema    GERD (gastroesophageal reflux disease)    Hyperlipidemia    Hypothyroidism    Interstitial cystitis    Ligament tear    Left elbow   Menorrhagia    Migraines    Neuromuscular disorder (HCC)    PONV (postoperative nausea and vomiting)    likes phenergan and scopolamine patch   Tennis elbow    Tennis elbow    Thyroid disease    Trigger finger of right hand    Urticaria     Past Surgical History:  Procedure  Laterality Date   carpal tunnel release Bilateral 2010   tennis elbow also done   CESAREAN SECTION     x 2   CHOLECYSTECTOMY  2003   DIAGNOSTIC LAPAROSCOPY  20002   thru belly button to check for endometriosis   DILITATION & CURRETTAGE/HYSTROSCOPY WITH NOVASURE ABLATION N/A 11/18/2019   Procedure: DILATATION & CURETTAGE/HYSTEROSCOPY WITH NOVASURE ABLATION;  Surgeon: Olivia Mackie, MD;  Location: [facility];  Service: Gynecology;  Laterality: N/A;   ELBOW SURGERY     TRIGGER FINGER RELEASE Right    3rd and 4th digit    Social History   Socioeconomic History   Marital status: Significant Other    Spouse name: Not on file   Number of children: Not on file   Years of education: Not on file   Highest education level: Bachelor's degree (e.g., BA, AB, BS)  Occupational History   Not on file  Tobacco Use   Smoking status: Never    Passive exposure: Current   Smokeless tobacco:  Never  Vaping Use   Vaping status: Never Used  Substance and Sexual Activity   Alcohol use: Yes    Alcohol/week: 2.0 standard drinks of alcohol    Types: 2 Glasses of wine per week    Comment: a few times weekly   Drug use: No   Sexual activity: Not on file  Other Topics Concern   Not on file  Social History Narrative   Not on file   Social Determinants of Health   Financial Resource Strain: Low Risk  (06/26/2022)   Overall Financial Resource Strain (CARDIA)    Difficulty of Paying Living Expenses: Not hard at all  Food Insecurity: Low Risk  (09/14/2022)   Received from [facility]   Hunger Vital Sign    Worried About Running Out of Food in the Last Year: Never true    Ran Out of Food in the Last Year: Never true  Transportation Needs: Not on file (09/14/2022)  Physical Activity: Insufficiently Active (06/26/2022)   Exercise Vital Sign    Days of Exercise per Week: 3 days    Minutes of Exercise per Session: 40 min  Stress: Stress Concern Present (06/26/2022)   [employer] of  Occupational Health - Occupational Stress Questionnaire    Feeling of Stress : To some extent  Social Connections: Unknown (06/26/2022)   Social Connection and Isolation Panel [NHANES]    Frequency of Communication with Friends and Family: More than three times a week    Frequency of Social Gatherings with Friends and Family: Three times a week    Attends Religious Services: Patient declined    Active Member of Clubs or Organizations: Yes    Attends Engineer, structural: More than 4 times per year    Marital Status: Living with partner  Intimate Partner Violence: Not on file     Family History  Problem Relation Age of Onset   Thyroid disease Mother    Melanoma Mother    Colon polyps Father    Hypertension Father    Hyperlipidemia Father    Cancer Maternal Grandmother    Diabetes Maternal Grandmother    Hypertension Maternal Grandmother    Heart disease Maternal Grandfather    Hyperlipidemia Maternal Grandfather    Hypertension Maternal Grandfather    Diabetes Paternal Grandmother    Stroke Paternal Grandmother    Diabetes Paternal Grandfather    Heart disease Paternal Grandfather    Hyperlipidemia Paternal Grandfather    Hypertension Paternal Grandfather    Colon cancer Neg Hx    Esophageal cancer Neg Hx    Stomach cancer Neg Hx    Rectal cancer Neg Hx     Current Outpatient Medications  Medication Sig Dispense Refill   cetirizine (ZYRTEC) 10 MG tablet Take 1 tablet (10 mg total) by mouth 2 (two) times daily as needed for allergies. 60 tablet 5   Cranberry (ELLURA) 200 MG CAPS Take 1 capsule by mouth daily.     EPINEPHrine (AUVI-Q) 0.3 mg/0.3 mL IJ SOAJ injection Inject 0.3 mg into the muscle as needed for anaphylaxis. 2 each 2   Evolocumab (REPATHA SURECLICK) 140 MG/ML SOAJ Inject 140 mg into the skin every 14 (fourteen) days. 6 mL 3   famotidine (PEPCID) 40 MG tablet Take 1 tablet (40 mg total) by mouth every evening. TAKE 1 TABLET(40 MG) BY MOUTH EVERY EVENING  90 tablet 1   fluticasone (FLONASE) 50 MCG/ACT nasal spray Place 1-2 sprays into both nostrils daily. 16 g 5  hydrocortisone 2.5 % cream Apply topically 2 (two) times daily. 30 g 0   levothyroxine (SYNTHROID, LEVOTHROID) 100 MCG tablet Take 1 tablet (100 mcg total) by mouth daily. 90 tablet 3   naproxen sodium (ALEVE) 220 MG tablet Take 220 mg by mouth as needed.     nystatin-triamcinolone ointment (MYCOLOG) Apply topically.     omeprazole (PRILOSEC) 40 MG capsule Take 1 capsule (40 mg total) by mouth every morning. 90 capsule 1   rosuvastatin (CRESTOR) 10 MG tablet Take 1 tablet (10 mg total) by mouth daily. 90 tablet 3   Semaglutide-Weight Management 2.4 MG/0.75ML SOAJ Inject 2.4 mg into the skin once a week for 28 days. 3 mL 0   traZODone (DESYREL) 50 MG tablet Take 100 mg by mouth at bedtime.     triamcinolone ointment (KENALOG) 0.1 % Apply topically.     buPROPion (WELLBUTRIN XL) 150 MG 24 hr tablet Take 1 tablet (150 mg total) by mouth daily. (Patient not taking: Reported on 11/01/2022) 90 tablet 1   No current facility-administered medications for this visit.    Allergies  Allergen Reactions   Augmentin [Amoxicillin-Pot Clavulanate] Swelling    Pt has tolerated amoxicillin prior Black rings around eyes   Imuran [Azathioprine]     Muscle aches, joint aches, fatigue, GI upset, itching, rash    Nexlizet [Bempedoic Acid-Ezetimibe] Hives and Swelling   Other     Other reaction(s): Chest Pain   Penicillins     Other reaction(s): Eye Swelling, Headache   Sulfa Antibiotics Shortness Of Breath    Other reaction(s): Unknown   Vibramycin [Doxycycline]     Other reaction(s): Edema, Edema, Unknown   Doxycycline Calcium Swelling    Retained fluid   Latex Rash   Levaquin [Levofloxacin] Rash   Plaquenil [Hydroxychloroquine] Rash    REVIEW OF SYSTEMS:   [X]  denotes positive finding, [ ]  denotes negative finding Cardiac  Comments:  Chest pain or chest pressure:    Shortness of breath  upon exertion:    Short of breath when lying flat:    Irregular heart rhythm:        Vascular    Pain in calf, thigh, or hip brought on by ambulation:    Pain in feet at night that wakes you up from your sleep:     Blood clot in your veins:    Leg swelling:  x Achiness as above      Pulmonary    Oxygen at home:    Productive cough:     Wheezing:         Neurologic    Sudden weakness in arms or legs:     Sudden numbness in arms or legs:     Sudden onset of difficulty speaking or slurred speech:    Temporary loss of vision in one eye:     Problems with dizziness:         Gastrointestinal    Blood in stool:     Vomited blood:         Genitourinary    Burning when urinating:     Blood in urine:        Psychiatric    Major depression:         Hematologic    Bleeding problems:    Problems with blood clotting too easily:        Skin    Rashes or ulcers:        Constitutional    Fever or chills:  PHYSICAL EXAMINATION:  Today's Vitals   11/03/22 1520  BP: 130/87  Pulse: 78  Resp: 20  Temp: 98.2 F (36.8 C)  SpO2: 98%  Weight: 155 lb (70.3 kg)  Height: 5\' 6"  (1.676 m)  PainSc: 3    Body mass index is 25.02 kg/m.   General:  WDWN in NAD; vital signs documented above Gait: Not observed HENT: WNL, normocephalic Pulmonary: normal non-labored breathing without wheezing Cardiac: regular HR; without carotid bruits Abdomen: soft, NT, aortic pulse is not palpable Skin: without rashes Vascular Exam/Pulses:  Right Left  Radial 2+ (normal) 2+ (normal)  DP 2+ (normal) 2+ (normal)   Extremities: mild pitting edema from socks around ankles bilaterally; spider veins present as pictured.    Right calf   Left medial leg    Neurologic: A&O X 3;  moving all extremities equally Psychiatric:  The pt has Normal affect.   Non-Invasive Vascular Imaging:   Venous duplex on 11/03/2022: Venous Reflux Times   +--------------+---------+------+-----------+------------+--------+  RIGHT        Reflux NoRefluxReflux TimeDiameter cmsComments                          Yes                                   +--------------+---------+------+-----------+------------+--------+  CFV                    yes   >1 second                       +--------------+---------+------+-----------+------------+--------+  FV mid        no                                              +--------------+---------+------+-----------+------------+--------+  Popliteal    no                                              +--------------+---------+------+-----------+------------+--------+  GSV at SFJ                     >500 ms      0.54              +--------------+---------+------+-----------+------------+--------+  GSV prox thighno                            0.44              +--------------+---------+------+-----------+------------+--------+  GSV mid thigh           yes    >500 ms      0.23              +--------------+---------+------+-----------+------------+--------+  GSV dist thigh          yes    >500 ms      0.23              +--------------+---------+------+-----------+------------+--------+  GSV at knee             yes    >500 ms  0.28              +--------------+---------+------+-----------+------------+--------+  GSV prox calf           yes    >500 ms      0.17              +--------------+---------+------+-----------+------------+--------+  GSV mid calf  no                            0.14              +--------------+---------+------+-----------+------------+--------+  SSV Pop Fossa no                            0.26              +--------------+---------+------+-----------+------------+--------+  SSV prox calf no                            0.20               +--------------+---------+------+-----------+------------+--------+  SSV mid calf            yes    >500 ms      0.26              +--------------+---------+------+-----------+------------+--------+  AASV O        no                            0.27              +--------------+---------+------+-----------+------------+--------+  AASV P        no                            0.16              +--------------+---------+------+-----------+------------+--------+  AASV M        no                                              +--------------+---------+------+-----------+------------+--------+   Summary:  Right:  - No evidence of deep vein thrombosis seen in the right lower extremity,  from the common femoral through the popliteal veins.  - Venous reflux is noted in the right common femoral vein.  - Venous reflux is noted in the right sapheno-femoral junction.  - Venous reflux is noted in the right greater saphenous vein in the thigh.  - Venous reflux is noted in the right greater saphenous vein in the calf.  - Venous reflux is noted in the right short saphenous vein.  - No reflux in the right AASV     Kaavya Sovey is a 52 y.o. female who presents with: achiness and heaviness of BLE with right worse than left    -pt has easily palpable DP pedal pulses bilaterally -pt does not have evidence of DVT.  Pt does have venous reflux in the deep right CFV as well as the GSV at the SFJ and in the thigh and calf, however, the size of the vein is not amendable to laser ablation -discussed with pt about wearing thigh high 20-30 mmHg compression stockings as she cannot tolerate the knee  high due to the swelling above the sock and pt was measured for these today.    -discussed the importance of leg elevation and how to elevate properly - pt is advised to elevate their legs and a diagram is given to them to demonstrate for pt to lay flat on their back with knees elevated and  slightly bent with their feet higher than their knees, which puts their feet higher than their heart for 15 minutes per day.  If pt cannot lay flat, advised to lay as flat as possible.  -pt is advised to continue as much walking as possible and avoid sitting or standing for long periods of time.  -discussed importance of weight loss and exercise and that water aerobics would also be beneficial.  She has lost 30 lbs intentionally, which is great.   -handout with recommendations given -pt will f/u as needed -Harriett Sine, RN will touch base with pt to discuss sclerotherapy    Doreatha Massed, [facility] [phone]  Clinic MD:  Lenell Antu on call MD

## 2022-11-04 ENCOUNTER — Telehealth: Payer: Self-pay

## 2022-11-04 NOTE — Telephone Encounter (Signed)
Spoke to pt regarding sclerotherapy. She had several questions and is wanting to wait for the weather to cool off before scheduling. 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 She will call back to schedule once she is ready. We discussed 2 main areas of spider veins using 1-2 syringes.

## 2022-11-07 ENCOUNTER — Ambulatory Visit (INDEPENDENT_AMBULATORY_CARE_PROVIDER_SITE_OTHER): Payer: BC Managed Care – PPO | Admitting: *Deleted

## 2022-11-07 DIAGNOSIS — J309 Allergic rhinitis, unspecified: Secondary | ICD-10-CM | POA: Diagnosis not present

## 2022-11-08 ENCOUNTER — Ambulatory Visit: Payer: BC Managed Care – PPO | Admitting: Allergy and Immunology

## 2022-11-09 DIAGNOSIS — F32A Depression, unspecified: Secondary | ICD-10-CM | POA: Insufficient documentation

## 2022-11-09 DIAGNOSIS — R87610 Atypical squamous cells of undetermined significance on cytologic smear of cervix (ASC-US): Secondary | ICD-10-CM | POA: Insufficient documentation

## 2022-11-09 DIAGNOSIS — B977 Papillomavirus as the cause of diseases classified elsewhere: Secondary | ICD-10-CM | POA: Insufficient documentation

## 2022-11-09 DIAGNOSIS — F419 Anxiety disorder, unspecified: Secondary | ICD-10-CM | POA: Insufficient documentation

## 2022-11-09 DIAGNOSIS — Z9889 Other specified postprocedural states: Secondary | ICD-10-CM | POA: Insufficient documentation

## 2022-11-09 DIAGNOSIS — R87612 Low grade squamous intraepithelial lesion on cytologic smear of cervix (LGSIL): Secondary | ICD-10-CM | POA: Insufficient documentation

## 2022-11-09 DIAGNOSIS — N979 Female infertility, unspecified: Secondary | ICD-10-CM | POA: Insufficient documentation

## 2022-11-09 DIAGNOSIS — D239 Other benign neoplasm of skin, unspecified: Secondary | ICD-10-CM | POA: Insufficient documentation

## 2022-11-09 DIAGNOSIS — G4700 Insomnia, unspecified: Secondary | ICD-10-CM | POA: Insufficient documentation

## 2022-11-17 LAB — HM MAMMOGRAPHY

## 2022-11-22 ENCOUNTER — Other Ambulatory Visit (HOSPITAL_BASED_OUTPATIENT_CLINIC_OR_DEPARTMENT_OTHER): Payer: Self-pay | Admitting: Family

## 2022-11-29 ENCOUNTER — Ambulatory Visit (INDEPENDENT_AMBULATORY_CARE_PROVIDER_SITE_OTHER): Payer: BC Managed Care – PPO | Admitting: *Deleted

## 2022-11-29 DIAGNOSIS — J309 Allergic rhinitis, unspecified: Secondary | ICD-10-CM

## 2022-11-30 ENCOUNTER — Other Ambulatory Visit: Payer: Self-pay | Admitting: Pharmacist

## 2022-11-30 DIAGNOSIS — R931 Abnormal findings on diagnostic imaging of heart and coronary circulation: Secondary | ICD-10-CM

## 2022-11-30 DIAGNOSIS — E7800 Pure hypercholesterolemia, unspecified: Secondary | ICD-10-CM

## 2022-11-30 DIAGNOSIS — I2510 Atherosclerotic heart disease of native coronary artery without angina pectoris: Secondary | ICD-10-CM

## 2022-11-30 MED ORDER — REPATHA SURECLICK 140 MG/ML ~~LOC~~ SOAJ: 1.0000 mL | SUBCUTANEOUS | 1 refills | Status: DC

## 2022-12-07 ENCOUNTER — Ambulatory Visit (INDEPENDENT_AMBULATORY_CARE_PROVIDER_SITE_OTHER): Payer: BC Managed Care – PPO | Admitting: *Deleted

## 2022-12-07 DIAGNOSIS — J309 Allergic rhinitis, unspecified: Secondary | ICD-10-CM

## 2022-12-10 ENCOUNTER — Other Ambulatory Visit: Payer: Self-pay | Admitting: Allergy and Immunology

## 2022-12-27 ENCOUNTER — Ambulatory Visit (INDEPENDENT_AMBULATORY_CARE_PROVIDER_SITE_OTHER): Payer: Self-pay | Admitting: *Deleted

## 2022-12-27 DIAGNOSIS — J309 Allergic rhinitis, unspecified: Secondary | ICD-10-CM | POA: Diagnosis not present

## 2023-01-10 ENCOUNTER — Ambulatory Visit (INDEPENDENT_AMBULATORY_CARE_PROVIDER_SITE_OTHER): Payer: BC Managed Care – PPO | Admitting: *Deleted

## 2023-01-10 DIAGNOSIS — J309 Allergic rhinitis, unspecified: Secondary | ICD-10-CM

## 2023-01-10 MED ORDER — HYDROCORTISONE 2.5 % EX CREA: TOPICAL_CREAM | Freq: Two times a day (BID) | CUTANEOUS | 2 refills | Status: DC | PRN

## 2023-01-18 ENCOUNTER — Ambulatory Visit (INDEPENDENT_AMBULATORY_CARE_PROVIDER_SITE_OTHER): Payer: BC Managed Care – PPO

## 2023-01-18 DIAGNOSIS — J309 Allergic rhinitis, unspecified: Secondary | ICD-10-CM | POA: Diagnosis not present

## 2023-01-26 ENCOUNTER — Ambulatory Visit (INDEPENDENT_AMBULATORY_CARE_PROVIDER_SITE_OTHER): Payer: BC Managed Care – PPO

## 2023-01-26 DIAGNOSIS — J309 Allergic rhinitis, unspecified: Secondary | ICD-10-CM | POA: Diagnosis not present

## 2023-02-10 ENCOUNTER — Ambulatory Visit (INDEPENDENT_AMBULATORY_CARE_PROVIDER_SITE_OTHER): Payer: 59

## 2023-02-10 DIAGNOSIS — J309 Allergic rhinitis, unspecified: Secondary | ICD-10-CM | POA: Diagnosis not present

## 2023-02-20 ENCOUNTER — Ambulatory Visit (INDEPENDENT_AMBULATORY_CARE_PROVIDER_SITE_OTHER): Payer: 59 | Admitting: *Deleted

## 2023-02-20 ENCOUNTER — Telehealth: Payer: Self-pay | Admitting: Pharmacy Technician

## 2023-02-20 ENCOUNTER — Telehealth: Payer: Self-pay | Admitting: Cardiology

## 2023-02-20 ENCOUNTER — Other Ambulatory Visit (HOSPITAL_COMMUNITY): Payer: Self-pay

## 2023-02-20 DIAGNOSIS — J309 Allergic rhinitis, unspecified: Secondary | ICD-10-CM

## 2023-02-20 NOTE — Telephone Encounter (Signed)
 Patient called stating her insurance change in the beginning of the year and she needs a new prior auth for her Repatha.

## 2023-02-20 NOTE — Telephone Encounter (Signed)
 PA request has been Submitted. New Encounter created for follow up. For additional info see Pharmacy Prior Auth telephone encounter from 02/20/23.

## 2023-02-20 NOTE — Telephone Encounter (Addendum)
 [name], CPhT     02/20/23  1:40 PM Note Pharmacy Patient Advocate Encounter   Received notification from [facility] that Prior Authorization for Repatha  SureClick 140MG /ML auto-injectors  has been APPROVED from 02/20/23 to 02/19/24. Ran test claim, Copay is $30.00. This test claim was processed through [facility]- copay amounts may vary at other pharmacies due to pharmacy/plan contracts, or as the patient moves through the different stages of their insurance plan.    PA #/Case ID/Reference #: PA Case ID #: [id number]     Mychart message sent to patient

## 2023-02-20 NOTE — Telephone Encounter (Signed)
 Pharmacy Patient Advocate Encounter   Received notification from Pt Calls Messages that prior authorization for Repatha  SureClick 140MG /ML auto-injectors is required/requested.   Insurance verification completed.   The patient is insured through [facility] .   Per test claim: PA required; PA submitted to above mentioned insurance via CoverMyMeds Key/confirmation #/[employer] Status is pending

## 2023-02-20 NOTE — Telephone Encounter (Signed)
Mychart message sent to patient to make aware.  ?

## 2023-02-20 NOTE — Telephone Encounter (Signed)
 Pharmacy Patient Advocate Encounter  Received notification from [facility] that Prior Authorization for Repatha  SureClick 140MG /ML auto-injectors  has been APPROVED from 02/20/23 to 02/19/24. Ran test claim, Copay is $30.00. This test claim was processed through [facility]- copay amounts may vary at other pharmacies due to pharmacy/plan contracts, or as the patient moves through the different stages of their insurance plan.   PA #/Case ID/Reference #: PA Case ID #: [id number]

## 2023-03-03 ENCOUNTER — Ambulatory Visit (INDEPENDENT_AMBULATORY_CARE_PROVIDER_SITE_OTHER): Payer: Self-pay | Admitting: *Deleted

## 2023-03-03 DIAGNOSIS — J309 Allergic rhinitis, unspecified: Secondary | ICD-10-CM | POA: Diagnosis not present

## 2023-03-09 ENCOUNTER — Ambulatory Visit (INDEPENDENT_AMBULATORY_CARE_PROVIDER_SITE_OTHER): Payer: 59 | Admitting: *Deleted

## 2023-03-09 DIAGNOSIS — J309 Allergic rhinitis, unspecified: Secondary | ICD-10-CM | POA: Diagnosis not present

## 2023-03-14 ENCOUNTER — Other Ambulatory Visit: Payer: Self-pay | Admitting: Allergy and Immunology

## 2023-03-16 ENCOUNTER — Ambulatory Visit: Payer: Self-pay | Admitting: Internal Medicine

## 2023-03-16 ENCOUNTER — Ambulatory Visit: Payer: 59 | Admitting: Internal Medicine

## 2023-03-16 VITALS — BP 118/62 | HR 67 | Temp 99.5°F | Ht 66.0 in | Wt 150.0 lb

## 2023-03-16 DIAGNOSIS — B349 Viral infection, unspecified: Secondary | ICD-10-CM | POA: Insufficient documentation

## 2023-03-16 DIAGNOSIS — T7840XS Allergy, unspecified, sequela: Secondary | ICD-10-CM

## 2023-03-16 DIAGNOSIS — R6889 Other general symptoms and signs: Secondary | ICD-10-CM

## 2023-03-16 LAB — POCT INFLUENZA A/B
Influenza A, POC: NEGATIVE
Influenza B, POC: NEGATIVE

## 2023-03-16 LAB — POC COVID19 BINAXNOW: SARS Coronavirus 2 Ag: NEGATIVE

## 2023-03-16 LAB — POCT RAPID STREP A (OFFICE): Rapid Strep A Screen: NEGATIVE

## 2023-03-16 MED ORDER — METHYLPREDNISOLONE 4 MG PO TBPK: ORAL_TABLET | ORAL | 0 refills | Status: DC

## 2023-03-16 MED ORDER — HYDROCODONE BIT-HOMATROP MBR 5-1.5 MG/5ML PO SOLN: 5.0000 mL | Freq: Three times a day (TID) | ORAL | 0 refills | Status: DC | PRN

## 2023-03-16 NOTE — Telephone Encounter (Signed)
 Copied from CRM [id number]. 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 Severe body aches, doesn't know about fever, chills, sore throat, hurts everywhere. Wondering if something can be called in for her or if she needs to be seen.   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  Patient states that she does not hear any wheezing herself.  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 Denies difficulty breathing, able to talk in full complete sentences, states soreness in the center of her chest around midsternal that is worse with coughing. Denies any history of blood clots. Patient states she doesn't mind being seen for possible strep throat evaluation and test because her throat feels sore as well and to test for flu and covid.  She also states that if this visit can be a video visit she is fine with that.  Patient is also given home care advice and that's that she has been taking Tylenol  for the body aches. Appointment was made for today 03/16/2023 at her PCP office with Dr Mokuleia   Reason for Disposition  [1] Patient is NOT HIGH RISK AND [2] strongly requests antiviral medicine AND [3] flu symptoms present < 48 hours  Answer Assessment -  Initial Assessment Questions 1. WORST SYMPTOM: What is your worst symptom? (e.g., cough, runny nose, muscle aches, headache, sore throat, fever)      Body aches 2. ONSET: When did your flu symptoms start?      This morning 3. COUGH: How bad is the cough?       Not bad 4. RESPIRATORY DISTRESS: Describe your breathing.      Denies difficulty breathing, able to talk in full complete sentences, states soreness in the center of her chest around midsternal that is worse with coughing. Denies any history of blood clots. 5. FEVER: Do you have a fever? If Yes, ask: What is your temperature, how was it measured, and when did it start?     Low grade 99 6. EXPOSURE: Were you exposed to someone with influenza?       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 FLU VACCINE: Did you get a flu shot this year?     yes 8. HIGH RISK DISEASE: Do you have any chronic medical problems? (e.g., heart or lung disease, asthma, weak immune system, or other HIGH RISK conditions)     no 9. PREGNANCY: Is there any chance you are pregnant? When was your last menstrual period?     no 10. OTHER SYMPTOMS: Do you have any other symptoms?  (e.g., runny nose, muscle aches, headache, sore throat)       Body aches, headache, sore throat,  Protocols used: Influenza (Flu) - [facility]

## 2023-03-16 NOTE — Progress Notes (Signed)
 Subjective:  Patient ID: [name], female    DOB: Nov 03, 1970  Age: 53 y.o. MRN: [id number]  CC: Influenza ( Flu Like Symptoms body aches low grade fever headache since this morning--exposure to flu at work/)   HPI [name] presents for HA, chills, ST, fever x 1 day C/o cough  Outpatient Medications Prior to Visit  Medication Sig Dispense Refill   buPROPion  (WELLBUTRIN  XL) 150 MG 24 hr tablet Take 1 tablet (150 mg total) by mouth daily. (Patient not taking: Reported on 11/01/2022) 90 tablet 1   cetirizine  (ZYRTEC ) 10 MG tablet Take 1 tablet (10 mg total) by mouth 2 (two) times daily as needed for allergies. 60 tablet 5   Cranberry (ELLURA) 200 MG CAPS Take 1 capsule by mouth daily.     EPINEPHrine  (AUVI-Q ) 0.3 mg/0.3 mL IJ SOAJ injection Inject 0.3 mg into the muscle as needed for anaphylaxis. 2 each 2   Evolocumab  (REPATHA  SURECLICK) 140 MG/ML SOAJ Inject 140 mg into the skin every 14 (fourteen) days. 6 mL 1   famotidine  (PEPCID ) 40 MG tablet Take 1 tablet (40 mg total) by mouth every evening. TAKE 1 TABLET(40 MG) BY MOUTH EVERY EVENING 90 tablet 1   fluticasone  (FLONASE ) 50 MCG/ACT nasal spray SHAKE LIQUID AND USE 1 TO 2 SPRAYS IN EACH NOSTRIL DAILY 16 g 5   hydrocortisone  2.5 % cream Apply topically 2 (two) times daily. 30 g 0   hydrocortisone  2.5 % cream Apply topically 2 (two) times daily as needed. 30 g 2   levothyroxine  (SYNTHROID , LEVOTHROID) 100 MCG tablet Take 1 tablet (100 mcg total) by mouth daily. 90 tablet 3   naproxen sodium (ALEVE) 220 MG tablet Take 220 mg by mouth as needed.     nystatin -triamcinolone  ointment (MYCOLOG) Apply topically.     omeprazole  (PRILOSEC) 40 MG capsule Take 1 capsule (40 mg total) by mouth every morning. 90 capsule 1   rosuvastatin  (CRESTOR ) 10 MG tablet TAKE 1 TABLET(10 MG) BY MOUTH DAILY 90 tablet 3   traZODone (DESYREL) 50 MG tablet Take 100 mg by mouth at bedtime.     triamcinolone  ointment (KENALOG ) 0.1 % Apply  topically.     No facility-administered medications prior to visit.    ROS: Review of Systems  Constitutional:  Positive for chills and fatigue. Negative for activity change, appetite change and unexpected weight change.  HENT:  Positive for voice change. Negative for congestion, mouth sores, sinus pressure and trouble swallowing.   Eyes:  Negative for visual disturbance.  Respiratory:  Positive for cough. Negative for chest tightness.   Gastrointestinal:  Negative for abdominal pain, nausea and vomiting.  Genitourinary:  Negative for difficulty urinating, frequency and vaginal pain.  Musculoskeletal:  Negative for back pain and gait problem.  Skin:  Negative for pallor and rash.  Neurological:  Negative for dizziness, tremors, weakness, numbness and headaches.  Psychiatric/Behavioral:  Negative for confusion and sleep disturbance.     Objective:  BP 118/62 (BP Location: Left Arm, Patient Position: Sitting, Cuff Size: Normal)   Pulse 67   Temp 99.5 F (37.5 C) (Oral)   Ht 5' 6 (1.676 m)   Wt 150 lb (68 kg)   SpO2 99%   BMI 24.21 kg/m   BP Readings from Last 3 Encounters:  03/16/23 118/62  11/03/22 130/87  11/01/22 124/84    Wt Readings from Last 3 Encounters:  03/16/23 150 lb (68 kg)  11/03/22 155 lb (70.3 kg)  11/01/22 152 lb (68.9 kg)  Physical Exam Constitutional:      General: She is not in acute distress.    Appearance: Normal appearance. She is well-developed.  HENT:     Head: Normocephalic.     Right Ear: External ear normal.     Left Ear: External ear normal.     Nose: Nose normal.  Eyes:     General:        Right eye: No discharge.        Left eye: No discharge.     Conjunctiva/sclera: Conjunctivae normal.     Pupils: Pupils are equal, round, and reactive to light.  Neck:     Thyroid : No thyromegaly.     Vascular: No JVD.     Trachea: No tracheal deviation.  Cardiovascular:     Rate and Rhythm: Normal rate and regular rhythm.     Heart  sounds: Normal heart sounds.  Pulmonary:     Effort: No respiratory distress.     Breath sounds: No stridor. No wheezing.  Abdominal:     General: Bowel sounds are normal. There is no distension.     Palpations: Abdomen is soft. There is no mass.     Tenderness: There is no abdominal tenderness. There is no guarding or rebound.  Musculoskeletal:        General: No tenderness.     Cervical back: Normal range of motion and neck supple. No rigidity.  Lymphadenopathy:     Cervical: No cervical adenopathy.  Skin:    Findings: No erythema or rash.  Neurological:     Cranial Nerves: No cranial nerve deficit.     Motor: No abnormal muscle tone.     Coordination: Coordination normal.     Deep Tendon Reflexes: Reflexes normal.  Psychiatric:        Behavior: Behavior normal.        Thought Content: Thought content normal.        Judgment: Judgment normal.   flushed  Lab Results  Component Value Date   WBC 7.8 02/10/2022   HGB 14.2 02/10/2022   HCT 43.1 02/10/2022   PLT 313 02/10/2022   GLUCOSE 83 11/01/2022   CHOL 97 (L) 09/23/2022   TRIG 56 09/23/2022   HDL 64 09/23/2022   LDLCALC 20 09/23/2022   ALT 43 (H) 11/01/2022   AST 47 (H) 11/01/2022   NA 139 11/01/2022   K 3.9 11/01/2022   CL 104 11/01/2022   CREATININE 1.06 11/01/2022   BUN 15 11/01/2022   CO2 28 11/01/2022   TSH 0.89 11/01/2022   HGBA1C 5.6 09/23/2022    VAS US  LOWER EXTREMITY VENOUS REFLUX Result Date: 09/02/2022  Lower Venous Reflux Study Patient Name:  [name]  Date of Exam:   09/02/2022 Medical Rec #: [id number]             Accession #:    [id number] Date of Birth: Mar 02, 1970             Patient Gender: F Patient Age:   61 years Exam Location:  [facility] Procedure:      VAS US  LOWER EXTREMITY VENOUS REFLUX Referring Phys: FONDA ROBINS --------------------------------------------------------------------------------  Indications: Varicosities, and Pain.  Performing Technologist:  Devere Dark RVT  Examination Guidelines: A complete evaluation includes B-mode imaging, spectral Doppler, color Doppler, and power Doppler as needed of all accessible portions of each vessel. Bilateral testing is considered an integral part of a complete examination. Limited examinations for reoccurring indications may be performed  as noted. The reflux portion of the exam is performed with the patient in reverse Trendelenburg. Significant venous reflux is defined as >500 ms in the superficial venous system, and >1 second in the deep venous system.  Venous Reflux Times +--------------+---------+------+-----------+------------+--------+ RIGHT         Reflux NoRefluxReflux TimeDiameter cmsComments                         Yes                                  +--------------+---------+------+-----------+------------+--------+ CFV                     yes   >1 second                      +--------------+---------+------+-----------+------------+--------+ FV mid        no                                             +--------------+---------+------+-----------+------------+--------+ Popliteal     no                                             +--------------+---------+------+-----------+------------+--------+ GSV at SFJ                     >500 ms      0.54             +--------------+---------+------+-----------+------------+--------+ GSV prox thighno                            0.44             +--------------+---------+------+-----------+------------+--------+ GSV mid thigh           yes    >500 ms      0.23             +--------------+---------+------+-----------+------------+--------+ GSV dist thigh          yes    >500 ms      0.23             +--------------+---------+------+-----------+------------+--------+ GSV at knee             yes    >500 ms      0.28             +--------------+---------+------+-----------+------------+--------+ GSV prox calf            yes    >500 ms      0.17             +--------------+---------+------+-----------+------------+--------+ GSV mid calf  no                            0.14             +--------------+---------+------+-----------+------------+--------+ SSV Pop Fossa no                            0.26             +--------------+---------+------+-----------+------------+--------+  SSV prox calf no                            0.20             +--------------+---------+------+-----------+------------+--------+ SSV mid calf            yes    >500 ms      0.26             +--------------+---------+------+-----------+------------+--------+ AASV O        no                            0.27             +--------------+---------+------+-----------+------------+--------+ AASV P        no                            0.16             +--------------+---------+------+-----------+------------+--------+ AASV M        no                                             +--------------+---------+------+-----------+------------+--------+   Summary: Right: - No evidence of deep vein thrombosis seen in the right lower extremity, from the common femoral through the popliteal veins. - Venous reflux is noted in the right common femoral vein. - Venous reflux is noted in the right sapheno-femoral junction. - Venous reflux is noted in the right greater saphenous vein in the thigh. - Venous reflux is noted in the right greater saphenous vein in the calf. - Venous reflux is noted in the right short saphenous vein. - No reflux in the right AASV.  *See table(s) above for measurements and observations. Electronically signed by Fonda Rim on 09/02/2022 at 5:09:14 PM.    Final     Assessment & Plan:   Problem List Items Addressed This Visit     Acute viral syndrome   To work next week if ok DayQuil, NyQuil Viral tests POC  Hycodan prn Medrol  pack if asthmatic sx's flare up       Allergy     Multiple allergies/rashes Medrol  pack if asthmatic/rash sx's flare up      Other Visit Diagnoses       Flu-like symptoms    -  Primary   Relevant Orders   POC COVID-19 (Completed)   POCT Influenza A/B (Completed)   POCT rapid strep A (Completed)         Meds ordered this encounter  Medications   methylPREDNISolone  (MEDROL  DOSEPAK) 4 MG TBPK tablet    Sig: As directed    Dispense:  21 tablet    Refill:  0   HYDROcodone  bit-homatropine (HYCODAN) 5-1.5 MG/5ML syrup    Sig: Take 5 mLs by mouth every 8 (eight) hours as needed.    Dispense:  240 mL    Refill:  0      Follow-up: No follow-ups on file.  Marolyn Noel, MD

## 2023-03-16 NOTE — Assessment & Plan Note (Addendum)
To work next week if ok DayQuil, NyQuil Viral tests POC  Hycodan prn Medrol pack if asthmatic sx's flare up

## 2023-03-24 ENCOUNTER — Encounter: Payer: Self-pay | Admitting: Internal Medicine

## 2023-03-24 DIAGNOSIS — T7840XA Allergy, unspecified, initial encounter: Secondary | ICD-10-CM | POA: Insufficient documentation

## 2023-03-24 NOTE — Assessment & Plan Note (Signed)
Multiple allergies/rashes Medrol pack if asthmatic/rash sx's flare up

## 2023-03-28 ENCOUNTER — Ambulatory Visit: Payer: Self-pay | Admitting: Internal Medicine

## 2023-03-28 NOTE — Telephone Encounter (Signed)
Copied from CRM [id number]. Topic: Clinical - Red Word Triage >> Mar 28, 2023 12:17 PM Elizebeth Brooking wrote: [facility] that prompted transfer to Nurse Triage: thought had flu tested everthing high fever, chest pain and body aches through it all. Now Chest hurts when breathes, lightheadedness fatigue   Chief Complaint: Chest Pain Symptoms: Fatigue, Cough, Lightheadedness Frequency: Ongoing Pertinent Negatives: Patient denies passing out or being immediate distress at this time.  Disposition: [x] ED /[] Urgent Care (no appt availability in office) / [] Appointment(In office/virtual)/ []  [facility]/ [] Home Care/ [x] Refused Recommended Disposition /[] [facility]/ []  Follow-up with PCP Additional Notes: Patient contacted via phone regarding concerns of Chest Pain. Discussed symptoms, severity, and duration. The patient reports being lightheaded, coughing, and nausea. The patient additionally reports being diaphoretic from hot flashes. The patient states that taking a deep breath makes the pain worse, and localizes the pain to the central of her chest on occurrence. The pain presents every time she breathes. Based on assessment, patient was advised to seek emergency care as soon as possible. The patient did not agree with disposition and refused to do so. Adamantly requested to be seen by a provider of her choice in office. Additional details include, a contributing history of high cholesterol and the patient Previously finished a steroid on Friday.      Reason for Disposition  Taking a deep breath makes pain worse  Answer Assessment - Initial Assessment Questions 1. LOCATION: "Where does it hurt?"       Middle of the chest, right above breasts  2. RADIATION: "Does the pain go anywhere else?" (e.g., into neck, jaw, arms, back)     Lower Back  3. ONSET: "When did the chest pain begin?" (Minutes, hours or days)      Since last week  4. PATTERN: "Does the pain come and go, or has it  been constant since it started?"  "Does it get worse with exertion?"      With Deep Inspiration  5. DURATION: "How long does it last" (e.g., seconds, minutes, hours)     Intermittent  6. SEVERITY: "How bad is the pain?"  (e.g., Scale 1-10; mild, moderate, or severe)    - MILD (1-3): doesn't interfere with normal activities     - MODERATE (4-7): interferes with normal activities or awakens from sleep    - SEVERE (8-10): excruciating pain, unable to do any normal activities       4  7. CARDIAC RISK FACTORS: "Do you have any history of heart problems or risk factors for heart disease?" (e.g., angina, prior heart attack; diabetes, high blood pressure, high cholesterol, smoker, or strong family history of heart disease)     High cholesterol  8. PULMONARY RISK FACTORS: "Do you have any history of lung disease?"  (e.g., blood clots in lung, asthma, emphysema, birth control pills)     No   9. CAUSE: "What do you think is causing the chest pain?"     Unsure  10. OTHER SYMPTOMS: "Do you have any other symptoms?" (e.g., dizziness, nausea, vomiting, sweating, fever, difficulty breathing, cough)       Cough, Lightheadedness  11. PREGNANCY: "Is there any chance you are pregnant?" "When was your last menstrual period?"       No, NO  Protocols used: Chest Pain-A-AH

## 2023-03-30 ENCOUNTER — Ambulatory Visit: Payer: 59 | Admitting: Internal Medicine

## 2023-03-31 ENCOUNTER — Encounter: Payer: Self-pay | Admitting: Internal Medicine

## 2023-03-31 ENCOUNTER — Ambulatory Visit: Payer: 59

## 2023-03-31 ENCOUNTER — Ambulatory Visit (INDEPENDENT_AMBULATORY_CARE_PROVIDER_SITE_OTHER): Payer: 59 | Admitting: Internal Medicine

## 2023-03-31 VITALS — BP 122/70 | HR 78 | Temp 98.0°F | Ht 66.0 in | Wt 153.0 lb

## 2023-03-31 DIAGNOSIS — R7303 Prediabetes: Secondary | ICD-10-CM | POA: Diagnosis not present

## 2023-03-31 DIAGNOSIS — R052 Subacute cough: Secondary | ICD-10-CM

## 2023-03-31 DIAGNOSIS — R079 Chest pain, unspecified: Secondary | ICD-10-CM

## 2023-03-31 MED ORDER — TRAMADOL HCL 50 MG PO TABS: 50.0000 mg | ORAL_TABLET | Freq: Four times a day (QID) | ORAL | 0 refills | Status: DC | PRN

## 2023-03-31 MED ORDER — PROMETHAZINE-DM 6.25-15 MG/5ML PO SYRP: 5.0000 mL | ORAL_SOLUTION | Freq: Four times a day (QID) | ORAL | 0 refills | Status: DC | PRN

## 2023-03-31 NOTE — Progress Notes (Signed)
 Patient ID: [name], female   DOB: 1970/08/04, 53 y.o.   MRN: [id number]        Chief Complaint: follow up upper mid chest pain and tenderness, cough, preDM ,         HPI:  [name] is a 53 y.o. 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 No uri symptoms or fever this time howevere.  Did also take a course of prednisone for "autoimmune problem" per pt, and did also have an episode of sujective hives a few days ago, but these do not seem to be related, except the chest soreness and pain may have been somewhat improved while on the prednison.  Still has occasional night sweat and fatigue as well.  Pt denies increased sob or doe, wheezing, orthopnea, PND, increased LE swelling, palpitations, dizziness or syncope.  Pt denies polydipsia, polyuria, or new focal neuro s/s.    Pt denies recent wt loss, night sweats, loss of appetite, or other constitutional symptoms        Wt Readings from Last 3 Encounters:  03/31/23 153 lb (69.4 kg)  03/16/23 150 lb (68 kg)  11/03/22 155 lb (70.3 kg)   BP Readings from Last 3 Encounters:  03/31/23 122/70  03/16/23 118/62  11/03/22 130/87         Past Medical History:  Diagnosis Date   Allergy    Anxiety    Complication of anesthesia    " wakes up  crying"   Depression    Eczema    GERD (gastroesophageal reflux disease)    Hyperlipidemia    Hypothyroidism    Interstitial cystitis    Ligament tear    Left elbow   Menorrhagia    Migraines    Neuromuscular disorder (HCC)    PONV (postoperative nausea and vomiting)    likes phenergan and scopolamine patch   Tennis elbow    Tennis elbow    Thyroid disease     Trigger finger of right hand    Urticaria    Past Surgical History:  Procedure Laterality Date   carpal tunnel release Bilateral 2010   tennis elbow also done   CESAREAN SECTION     x 2   CHOLECYSTECTOMY  2003   DIAGNOSTIC LAPAROSCOPY  20002   thru belly button to check for endometriosis   DILITATION & CURRETTAGE/HYSTROSCOPY WITH NOVASURE ABLATION N/A 11/18/2019   Procedure: DILATATION & CURETTAGE/HYSTEROSCOPY WITH NOVASURE ABLATION;  Surgeon: Olivia Mackie, MD;  Location: [facility];  Service: Gynecology;  Laterality: N/A;   ELBOW SURGERY     TRIGGER FINGER RELEASE Right    3rd and 4th digit    reports that she has never smoked. She has been exposed to tobacco smoke. She has never used smokeless tobacco. She reports current alcohol use of about 2.0 standard drinks of alcohol per week. She reports that she does not use drugs. family history includes Cancer in her maternal grandmother; Colon polyps in her father; Diabetes in her maternal grandmother, paternal grandfather, and paternal grandmother; Heart disease in her maternal grandfather and paternal grandfather; Hyperlipidemia in her father, maternal grandfather, and paternal grandfather; Hypertension in her father,  maternal grandfather, maternal grandmother, and paternal grandfather; Melanoma in her mother; Stroke in her paternal grandmother; Thyroid disease in her mother. Allergies  Allergen Reactions   Augmentin [Amoxicillin-Pot Clavulanate] Swelling    Pt has tolerated amoxicillin prior Black rings around eyes   Imuran [Azathioprine]     Muscle aches, joint aches, fatigue, GI upset, itching, rash    Nexlizet [Bempedoic Acid-Ezetimibe] Hives and Swelling   Other     Other reaction(s): Chest Pain   Penicillins     Other reaction(s): Eye Swelling, Headache   Sulfa Antibiotics Shortness Of Breath    Other reaction(s): Unknown   Vibramycin [Doxycycline]     Other reaction(s): Edema, Edema, Unknown   Doxycycline  Calcium Swelling    Retained fluid   Latex Rash   Hycodan [Hydrocodone Bit-Homatrop Mbr]    Metronidazole Other (See Comments)    metronidazole   Levaquin [Levofloxacin] Rash   Nitrofurantoin Rash   Plaquenil [Hydroxychloroquine] Rash   Current Outpatient Medications on File Prior to Visit  Medication Sig Dispense Refill   cetirizine (ZYRTEC) 10 MG tablet Take 1 tablet (10 mg total) by mouth 2 (two) times daily as needed for allergies. 60 tablet 5   Cranberry (ELLURA) 200 MG CAPS Take 1 capsule by mouth daily.     EPINEPHrine (AUVI-Q) 0.3 mg/0.3 mL IJ SOAJ injection Inject 0.3 mg into the muscle as needed for anaphylaxis. 2 each 2   Evolocumab (REPATHA SURECLICK) 140 MG/ML SOAJ Inject 140 mg into the skin every 14 (fourteen) days. 6 mL 1   famotidine (PEPCID) 40 MG tablet Take 1 tablet (40 mg total) by mouth every evening. TAKE 1 TABLET(40 MG) BY MOUTH EVERY EVENING 90 tablet 1   fluticasone (FLONASE) 50 MCG/ACT nasal spray SHAKE LIQUID AND USE 1 TO 2 SPRAYS IN EACH NOSTRIL DAILY 16 g 5   hydrocortisone 2.5 % cream Apply topically 2 (two) times daily. 30 g 0   hydrocortisone 2.5 % cream Apply topically 2 (two) times daily as needed. 30 g 2   levothyroxine (SYNTHROID, LEVOTHROID) 100 MCG tablet Take 1 tablet (100 mcg total) by mouth daily. 90 tablet 3   methylPREDNISolone (MEDROL DOSEPAK) 4 MG TBPK tablet As directed 21 tablet 0   naproxen sodium (ALEVE) 220 MG tablet Take 220 mg by mouth as needed.     nystatin-triamcinolone ointment (MYCOLOG) Apply topically.     omeprazole (PRILOSEC) 40 MG capsule Take 1 capsule (40 mg total) by mouth every morning. 90 capsule 1   rosuvastatin (CRESTOR) 10 MG tablet TAKE 1 TABLET(10 MG) BY MOUTH DAILY 90 tablet 3   traZODone (DESYREL) 50 MG tablet Take 100 mg by mouth at bedtime.     triamcinolone ointment (KENALOG) 0.1 % Apply topically.     buPROPion (WELLBUTRIN XL) 150 MG 24 hr tablet Take 1 tablet (150 mg total) by mouth daily. (Patient not taking:  Reported on 03/31/2023) 90 tablet 1   HYDROcodone bit-homatropine (HYCODAN) 5-1.5 MG/5ML syrup Take 5 mLs by mouth every 8 (eight) hours as needed. (Patient not taking: Reported on 03/31/2023) 240 mL 0   No current facility-administered medications on file prior to visit.        ROS:  All others reviewed and negative.  Objective        PE:  BP 122/70 (BP Location: Right Arm, Patient Position: Sitting, Cuff Size: Normal)   Pulse 78   Temp 98 F (36.7 C) (Oral)   Ht 5\' 6"  (1.676 m)   Wt 153 lb (  69.4 kg)   SpO2 100%   BMI 24.69 kg/m                 Constitutional: Pt appears uncomfortable, non toxic               HENT: Head: NCAT.                Right Ear: External ear normal.                 Left Ear: External ear normal.                Eyes: . Pupils are equal, round, and reactive to light. Conjunctivae and EOM are normal               Nose: without d/c or deformity               Neck: Neck supple. Gross normal ROM               Has moderate tender ness without swelling, rash to bilateral upper parasternal areas.                 Cardiovascular: Normal rate and regular rhythm.                 Pulmonary/Chest: Effort normal and breath sounds without rales or wheezing.                Abd:  Soft, NT, ND, + BS, no organomegaly               Neurological: Pt is alert. At baseline orientation, motor grossly intact               Skin: Skin is warm. No rashes, no other new lesions, LE edema - none               Psychiatric: Pt behavior is normal without agitation   Micro: none  Cardiac tracings I have personally interpreted today:  none  Pertinent Radiological findings (summarize): none   Lab Results  Component Value Date   WBC 7.8 02/10/2022   HGB 14.2 02/10/2022   HCT 43.1 02/10/2022   PLT 313 02/10/2022   GLUCOSE 83 11/01/2022   CHOL 97 (L) 09/23/2022   TRIG 56 09/23/2022   HDL 64 09/23/2022   LDLCALC 20 09/23/2022   ALT 43 (H) 11/01/2022   AST 47 (H) 11/01/2022   NA 139  11/01/2022   K 3.9 11/01/2022   CL 104 11/01/2022   CREATININE 1.06 11/01/2022   BUN 15 11/01/2022   CO2 28 11/01/2022   TSH 0.89 11/01/2022   HGBA1C 5.6 09/23/2022   Assessment/Plan:  Roben Tatsch is a 53 y.o. White or Caucasian [1] female with  has a past medical history of Allergy, Anxiety, Complication of anesthesia, Depression, Eczema, GERD (gastroesophageal reflux disease), Hyperlipidemia, Hypothyroidism, Interstitial cystitis, Ligament tear, Menorrhagia, Migraines, Neuromuscular disorder (HCC), PONV (postoperative nausea and vomiting), Tennis elbow, Tennis elbow, Thyroid disease, Trigger finger of right hand, and Urticaria.  Cough I suspect post infectious, but will need cxr given pain and possible subjective worsening overall , also prometh DM prn asd  Pre-diabetes Lab Results  Component Value Date   HGBA1C 5.6 09/23/2022   Stable, pt to continue current medical treatment - diet, wt control   Chest pain Most likely c/w msk strain with cough or costochondritis by exam - for cxr, as above, tramadol prn  Followup: Return if symptoms worsen or  fail to improve.  Oliver Barre, MD 04/02/2023 4:01 PM [facility] - [facility]

## 2023-03-31 NOTE — Patient Instructions (Signed)
You appear to have likely Costochondritis of the upper sternum area  Please take all new medication as prescribed - the cough medicine, and tramadol as needed for pain  Please continue all other medications as before, and refills have been done if requested.  Please have the pharmacy call with any other refills you may need.  Please keep your appointments with your specialists as you may have planned  Please go to the XRAY Department in the first floor for the x-ray testing  You will be contacted by phone if any changes need to be made immediately.  Otherwise, you will receive a letter about your results with an explanation, but please check with MyChart first.

## 2023-04-02 ENCOUNTER — Encounter: Payer: Self-pay | Admitting: Internal Medicine

## 2023-04-02 DIAGNOSIS — R079 Chest pain, unspecified: Secondary | ICD-10-CM | POA: Insufficient documentation

## 2023-04-02 DIAGNOSIS — R059 Cough, unspecified: Secondary | ICD-10-CM | POA: Insufficient documentation

## 2023-04-02 NOTE — Assessment & Plan Note (Signed)
 Most likely c/w msk strain with cough or costochondritis by exam - for cxr, as above, tramadol prn

## 2023-04-02 NOTE — Assessment & Plan Note (Signed)
 Lab Results  Component Value Date   HGBA1C 5.6 09/23/2022   Stable, pt to continue current medical treatment - diet, wt control

## 2023-04-02 NOTE — Assessment & Plan Note (Signed)
 I suspect post infectious, but will need cxr given pain and possible subjective worsening overall , also prometh DM prn asd

## 2023-04-10 ENCOUNTER — Encounter: Payer: Self-pay | Admitting: Internal Medicine

## 2023-04-10 ENCOUNTER — Telehealth: Payer: Self-pay

## 2023-04-10 NOTE — Telephone Encounter (Signed)
 The xray did not show anything new, so it appears the pain must be muscular.  Ok for tylenol as needed

## 2023-04-10 NOTE — Telephone Encounter (Signed)
 Copied from CRM [id number]. Topic: Clinical - Lab/Test Results >> Apr 10, 2023  8:16 AM Truddie Crumble wrote: Reason for CRM: Patient saw Dr. Oliver Barre on 2/21 for chest pains and when she breathes deep it still hurts and she has not heard anything back from the chest xray that she did

## 2023-04-11 NOTE — Telephone Encounter (Signed)
 Called and let Pt know

## 2023-04-13 ENCOUNTER — Ambulatory Visit (INDEPENDENT_AMBULATORY_CARE_PROVIDER_SITE_OTHER)

## 2023-04-13 DIAGNOSIS — J309 Allergic rhinitis, unspecified: Secondary | ICD-10-CM

## 2023-04-25 ENCOUNTER — Encounter: Payer: Self-pay | Admitting: Internal Medicine

## 2023-04-25 ENCOUNTER — Ambulatory Visit: Admitting: Internal Medicine

## 2023-04-25 VITALS — BP 112/60 | HR 89 | Temp 98.6°F | Wt 155.0 lb

## 2023-04-25 DIAGNOSIS — J189 Pneumonia, unspecified organism: Secondary | ICD-10-CM | POA: Diagnosis not present

## 2023-04-25 DIAGNOSIS — F419 Anxiety disorder, unspecified: Secondary | ICD-10-CM

## 2023-04-25 DIAGNOSIS — M351 Other overlap syndromes: Secondary | ICD-10-CM

## 2023-04-25 MED ORDER — DULOXETINE HCL 30 MG PO CPEP: 30.0000 mg | ORAL_CAPSULE | Freq: Every day | ORAL | 3 refills | Status: DC

## 2023-04-25 MED ORDER — AZITHROMYCIN 250 MG PO TABS: ORAL_TABLET | ORAL | 0 refills | Status: AC

## 2023-04-25 NOTE — Assessment & Plan Note (Signed)
 More situational and has family living with them causing some stress. She would like to try duloxetine 30 mg daily and let us know in 3-4 weeks how this is doing.

## 2023-04-25 NOTE — Assessment & Plan Note (Signed)
 Symptoms ongoing 2 months with sputum. Rx azithromycin 5 day course. Continue to use allergy medicine and otc. She is in a building which is old and mold could be contributing.

## 2023-04-25 NOTE — Patient Instructions (Addendum)
 We have sent in the azithromycin to help.  We have sent in duloxetine (cymbalta) to try for anxiety/mood.

## 2023-04-25 NOTE — Progress Notes (Signed)
   Subjective:   Patient ID: [name], female    DOB: August 10, 1970, 53 y.o.   MRN: [id number]  Cough Pertinent negatives include no chest pain or shortness of breath.   The patient is a 53 YO female coming in for several concerns. She is still sick and coughing up sputum going on for about 2 months. Has done otc and steroids and cough medicine without full relief. She is not worsening but not improving. She is also having more situational anxiety from living situation (son and grandson living with them).   Review of Systems  Constitutional: Negative.   HENT:  Positive for congestion and sinus pressure.   Eyes: Negative.   Respiratory:  Positive for cough and chest tightness. Negative for shortness of breath.   Cardiovascular:  Negative for chest pain, palpitations and leg swelling.  Gastrointestinal:  Negative for abdominal distention, abdominal pain, constipation, diarrhea, nausea and vomiting.  Musculoskeletal: Negative.   Skin: Negative.   Neurological: Negative.   Psychiatric/Behavioral:  Positive for dysphoric mood. The patient is nervous/anxious.     Objective:  Physical Exam Constitutional:      Appearance: She is well-developed.  HENT:     Head: Normocephalic and atraumatic.     Comments: Oropharynx with redness and clear drainage, nose with swollen turbinates, TMs normal bilaterally.  Neck:     Thyroid: No thyromegaly.  Cardiovascular:     Rate and Rhythm: Normal rate and regular rhythm.  Pulmonary:     Effort: Pulmonary effort is normal. No respiratory distress.     Breath sounds: Normal breath sounds. No wheezing or rales.  Abdominal:     Palpations: Abdomen is soft.  Musculoskeletal:        General: Tenderness present.     Cervical back: Normal range of motion.  Lymphadenopathy:     Cervical: No cervical adenopathy.  Skin:    General: Skin is warm and dry.  Neurological:     Mental Status: She is alert and oriented to person, place, and time.      Vitals:   04/25/23 1450  BP: 112/60  Pulse: 89  Temp: 98.6 F (37 C)  TempSrc: Oral  SpO2: 98%  Weight: 155 lb (70.3 kg)    Assessment & Plan:

## 2023-04-25 NOTE — Assessment & Plan Note (Signed)
 Patient was discussing with me today that they think she may have fibromyalgia and her [facility]ologist is leaving practice and recommends she return to [place] for care. She is unsure about this diagnosis as she has several areas of pain which improve with exercise.

## 2023-04-27 ENCOUNTER — Ambulatory Visit: Admitting: Internal Medicine

## 2023-04-27 ENCOUNTER — Ambulatory Visit: Payer: Self-pay

## 2023-04-27 ENCOUNTER — Telehealth: Payer: Self-pay | Admitting: Internal Medicine

## 2023-04-27 ENCOUNTER — Encounter: Payer: Self-pay | Admitting: Internal Medicine

## 2023-04-27 VITALS — BP 122/76 | HR 76 | Temp 98.1°F | Ht 66.0 in | Wt 151.0 lb

## 2023-04-27 DIAGNOSIS — R051 Acute cough: Secondary | ICD-10-CM | POA: Diagnosis not present

## 2023-04-27 DIAGNOSIS — R7303 Prediabetes: Secondary | ICD-10-CM

## 2023-04-27 DIAGNOSIS — R079 Chest pain, unspecified: Secondary | ICD-10-CM

## 2023-04-27 DIAGNOSIS — R062 Wheezing: Secondary | ICD-10-CM

## 2023-04-27 DIAGNOSIS — R058 Other specified cough: Secondary | ICD-10-CM | POA: Insufficient documentation

## 2023-04-27 MED ORDER — METHYLPREDNISOLONE ACETATE 80 MG/ML IJ SUSP
80.0000 mg | Freq: Once | INTRAMUSCULAR | Status: AC
  Administered 2023-04-27: 80 mg via INTRAMUSCULAR

## 2023-04-27 MED ORDER — HYDROCODONE BIT-HOMATROP MBR 5-1.5 MG/5ML PO SOLN: 5.0000 mL | Freq: Four times a day (QID) | ORAL | 0 refills | Status: DC | PRN

## 2023-04-27 MED ORDER — ALBUTEROL SULFATE HFA 108 (90 BASE) MCG/ACT IN AERS: 2.0000 | INHALATION_SPRAY | Freq: Four times a day (QID) | RESPIRATORY_TRACT | 0 refills | Status: AC | PRN

## 2023-04-27 MED ORDER — PREDNISONE 10 MG PO TABS: ORAL_TABLET | ORAL | 0 refills | Status: DC

## 2023-04-27 MED ORDER — CLARITHROMYCIN 500 MG PO TABS
500.0000 mg | ORAL_TABLET | Freq: Two times a day (BID) | ORAL | 0 refills | Status: DC
Start: 2023-04-27 — End: 2023-05-09

## 2023-04-27 NOTE — Progress Notes (Signed)
 Patient ID: [name], female   DOB: Mar 06, 1970, 53 y.o.   MRN: [id number]        Chief Complaint: follow up productive cough       HPI:  [name] is a 53 y.o. female Here with acute onset mild to mod 2-3 days ST, HA, general weakness and malaise, with prod cough greenish sputum, but Pt denies chest pain, increased sob or doe, wheezing, orthopnea, PND, increased LE swelling, palpitations, dizziness or syncope except for onset mild wheezing sob since yesterday as well.   Pt denies polydipsia, polyuria, or new focal neuro s/s.    Pt denies wt loss, night sweats, loss of appetite, or other constitutional symptoms         Wt Readings from Last 3 Encounters:  04/27/23 151 lb (68.5 kg)  04/25/23 155 lb (70.3 kg)  03/31/23 153 lb (69.4 kg)   BP Readings from Last 3 Encounters:  04/27/23 122/76  04/25/23 112/60  03/31/23 122/70         Past Medical History:  Diagnosis Date   Allergy    Anxiety    Complication of anesthesia    " wakes up  crying"   Depression    Eczema    GERD (gastroesophageal reflux disease)    Hyperlipidemia    Hypothyroidism    Interstitial cystitis    Ligament tear    Left elbow   Menorrhagia    Migraines    Neuromuscular disorder (HCC)    PONV (postoperative nausea and vomiting)    likes phenergan and scopolamine patch   Tennis elbow    Tennis elbow    Thyroid disease    Trigger finger of right hand    Urticaria    Past Surgical History:  Procedure Laterality Date   carpal tunnel release Bilateral 2010   tennis elbow also done   CESAREAN SECTION     x 2   CHOLECYSTECTOMY  2003   DIAGNOSTIC LAPAROSCOPY  20002   thru belly button to check for endometriosis   DILITATION & CURRETTAGE/HYSTROSCOPY WITH NOVASURE ABLATION N/A 11/18/2019   Procedure: DILATATION & CURETTAGE/HYSTEROSCOPY WITH NOVASURE ABLATION;  Surgeon: Olivia Mackie, MD;  Location: [facility];  Service: Gynecology;  Laterality: N/A;   ELBOW SURGERY      TRIGGER FINGER RELEASE Right    3rd and 4th digit    reports that she has never smoked. She has been exposed to tobacco smoke. She has never used smokeless tobacco. She reports current alcohol use of about 2.0 standard drinks of alcohol per week. She reports that she does not use drugs. family history includes Cancer in her maternal grandmother; Colon polyps in her father; Diabetes in her maternal grandmother, paternal grandfather, and paternal grandmother; Heart disease in her maternal grandfather and paternal grandfather; Hyperlipidemia in her father, maternal grandfather, and paternal grandfather; Hypertension in her father, maternal grandfather, maternal grandmother, and paternal grandfather; Melanoma in her mother; Stroke in her paternal grandmother; Thyroid disease in her mother. Allergies  Allergen Reactions   Augmentin [Amoxicillin-Pot Clavulanate] Swelling    Pt has tolerated amoxicillin prior Black rings around eyes   Imuran [Azathioprine]     Muscle aches, joint aches, fatigue, GI upset, itching, rash    Nexlizet [Bempedoic Acid-Ezetimibe] Hives and Swelling   Other     Other reaction(s): Chest Pain   Penicillins     Other reaction(s): Eye Swelling, Headache   Sulfa Antibiotics Shortness Of Breath    Other reaction(s): Unknown  Vibramycin [Doxycycline]     Other reaction(s): Edema, Edema, Unknown   Doxycycline Calcium Swelling    Retained fluid   Latex Rash   Hycodan [Hydrocodone Bit-Homatrop Mbr]    Metronidazole Other (See Comments)    metronidazole   Levaquin [Levofloxacin] Rash   Nitrofurantoin Rash   Plaquenil [Hydroxychloroquine] Rash   Current Outpatient Medications on File Prior to Visit  Medication Sig Dispense Refill   azithromycin (ZITHROMAX) 250 MG tablet Take 2 tablets on day 1, then 1 tablet daily on days 2 through 5 6 tablet 0   cetirizine (ZYRTEC) 10 MG tablet Take 1 tablet (10 mg total) by mouth 2 (two) times daily as needed for allergies. 60 tablet 5    Cranberry (ELLURA) 200 MG CAPS Take 1 capsule by mouth daily.     DULoxetine (CYMBALTA) 30 MG capsule Take 1 capsule (30 mg total) by mouth daily. 30 capsule 3   EPINEPHrine (AUVI-Q) 0.3 mg/0.3 mL IJ SOAJ injection Inject 0.3 mg into the muscle as needed for anaphylaxis. 2 each 2   Evolocumab (REPATHA SURECLICK) 140 MG/ML SOAJ Inject 140 mg into the skin every 14 (fourteen) days. 6 mL 1   famotidine (PEPCID) 40 MG tablet Take 1 tablet (40 mg total) by mouth every evening. TAKE 1 TABLET(40 MG) BY MOUTH EVERY EVENING 90 tablet 1   fluconazole (DIFLUCAN) 150 MG tablet Take by mouth.     fluticasone (FLONASE) 50 MCG/ACT nasal spray SHAKE LIQUID AND USE 1 TO 2 SPRAYS IN EACH NOSTRIL DAILY 16 g 5   hydrocortisone 2.5 % cream Apply topically 2 (two) times daily. 30 g 0   hydrocortisone 2.5 % cream Apply topically 2 (two) times daily as needed. 30 g 2   levothyroxine (SYNTHROID, LEVOTHROID) 100 MCG tablet Take 1 tablet (100 mcg total) by mouth daily. 90 tablet 3   naproxen sodium (ALEVE) 220 MG tablet Take 220 mg by mouth as needed.     nystatin ointment (MYCOSTATIN) SMARTSIG:1 Topical Daily     nystatin-triamcinolone ointment (MYCOLOG) Apply topically.     omeprazole (PRILOSEC) 40 MG capsule Take 1 capsule (40 mg total) by mouth every morning. 90 capsule 1   promethazine-dextromethorphan (PROMETHAZINE-DM) 6.25-15 MG/5ML syrup Take 5 mLs by mouth 4 (four) times daily as needed. 118 mL 0   rosuvastatin (CRESTOR) 10 MG tablet TAKE 1 TABLET(10 MG) BY MOUTH DAILY 90 tablet 3   traMADol (ULTRAM) 50 MG tablet Take 1 tablet (50 mg total) by mouth every 6 (six) hours as needed. 30 tablet 0   traZODone (DESYREL) 50 MG tablet Take 100 mg by mouth at bedtime.     triamcinolone ointment (KENALOG) 0.1 % Apply topically.     No current facility-administered medications on file prior to visit.        ROS:  All others reviewed and negative.  Objective        PE:  BP 122/76 (BP Location: Left Arm, Patient  Position: Sitting, Cuff Size: Normal)   Pulse 76   Temp 98.1 F (36.7 C) (Oral)   Ht 5\' 6"  (1.676 m)   Wt 151 lb (68.5 kg)   SpO2 100%   BMI 24.37 kg/m                 Constitutional: Pt appears in NAD               HENT: Head: NCAT.                Right Ear:  External ear normal.                 Left Ear: External ear normal.                Eyes: . Pupils are equal, round, and reactive to light. Conjunctivae and EOM are normal               Nose: without d/c or deformity               Neck: Neck supple. Gross normal ROM               Cardiovascular: Normal rate and regular rhythm.                 Pulmonary/Chest: Effort normal and breath sounds decreased without rales but few bilateral wheezing.                               Neurological: Pt is alert. At baseline orientation, motor grossly intact               Skin: Skin is warm. No rashes, no other new lesions, LE edema - none               Psychiatric: Pt behavior is normal without agitation   Micro: none  Cardiac tracings I have personally interpreted today:  none  Pertinent Radiological findings (summarize): none   Lab Results  Component Value Date   WBC 7.8 02/10/2022   HGB 14.2 02/10/2022   HCT 43.1 02/10/2022   PLT 313 02/10/2022   GLUCOSE 83 11/01/2022   CHOL 97 (L) 09/23/2022   TRIG 56 09/23/2022   HDL 64 09/23/2022   LDLCALC 20 09/23/2022   ALT 43 (H) 11/01/2022   AST 47 (H) 11/01/2022   NA 139 11/01/2022   K 3.9 11/01/2022   CL 104 11/01/2022   CREATININE 1.06 11/01/2022   BUN 15 11/01/2022   CO2 28 11/01/2022   TSH 0.89 11/01/2022   HGBA1C 5.6 09/23/2022   Assessment/Plan:  Kenlyn Lose is a 53 y.o. White or Caucasian [1] female with  has a past medical history of Allergy, Anxiety, Complication of anesthesia, Depression, Eczema, GERD (gastroesophageal reflux disease), Hyperlipidemia, Hypothyroidism, Interstitial cystitis, Ligament tear, Menorrhagia, Migraines, Neuromuscular disorder (HCC), PONV  (postoperative nausea and vomiting), Tennis elbow, Tennis elbow, Thyroid disease, Trigger finger of right hand, and Urticaria.  Cough Mild to mod, for antibx course, biaxin 500 bid, hydromet prn cough, to f/u any worsening symptoms or concerns  Wheezing Mild to mod, for prednisone taper, depomedrol 80 mg IM,  to f/u any worsening symptoms or concerns  Pre-diabetes Lab Results  Component Value Date   HGBA1C 5.6 09/23/2022   Stable, pt to continue current medical treatment  - diet, wt control  Followup: Return if symptoms worsen or fail to improve.  Oliver Barre, MD 04/29/2023 7:40 PM [facility]

## 2023-04-27 NOTE — Telephone Encounter (Addendum)
 Chief Complaint: pneumonia worsening on antibiotic Symptoms: hoarse voice, body aches, chills, cough with light green mucus, chest pain Frequency: symptoms started 03/11/23, worsened yesterday since OV on 04/25/23 Pertinent Negatives: Patient denies N/A. Disposition: [x] ED /[] Urgent Care (no appt availability in office) / [] Appointment(In office/virtual)/ []  [facility]/ [] Home Care/ [x] Refused Recommended Disposition /[] [facility]/ []  Follow-up with PCP Additional Notes: Patient seen in office on 04/25/23 by Dr Okey Dupre and diagnosed with pneumonia. Patient began taking azithromycin on 04/25/23 and states she is feeling worse. She states the symptoms all started 03/11/23 and have been worsening on and off since then. She states she is also taking Nyquil and Dayquil. Patient states she has a substitute today and tomorrow and is available to come in to be seen by Dr Okey Dupre. Advised due to worsening chest pain with pneumonia, protocol disposition is ED. Patient refusing ED disposition, called [facility] and staff member, Maralyn Sago, aware.  Copied from CRM [id number]. Topic: Clinical - Red Word Triage >> Apr 27, 2023 10:26 AM Elizebeth Brooking wrote: Red Word that prompted transfer to Nurse Triage: Patient called in was diagnosed with Walking Pneumonia Tuesday. Symptoms not getting any better, stating her chest is in pain when she breaths. Reason for Disposition  New-onset or worsening chest pain  Answer Assessment - Initial Assessment Questions 1. SYMPTOM: "What's the main symptom you're concerned about?" (e.g., breathing difficulty, fever, weakness)     Chest pain (hurts to talk), hoarse voice, body aches, chills.  2. ONSET: "When did the symptoms start?"     03/11/23.  3. BETTER-SAME-WORSE: "Are you getting better, staying the same, or getting worse compared to the day you were discharged?"     Worse. Patient states since yesterday morning she felt worse with body aches, chills, hoarse voice.  4.  BREATHING DIFFICULTY: "Are you having any difficulty breathing?" If Yes, ask: "How bad is it?"  (e.g., none, mild, moderate, severe)   - MILD: No SOB at rest, mild SOB with walking, speaks normally in sentences, can lie down, no retractions, pulse < 100.    - MODERATE: SOB at rest, SOB with minimal exertion and prefers to sit, cannot lie down flat, speaks in phrases, mild retractions, audible wheezing, pulse 100-120.    - SEVERE: Very SOB at rest, speaks in single words, struggling to breathe, sitting hunched forward, retractions, pulse > 120      She states it is not difficult to breathe but it is painful.  5. FEVER: "Do you have a fever?" If Yes, ask: "What is your temperature, how was it measured, and when did it start?"     She states she had a fever last night but did not check a temperature.  6. SPUTUM: "Describe the color of your sputum" (clear, white, yellow, green, blood-tinged)     Light green.  7. DIAGNOSIS CONFIRMATION: "When was the pneumonia diagnosed?" "By whom?"     05/05/23 by Dr Okey Dupre.  8. ANTIBIOTIC: "Are you taking an antibiotic?"  If Yes, ask: "Which one?" "When was it started?"     Yes, azithromycin started on 04/25/23.  9. OTHER TREATMENT: "Are you receiving any other treatment for the pneumonia?" (e.g., albuterol nebulizer, oxygen) If Yes, ask: "How often?" and "Does it help?"     Denies.  10. HOSPITAL ADMISSION: "Were you hospitalized for this pneumonia?" If Yes, ask: "When were you discharged home from the hospital?"       Denies.  11. O2 SATURATION MONITOR:  "Do you use  an oxygen saturation monitor (pulse oximeter) at home?" If Yes, "What is your reading (oxygen level) today?" "What is your usual oxygen saturation reading?" (e.g., 95%)       She states she does not have one.  Protocols used: Pneumonia Follow-up Call-A-AH

## 2023-04-27 NOTE — Telephone Encounter (Signed)
 Call patient and she if she has developed any new symptoms

## 2023-04-27 NOTE — Telephone Encounter (Signed)
 Ok for acute visit tomorrow likely will not be with me

## 2023-04-27 NOTE — Patient Instructions (Signed)
 Ok to change the zpack to clarithromycin  Please take all new medication as prescribed  - the prednisone, cough medicine, and inhaler as needed  You had the steroid shot today  Please continue all other medications as before  Please have the pharmacy call with any other refills you may need.  Please keep your appointments with your specialists as you may have planned

## 2023-04-27 NOTE — Telephone Encounter (Signed)
 FYI:  Nurse triage line called and said that patient's pneumonia is getting worse but she refused to go to the ER.

## 2023-04-29 ENCOUNTER — Encounter: Payer: Self-pay | Admitting: Internal Medicine

## 2023-04-29 NOTE — Assessment & Plan Note (Signed)
 Mild to mod, for antibx course, biaxin 500 bid, hydromet prn cough, to f/u any worsening symptoms or concerns

## 2023-04-29 NOTE — Assessment & Plan Note (Signed)
 Lab Results  Component Value Date   HGBA1C 5.6 09/23/2022   Stable, pt to continue current medical treatment - diet, wt control

## 2023-04-29 NOTE — Assessment & Plan Note (Addendum)
 Mild to mod, for prednisone taper, depomedrol 80 mg IM,  to f/u any worsening symptoms or concerns

## 2023-05-02 ENCOUNTER — Ambulatory Visit: Payer: BC Managed Care – PPO | Admitting: Allergy and Immunology

## 2023-05-05 ENCOUNTER — Other Ambulatory Visit: Payer: Self-pay | Admitting: Allergy and Immunology

## 2023-05-05 ENCOUNTER — Ambulatory Visit (INDEPENDENT_AMBULATORY_CARE_PROVIDER_SITE_OTHER): Payer: Self-pay

## 2023-05-05 DIAGNOSIS — J309 Allergic rhinitis, unspecified: Secondary | ICD-10-CM

## 2023-05-09 ENCOUNTER — Ambulatory Visit: Payer: BC Managed Care – PPO | Admitting: Allergy and Immunology

## 2023-05-09 VITALS — BP 102/62 | HR 86 | Temp 98.5°F | Resp 18 | Ht 66.25 in | Wt 152.7 lb

## 2023-05-09 DIAGNOSIS — J3089 Other allergic rhinitis: Secondary | ICD-10-CM | POA: Diagnosis not present

## 2023-05-09 DIAGNOSIS — J4530 Mild persistent asthma, uncomplicated: Secondary | ICD-10-CM | POA: Diagnosis not present

## 2023-05-09 DIAGNOSIS — K219 Gastro-esophageal reflux disease without esophagitis: Secondary | ICD-10-CM

## 2023-05-09 MED ORDER — FLUTICASONE PROPIONATE 50 MCG/ACT NA SUSP: 2.0000 | Freq: Every morning | NASAL | 1 refills | Status: DC

## 2023-05-09 MED ORDER — OLOPATADINE HCL 0.2 % OP SOLN: 1.0000 [drp] | Freq: Every morning | OPHTHALMIC | 1 refills | Status: AC

## 2023-05-09 MED ORDER — AIRSUPRA 90-80 MCG/ACT IN AERO: 2.0000 | INHALATION_SPRAY | RESPIRATORY_TRACT | 1 refills | Status: DC | PRN

## 2023-05-09 MED ORDER — CETIRIZINE HCL 10 MG PO TABS: 10.0000 mg | ORAL_TABLET | Freq: Every day | ORAL | 1 refills | Status: DC | PRN

## 2023-05-09 MED ORDER — MOMETASONE FUROATE 110 MCG/ACT IN AEPB: 1.0000 | INHALATION_SPRAY | Freq: Two times a day (BID) | RESPIRATORY_TRACT | 1 refills | Status: DC

## 2023-05-09 MED ORDER — FAMOTIDINE 40 MG PO TABS: 40.0000 mg | ORAL_TABLET | Freq: Every evening | ORAL | 1 refills | Status: DC

## 2023-05-09 MED ORDER — SYSTANE 0.4-0.3 % OP SOLN: 1.0000 [drp] | OPHTHALMIC | 1 refills | Status: AC | PRN

## 2023-05-09 NOTE — Patient Instructions (Addendum)
  1.  Continue to perform Allergen avoidance measures as best as possible - dog / mold  2. Continue to Treat and prevent inflammation:   A.  Flonase 1-2 sprays each nostril daily  B.  Immunotherapy - dog / mold  C.  START Asmanex 110 Twisthaler - 1 inhalation 2 times per day  4.  If needed:   A.  Nasal saline  B.  Cetirizine 10 mg - 1-2 times per day  C.  Auvi-Q 0.3, Benadryl, MD/ER evaluation for allergic reaction  D.  Systane eye drops multiple times per day  E.  OTC Pataday - 1 drop each eye 1 time per day  F.  Famotidine 40 mg - 1 tablet in PM  G. Airsupra - 2 inhalations every 6 hours (replaces albuterol)  5.  Influenza = Tamiflu. Covid = Paxlovid  6. Return to clinic in 8 weeks or earlier if problem. Taper medications???

## 2023-05-09 NOTE — Progress Notes (Unsigned)
 [place] - [place] - [place] - [place] - [place]   Follow-up Note  Referring Provider: Myrlene Broker, * Primary Provider: Myrlene Broker, MD Date of Office Visit: 05/09/2023  Subjective:   [name] (DOB: Jun 07, 1970) is a 53 y.o. female who [facility] in re-evaluation of the following:  HPI: [name] returns to this clinic in evaluation of allergic rhinitis, allergic conjunctivitis, history of inflammatory dermatosis, history of urticaria, history of reflux, history of adverse response to wheat and corn consumption.  I last saw in this clinic 25 October 2022.  She became sick at the tail end of this winter.  She had several different infections.  She had a fever 103 along with laryngitis and coughing and wheezing and chest pain and she apparently had negative viral swabs and she was treated with a few antibiotics and subsequently ended up with clarithromycin and prednisone which appeared to clear up her issue.  Apparently she had a chest x-ray which did not identify any significant abnormality.  She was given an inhaler to use and this is actually helped her somewhat during this bout and which she has noticed that ever since she became infected with that organism she has had sensitivity to smokes and fumes and perfumes with the development of some chest tightness and coughing.  She has had very little problems with her nose or eyes.  Her skin has been under good control.  Her reflux is under excellent control and she no longer uses any therapy for this issue.  This improvement correlates with significant weight loss.  She does not consume wheat or corn.  Allergies as of 05/09/2023       Reactions   Augmentin [amoxicillin-pot Clavulanate] Swelling   Pt has tolerated amoxicillin prior Black rings around eyes   Imuran [azathioprine]    Muscle aches, joint aches, fatigue, GI upset, itching, rash    Nexlizet  [bempedoic Acid-ezetimibe] Hives, Swelling   Other    Other reaction(s): Chest Pain   Penicillins    Other reaction(s): Eye Swelling, Headache   Sulfa Antibiotics Shortness Of Breath   Other reaction(s): Unknown   Vibramycin [doxycycline]    Other reaction(s): Edema, Edema, Unknown   Doxycycline Calcium Swelling   Retained fluid   Latex Rash   Hycodan [hydrocodone Bit-homatrop Mbr]    Metronidazole Other (See Comments)   metronidazole   Levaquin [levofloxacin] Rash   Nitrofurantoin Rash   Plaquenil [hydroxychloroquine] Rash        Medication List    albuterol 108 (90 Base) MCG/ACT inhaler Commonly known as: VENTOLIN HFA Inhale 2 puffs into the lungs every 6 (six) hours as needed for wheezing or shortness of breath.   cetirizine 10 MG tablet Commonly known as: ZYRTEC Take 1 tablet (10 mg total) by mouth 2 (two) times daily as needed for allergies.   clindamycin 1 % gel Commonly known as: CLINDAGEL Apply 1 Application topically 2 (two) times daily.   DULoxetine 30 MG capsule Commonly known as: Cymbalta Take 1 capsule (30 mg total) by mouth daily.   Ellura 200 MG Caps Generic drug: Cranberry Take 1 capsule by mouth daily.   EPINEPHrine 0.3 mg/0.3 mL Soaj injection Commonly known as: Auvi-Q Inject 0.3 mg into the muscle as needed for anaphylaxis.   famotidine 40 MG tablet Commonly known as: PEPCID TAKE 1 TABLET(40 MG) BY MOUTH EVERY EVENING   fluconazole 150 MG tablet Commonly known as: DIFLUCAN Take by mouth.  fluticasone 50 MCG/ACT nasal spray Commonly known as: FLONASE SHAKE LIQUID AND USE 1 TO 2 SPRAYS IN EACH NOSTRIL DAILY   hydrocortisone 2.5 % cream Apply topically 2 (two) times daily as needed.   ketoconazole 2 % shampoo Commonly known as: NIZORAL Apply 1 Application topically 2 (two) times a week.   levothyroxine 100 MCG tablet Commonly known as: SYNTHROID Take 1 tablet (100 mcg total) by mouth daily.   nystatin ointment Commonly known  as: MYCOSTATIN SMARTSIG:1 Topical Daily   nystatin-triamcinolone ointment Commonly known as: MYCOLOG Apply topically.   Repatha SureClick 140 MG/ML Soaj Generic drug: Evolocumab Inject 140 mg into the skin every 14 (fourteen) days.   rosuvastatin 10 MG tablet Commonly known as: CRESTOR TAKE 1 TABLET(10 MG) BY MOUTH DAILY   traZODone 50 MG tablet Commonly known as: DESYREL Take 100 mg by mouth at bedtime.    Past Medical History:  Diagnosis Date   Allergy    Anxiety    Complication of anesthesia    " wakes up  crying"   Depression    Eczema    GERD (gastroesophageal reflux disease)    Hyperlipidemia    Hypothyroidism    Interstitial cystitis    Ligament tear    Left elbow   Menorrhagia    Migraines    Neuromuscular disorder (HCC)    PONV (postoperative nausea and vomiting)    likes phenergan and scopolamine patch   Tennis elbow    Tennis elbow    Thyroid disease    Trigger finger of right hand    Urticaria     Past Surgical History:  Procedure Laterality Date   carpal tunnel release Bilateral 2010   tennis elbow also done   CESAREAN SECTION     x 2   CHOLECYSTECTOMY  2003   DIAGNOSTIC LAPAROSCOPY  20002   thru belly button to check for endometriosis   DILITATION & CURRETTAGE/HYSTROSCOPY WITH NOVASURE ABLATION N/A 11/18/2019   Procedure: DILATATION & CURETTAGE/HYSTEROSCOPY WITH NOVASURE ABLATION;  Surgeon: Olivia Mackie, MD;  Location: [facility];  Service: Gynecology;  Laterality: N/A;   ELBOW SURGERY     TRIGGER FINGER RELEASE Right    3rd and 4th digit    Review of systems negative except as noted in HPI / PMHx or noted below:  Review of Systems  Constitutional: Negative.   HENT: Negative.    Eyes: Negative.   Respiratory: Negative.    Cardiovascular: Negative.   Gastrointestinal: Negative.   Genitourinary: Negative.   Musculoskeletal: Negative.   Skin: Negative.   Neurological: Negative.   Endo/Heme/Allergies: Negative.    Psychiatric/Behavioral: Negative.       Objective:   Vitals:   05/09/23 1631  BP: 102/62  Pulse: 86  Resp: 18  Temp: 98.5 F (36.9 C)  SpO2: 97%   Height: 5' 6.25" (168.3 cm)  Weight: 152 lb 11.2 oz (69.3 kg)   Physical Exam Constitutional:      Appearance: She is not diaphoretic.  HENT:     Head: Normocephalic.     Right Ear: Tympanic membrane, ear canal and external ear normal.     Left Ear: Tympanic membrane, ear canal and external ear normal.     Nose: Nose normal. No mucosal edema or rhinorrhea.     Mouth/Throat:     Pharynx: Uvula midline. No oropharyngeal exudate.  Eyes:     Conjunctiva/sclera: Conjunctivae normal.  Neck:     Thyroid: No thyromegaly.     Trachea: Trachea  normal. No tracheal tenderness or tracheal deviation.  Cardiovascular:     Rate and Rhythm: Normal rate and regular rhythm.     Heart sounds: Normal heart sounds, S1 normal and S2 normal. No murmur heard. Pulmonary:     Effort: No respiratory distress.     Breath sounds: Normal breath sounds. No stridor. No wheezing or rales.  Lymphadenopathy:     Head:     Right side of head: No tonsillar adenopathy.     Left side of head: No tonsillar adenopathy.     Cervical: No cervical adenopathy.  Skin:    Findings: No erythema or rash.     Nails: There is no clubbing.  Neurological:     Mental Status: She is alert.     Diagnostics: Spirometry was performed and demonstrated an FEV1 of 2.21 at 78 % of predicted.  Assessment and Plan:   1. Perennial allergic rhinitis   2. LPRD (laryngopharyngeal reflux disease)   3. Not well controlled mild persistent asthma    1.  Continue to perform Allergen avoidance measures as best as possible - dog / mold  2. Continue to Treat and prevent inflammation:   A.  Flonase 1-2 sprays each nostril daily  B.  Immunotherapy - dog / mold  C.  START Asmanex 110 Twisthaler - 1 inhalation 2 times per day  4.  If needed:   A.  Nasal saline  B.  Cetirizine 10  mg - 1-2 times per day  C.  Auvi-Q 0.3, Benadryl, MD/ER evaluation for allergic reaction  D.  Systane eye drops multiple times per day  E.  OTC Pataday - 1 drop each eye 1 time per day  F.  Famotidine 40 mg - 1 tablet in PM  G. Airsupra - 2 inhalations every 6 hours (replaces albuterol)  5.  Influenza = Tamiflu. Covid = Paxlovid  6. Return to clinic in 8 weeks or earlier if problem. Taper medications???    Zenda appears to have recently sustained some form of infection giving rise to significant hyperreactivity associated with her lung function at for the next 2 months we will have her use some inhaled steroids and regroup with her at that point in time and I suspect we will probably be able to eliminate that medication at that point in time.  She can also use an anti-inflammatory rescue medicine should it be required as noted above.  She will remain on therapy directed against respiratory tract inflammation for her upper airway with a nasal steroid and continue on immunotherapy.  She no longer requires any therapy for reflux and she certainly has the option of restarting famotidine should it be required.  Laurette Schimke, MD Allergy / Immunology [facility]

## 2023-05-10 ENCOUNTER — Other Ambulatory Visit: Payer: Self-pay

## 2023-05-10 ENCOUNTER — Encounter: Payer: Self-pay | Admitting: Allergy and Immunology

## 2023-05-10 ENCOUNTER — Telehealth: Payer: Self-pay

## 2023-05-10 NOTE — Telephone Encounter (Signed)
 Per [employer] fax - DOB verified -   Patient's insurance does not cover Asmanex Twisthaler 110 mcg/act - covers the following:  Asmanex HFA AERO 100, 50 or 200 mcg/act   Forwarding message to provider for next step.

## 2023-05-10 NOTE — Progress Notes (Deleted)
 Office Visit Note  Patient: [name]             Date of Birth: 1970/02/24           MRN: [id number]             PCP: Myrlene Broker, MD Referring: Myrlene Broker, * Visit Date: 05/24/2023 Occupation: @GUAROCC @  Subjective:  No chief complaint on file.   History of Present Illness: Nellie Pester is a 53 y.o. female ***     Activities of Daily Living:  Patient reports morning stiffness for *** {minute/hour:19697}.   Patient {ACTIONS;DENIES/REPORTS:21021675::"Denies"} nocturnal pain.  Difficulty dressing/grooming: {ACTIONS;DENIES/REPORTS:21021675::"Denies"} Difficulty climbing stairs: {ACTIONS;DENIES/REPORTS:21021675::"Denies"} Difficulty getting out of chair: {ACTIONS;DENIES/REPORTS:21021675::"Denies"} Difficulty using hands for taps, buttons, cutlery, and/or writing: {ACTIONS;DENIES/REPORTS:21021675::"Denies"}  No Rheumatology ROS completed.   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   Low grade squamous intraepithelial lesion (LGSIL) on cervicovaginal cytologic smear 11/09/2022   Hair thinning 11/02/2022   Candidiasis 07/12/2022   Vaginal pain 07/12/2022   Overweight 06/28/2022   Pre-diabetes 06/28/2022   Asymptomatic varicose veins of both lower extremities 06/28/2022   Encounter for general adult medical examination with abnormal findings 06/28/2022   Idiopathic urticaria 02/11/2022   Seasonal and perennial allergic rhinitis  02/11/2022   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   Allergic conjunctivitis of both eyes 02/11/2022   Agatston CAC score, <100 12/27/2021   Elevated ALT measurement 07/08/2021   LPRD (laryngopharyngeal reflux disease) 07/08/2021   Irritable bowel syndrome with both constipation and diarrhea 07/08/2021   Chronic interstitial cystitis 10/14/2020   Tendinitis 10/14/2020   Mixed connective tissue disease (HCC) 10/02/2020   Primary osteoarthritis of both hands 10/02/2020   Acquired trigger finger of right middle finger 08/27/2020   Neck pain 06/23/2020   Cubital tunnel syndrome 10/09/2019   Lesion of ulnar nerve, right upper limb 10/09/2019   Generalized anxiety disorder 02/02/2018   Allergic urticaria 02/02/2018   Acquired hypothyroidism 02/02/2018   Elevated cholesterol 02/02/2018    Past Medical History:  Diagnosis Date   Allergy    Anxiety    Complication of anesthesia    " wakes up  crying"   Depression    Eczema    GERD (gastroesophageal reflux disease)    Hyperlipidemia    Hypothyroidism    Interstitial cystitis    Ligament tear    Left elbow   Menorrhagia    Migraines    Neuromuscular disorder (HCC)    PONV (postoperative nausea and vomiting)    likes phenergan and scopolamine patch   Tennis elbow    Tennis elbow    Thyroid disease    Trigger finger of right hand    Urticaria     Family History  Problem Relation Age of Onset   Thyroid disease Mother    Melanoma Mother    Colon polyps Father    Hypertension Father    Hyperlipidemia Father    Cancer Maternal Grandmother    Diabetes Maternal Grandmother  Hypertension Maternal Grandmother    Heart disease Maternal Grandfather    Hyperlipidemia Maternal Grandfather    Hypertension Maternal Grandfather    Diabetes Paternal Grandmother    Stroke Paternal Grandmother    Diabetes Paternal Grandfather    Heart disease Paternal Grandfather    Hyperlipidemia Paternal Grandfather    Hypertension Paternal  Grandfather    Colon cancer Neg Hx    Esophageal cancer Neg Hx    Stomach cancer Neg Hx    Rectal cancer Neg Hx    Past Surgical History:  Procedure Laterality Date   carpal tunnel release Bilateral 2010   tennis elbow also done   CESAREAN SECTION     x 2   CHOLECYSTECTOMY  2003   DIAGNOSTIC LAPAROSCOPY  20002   thru belly button to check for endometriosis   DILITATION & CURRETTAGE/HYSTROSCOPY WITH NOVASURE ABLATION N/A 11/18/2019   Procedure: DILATATION & CURETTAGE/HYSTEROSCOPY WITH NOVASURE ABLATION;  Surgeon: Olivia Mackie, MD;  Location: [facility];  Service: Gynecology;  Laterality: N/A;   ELBOW SURGERY     TRIGGER FINGER RELEASE Right    3rd and 4th digit   Social History   Social History Narrative   Not on file   Immunization History  Administered Date(s) Administered   Influenza, Seasonal, Injecte, Preservative Fre 11/01/2022   Influenza,inj,Quad PF,6+ Mos 11/17/2015, 11/18/2016, 12/08/2017, 12/14/2018   Influenza-Unspecified 12/08/2017, 12/11/2020, 12/17/2021   Moderna Sars-Covid-2 Vaccination 02/04/2020   PFIZER Comirnaty(Gray Top)Covid-19 Tri-Sucrose Vaccine 04/12/2019, 05/03/2019   Td 02/07/1997   Tdap 03/25/2009     Objective: Vital Signs: There were no vitals taken for this visit.   Physical Exam   Musculoskeletal Exam: ***  CDAI Exam: CDAI Score: -- Patient Global: --; Provider Global: -- Swollen: --; Tender: -- Joint Exam 05/24/2023   No joint exam has been documented for this visit   There is currently no information documented on the homunculus. Go to the Rheumatology activity and complete the homunculus joint exam.  Investigation: No additional findings.  Imaging: No results found.  Recent Labs: Lab Results  Component Value Date   WBC 7.8 02/10/2022   HGB 14.2 02/10/2022   PLT 313 02/10/2022   NA 139 11/01/2022   K 3.9 11/01/2022   CL 104 11/01/2022   CO2 28 11/01/2022   GLUCOSE 83 11/01/2022   BUN 15  11/01/2022   CREATININE 1.06 11/01/2022   BILITOT 0.3 11/01/2022   ALKPHOS 85 11/01/2022   AST 47 (H) 11/01/2022   ALT 43 (H) 11/01/2022   PROT 6.6 11/01/2022   ALBUMIN 4.2 11/01/2022   CALCIUM 9.0 11/01/2022   GFRAA 114 08/05/2020   QFTBGOLDPLUS NEGATIVE 05/04/2020    Speciality Comments: PLQ EYE EXAM 06/23/2020 WNL PLQ- , rash, sob,  Imuran-headache, abdominal pain,flu like symptoms 09/02/20 HRCT negative for ILD,08/13/20 ECHO cardio  Procedures:  No procedures performed Allergies: Augmentin [amoxicillin-pot clavulanate], Imuran [azathioprine], Nexlizet [bempedoic acid-ezetimibe], Other, Penicillins, Sulfa antibiotics, Vibramycin [doxycycline], Doxycycline calcium, Latex, Hycodan [hydrocodone bit-homatrop mbr], Metronidazole, Levaquin [levofloxacin], Nitrofurantoin, and Plaquenil [hydroxychloroquine]   Assessment / Plan:     Visit Diagnoses: No diagnosis found.  Orders: No orders of the defined types were placed in this encounter.  No orders of the defined types were placed in this encounter.   Face-to-face time spent with patient was *** minutes. Greater than 50% of time was spent in counseling and coordination of care.  Follow-Up Instructions: No follow-ups on file.   Ellen Henri, CMA  Note - This record has  been created using [employer].  Chart creation errors have been sought, but may not always  have been located. Such creation errors do not reflect on  the standard of medical care.

## 2023-05-10 NOTE — Telephone Encounter (Signed)
 Per Provider:  Asmanex 100 - 2 inhalations 2 times per day. Please inform patient.   Received fax requesting  PA for Asmanex Twisthaler 110 mcg/act  -  sent request to process PA for approval.  If PA is denied - medication will change as noted above.  Forwarding updated message to provider as update only.

## 2023-05-11 ENCOUNTER — Other Ambulatory Visit: Payer: Self-pay | Admitting: Cardiology

## 2023-05-11 DIAGNOSIS — R931 Abnormal findings on diagnostic imaging of heart and coronary circulation: Secondary | ICD-10-CM

## 2023-05-11 DIAGNOSIS — E7800 Pure hypercholesterolemia, unspecified: Secondary | ICD-10-CM

## 2023-05-11 DIAGNOSIS — I2510 Atherosclerotic heart disease of native coronary artery without angina pectoris: Secondary | ICD-10-CM

## 2023-05-12 MED ORDER — ASMANEX HFA 100 MCG/ACT IN AERO
2.0000 | INHALATION_SPRAY | Freq: Two times a day (BID) | RESPIRATORY_TRACT | 5 refills | Status: AC
Start: 2023-05-12 — End: ?

## 2023-05-12 NOTE — Addendum Note (Signed)
 Addended by: Dub Mikes on: 05/12/2023 03:09 PM   Modules accepted: Orders

## 2023-05-12 NOTE — Telephone Encounter (Signed)
 Patient came in as she was not able to get anyone on the phone. She was requesting information regarding her Asmanex inhaler Dr. Lucie Leather prescribed. I informed patient that there was a medication change due to insurance not covering the [employer] and informed her I would send in the Asmanex [facility] inhaler. Patient verbalized understanding and was appreciative for the update.

## 2023-05-24 ENCOUNTER — Ambulatory Visit: Payer: BC Managed Care – PPO | Admitting: Rheumatology

## 2023-05-24 DIAGNOSIS — E063 Autoimmune thyroiditis: Secondary | ICD-10-CM

## 2023-05-24 DIAGNOSIS — L500 Allergic urticaria: Secondary | ICD-10-CM

## 2023-05-24 DIAGNOSIS — Z840 Family history of diseases of the skin and subcutaneous tissue: Secondary | ICD-10-CM

## 2023-05-24 DIAGNOSIS — M65341 Trigger finger, right ring finger: Secondary | ICD-10-CM

## 2023-05-24 DIAGNOSIS — M7061 Trochanteric bursitis, right hip: Secondary | ICD-10-CM

## 2023-05-24 DIAGNOSIS — M797 Fibromyalgia: Secondary | ICD-10-CM

## 2023-05-24 DIAGNOSIS — R768 Other specified abnormal immunological findings in serum: Secondary | ICD-10-CM

## 2023-05-24 DIAGNOSIS — M65331 Trigger finger, right middle finger: Secondary | ICD-10-CM

## 2023-05-24 DIAGNOSIS — F411 Generalized anxiety disorder: Secondary | ICD-10-CM

## 2023-05-24 DIAGNOSIS — E039 Hypothyroidism, unspecified: Secondary | ICD-10-CM

## 2023-05-24 DIAGNOSIS — E7800 Pure hypercholesterolemia, unspecified: Secondary | ICD-10-CM

## 2023-05-24 DIAGNOSIS — Z8261 Family history of arthritis: Secondary | ICD-10-CM

## 2023-05-24 DIAGNOSIS — K9041 Non-celiac gluten sensitivity: Secondary | ICD-10-CM

## 2023-05-24 DIAGNOSIS — R5383 Other fatigue: Secondary | ICD-10-CM

## 2023-05-24 DIAGNOSIS — M19041 Primary osteoarthritis, right hand: Secondary | ICD-10-CM

## 2023-06-06 ENCOUNTER — Ambulatory Visit (INDEPENDENT_AMBULATORY_CARE_PROVIDER_SITE_OTHER): Payer: Self-pay

## 2023-06-06 DIAGNOSIS — J309 Allergic rhinitis, unspecified: Secondary | ICD-10-CM

## 2023-06-13 DIAGNOSIS — J3081 Allergic rhinitis due to animal (cat) (dog) hair and dander: Secondary | ICD-10-CM | POA: Diagnosis not present

## 2023-06-13 NOTE — Progress Notes (Signed)
VIALS MADE 06/13/23

## 2023-06-14 DIAGNOSIS — J302 Other seasonal allergic rhinitis: Secondary | ICD-10-CM

## 2023-07-06 ENCOUNTER — Ambulatory Visit: Admitting: Family

## 2023-07-06 ENCOUNTER — Encounter: Payer: Self-pay | Admitting: Family

## 2023-07-06 VITALS — BP 122/74 | HR 82 | Temp 98.1°F | Ht 66.25 in | Wt 152.6 lb

## 2023-07-06 DIAGNOSIS — R21 Rash and other nonspecific skin eruption: Secondary | ICD-10-CM | POA: Diagnosis not present

## 2023-07-06 MED ORDER — TRIAMCINOLONE ACETONIDE 0.5 % EX OINT: 1.0000 | TOPICAL_OINTMENT | Freq: Two times a day (BID) | CUTANEOUS | 0 refills | Status: AC

## 2023-07-06 MED ORDER — PREDNISONE 20 MG PO TABS: ORAL_TABLET | ORAL | 0 refills | Status: DC

## 2023-07-06 MED ORDER — METHYLPREDNISOLONE ACETATE 40 MG/ML IJ SUSP
40.0000 mg | Freq: Once | INTRAMUSCULAR | Status: AC
  Administered 2023-07-06: 40 mg via INTRAMUSCULAR

## 2023-07-06 NOTE — Progress Notes (Signed)
 Patient ID: [name], female    DOB: 25-Jan-1971, 53 y.o.   MRN: [id number]  Chief Complaint  Patient presents with   Rash    Pt c/o rash on bilateral legs, present since Friday. Has tried cortisone and benadryl which did resolved sx but returned last night.   Discussed the use of AI scribe software for clinical note transcription with the patient, who gave verbal consent to proceed.  History of Present Illness [name] is a 52 year old female with autoimmune issues and mold allergy  who presents with a painful rash and itching.  She experiences a painful, burning rash and itching primarily on the back of her right lower leg, described as feeling 'like it's on fire.' This is the second occurrence, with the first episode resolving with OTC Benadryl and cortisone cream. 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 She has a known mold allergy  and is undergoing immunotherapy. The building has water issues, and she was moving items stored for years, stirring up dust, which may have triggered her symptoms. She also feels itchy all over, including her face and head. Benadryl, two 25 mg tablets, has not been effective this time as well as taking Zyrtec  and Pepcid  bid. She experiences significant distress due to the itching and pain, impacting her ability to work and exercise. No history of shingles or vaccination for it. She also reports starting a new medication, Cymbalta , in April.  Assessment & Plan Contact dermatitis Acute contact dermatitis on bilateral lower legs with erythema, burning, itching, and pain due to irritant or allergen exposure. Differential includes shingles and systemic allergic reaction, but presentation aligns with contact dermatitis or possible heat rash. Benadryl ineffective. - Administer steroid injection, DepoMedrol 80mg  for quicker symptom relief. - Sending prednisone  to start in 2  days if rash is not improved or has worsened. - Prescribe steroid ointment for application twice daily for up to one week. - Continue Zyrtec  and Pepcid  bid.  - Advise monitoring for lesions indicative of shingles and report if any occur. - Call if rash worsens or does not improve after taking the prednisone .  Allergy  to mold Mold allergy  exacerbated by environmental exposure in school building with known mold issues, causing generalized itching and stress-related symptom exacerbation. Financial constraints limit access to regular allergy  and asthma specialist consultations. - Consider environmental modifications to reduce mold exposure. - Continue allergy  meds bid as discussed above.  Subjective:     Outpatient Medications Prior to Visit  Medication Sig Dispense Refill   Albuterol -Budesonide (AIRSUPRA ) 90-80 MCG/ACT AERO Inhale 2 Inhalations into the lungs every 4 (four) hours as needed. 11 g 1   cetirizine  (ZYRTEC ) 10 MG tablet Take 1 tablet (10 mg total) by mouth daily as needed for allergies (Can take an extra dose during flare ups.). 180 tablet 1   clindamycin  (CLINDAGEL) 1 % gel Apply 1 Application topically 2 (two) times daily.     Cranberry (ELLURA) 200 MG CAPS Take 1 capsule by mouth daily.     DULoxetine  (CYMBALTA ) 30 MG capsule Take 1 capsule (30 mg total) by mouth daily. 30 capsule 3   EPINEPHrine  (AUVI-Q ) 0.3 mg/0.3 mL IJ SOAJ injection Inject 0.3 mg into the muscle as needed for anaphylaxis. 2 each 2   famotidine  (PEPCID ) 40 MG tablet Take 1 tablet (40 mg total) by mouth at bedtime. 90 tablet 1   fluticasone  (FLONASE ) 50 MCG/ACT nasal spray Place 2  sprays into both nostrils every morning. 48 g 1   hydrocortisone  2.5 % cream Apply topically 2 (two) times daily as needed. 30 g 2   ketoconazole  (NIZORAL ) 2 % shampoo Apply 1 Application topically 2 (two) times a week.     levothyroxine  (SYNTHROID , LEVOTHROID) 100 MCG tablet Take 1 tablet (100 mcg total) by mouth daily. 90 tablet 3    Mometasone  Furoate (ASMANEX  HFA) 100 MCG/ACT AERO Inhale 2 puffs into the lungs in the morning and at bedtime. 13 g 5   nystatin  ointment (MYCOSTATIN ) SMARTSIG:1 Topical Daily     nystatin -triamcinolone  ointment (MYCOLOG) Apply topically.     Olopatadine  HCl (PATADAY ) 0.2 % SOLN Place 1 drop into both eyes every morning. 7.5 mL 1   Polyethyl Glycol-Propyl Glycol (SYSTANE) 0.4-0.3 % SOLN Apply 1 drop to eye every 4 (four) hours as needed. 90 mL 1   REPATHA  SURECLICK 140 MG/ML SOAJ ADMINISTER 1 ML UNDER THE SKIN EVERY 14 DAYS 6 mL 1   rosuvastatin  (CRESTOR ) 10 MG tablet TAKE 1 TABLET(10 MG) BY MOUTH DAILY 90 tablet 3   traZODone (DESYREL) 50 MG tablet Take 100 mg by mouth at bedtime.     albuterol  (VENTOLIN  HFA) 108 (90 Base) MCG/ACT inhaler Inhale 2 puffs into the lungs every 6 (six) hours as needed for wheezing or shortness of breath. (Patient not taking: Reported on 07/06/2023) 8 g 0   fluconazole  (DIFLUCAN ) 150 MG tablet Take by mouth. (Patient not taking: Reported on 07/06/2023)     No facility-administered medications prior to visit.   Past Medical History:  Diagnosis Date   Allergy     Anxiety    Complication of anesthesia    " wakes up  crying"   Depression    Eczema    GERD (gastroesophageal reflux disease)    Hyperlipidemia    Hypothyroidism    Interstitial cystitis    Ligament tear    Left elbow   Menorrhagia    Migraines    Neuromuscular disorder (HCC)    PONV (postoperative nausea and vomiting)    likes phenergan  and scopolamine  patch   Tennis elbow    Tennis elbow    Thyroid  disease    Trigger finger of right hand    Urticaria    Past Surgical History:  Procedure Laterality Date   carpal tunnel release Bilateral 2010   tennis elbow also done   CESAREAN SECTION     x 2   CHOLECYSTECTOMY  2003   DIAGNOSTIC LAPAROSCOPY  20002   thru belly button to check for endometriosis   DILITATION & CURRETTAGE/HYSTROSCOPY WITH NOVASURE ABLATION N/A 11/18/2019   Procedure:  DILATATION & CURETTAGE/HYSTEROSCOPY WITH NOVASURE ABLATION;  Surgeon: Meriam Stamp, MD;  Location: [facility];  Service: Gynecology;  Laterality: N/A;   ELBOW SURGERY     TRIGGER FINGER RELEASE Right    3rd and 4th digit   Allergies  Allergen Reactions   Augmentin  [Amoxicillin -Pot Clavulanate] Swelling    Pt has tolerated amoxicillin  prior Black rings around eyes   Imuran  [Azathioprine ]     Muscle aches, joint aches, fatigue, GI upset, itching, rash    Nexlizet  [Bempedoic Acid-Ezetimibe] Hives and Swelling   Other     Other reaction(s): Chest Pain   Penicillins     Other reaction(s): Eye Swelling, Headache   Sulfa Antibiotics Shortness Of Breath    Other reaction(s): Unknown   Vibramycin [Doxycycline]     Other reaction(s): Edema, Edema, Unknown   Doxycycline Calcium  Swelling  Retained fluid   Latex Rash   Hycodan [Hydrocodone  Bit-Homatrop Mbr]    Metronidazole  Other (See Comments)    metronidazole    Levaquin  [Levofloxacin ] Rash   Nitrofurantoin Rash   Plaquenil  [Hydroxychloroquine ] Rash      Objective:    Physical Exam Vitals and nursing note reviewed.  Constitutional:      Appearance: Normal appearance.  Cardiovascular:     Rate and Rhythm: Normal rate and regular rhythm.  Pulmonary:     Effort: Pulmonary effort is normal.     Breath sounds: Normal breath sounds.  Musculoskeletal:        General: Normal range of motion.  Skin:    General: Skin is warm and dry.     Findings: Rash (diffuse erythema, no lesions noted, not raised on bilateral lower legs; separate hive-like rash with erythema all across upper chest see diagram) present. Rash is urticarial.       Neurological:     Mental Status: She is alert.  Psychiatric:        Mood and Affect: Mood normal.        Behavior: Behavior normal.    BP 122/74 (BP Location: Left Arm, Patient Position: Sitting, Cuff Size: Large)   Pulse 82   Temp 98.1 F (36.7 C) (Temporal)   Ht 5' 6.25" (1.683  m)   Wt 152 lb 9.6 oz (69.2 kg)   SpO2 97%   BMI 24.44 kg/m  Wt Readings from Last 3 Encounters:  07/06/23 152 lb 9.6 oz (69.2 kg)  05/09/23 152 lb 11.2 oz (69.3 kg)  04/27/23 151 lb (68.5 kg)      Versa Gore, NP

## 2023-07-11 ENCOUNTER — Ambulatory Visit: Admitting: Family

## 2023-07-18 ENCOUNTER — Ambulatory Visit: Admitting: Internal Medicine

## 2023-07-24 ENCOUNTER — Ambulatory Visit (INDEPENDENT_AMBULATORY_CARE_PROVIDER_SITE_OTHER): Payer: Self-pay

## 2023-07-24 DIAGNOSIS — J309 Allergic rhinitis, unspecified: Secondary | ICD-10-CM

## 2023-07-31 ENCOUNTER — Ambulatory Visit: Payer: Self-pay | Admitting: Internal Medicine

## 2023-07-31 ENCOUNTER — Telehealth: Payer: Self-pay | Admitting: Allergy and Immunology

## 2023-07-31 ENCOUNTER — Ambulatory Visit (INDEPENDENT_AMBULATORY_CARE_PROVIDER_SITE_OTHER): Admitting: Internal Medicine

## 2023-07-31 ENCOUNTER — Encounter: Payer: Self-pay | Admitting: Internal Medicine

## 2023-07-31 VITALS — BP 118/80 | HR 91 | Temp 98.3°F | Ht 66.25 in | Wt 147.0 lb

## 2023-07-31 DIAGNOSIS — J028 Acute pharyngitis due to other specified organisms: Secondary | ICD-10-CM

## 2023-07-31 DIAGNOSIS — R6889 Other general symptoms and signs: Secondary | ICD-10-CM

## 2023-07-31 DIAGNOSIS — B9789 Other viral agents as the cause of diseases classified elsewhere: Secondary | ICD-10-CM

## 2023-07-31 LAB — POCT RAPID STREP A (OFFICE): Rapid Strep A Screen: NEGATIVE

## 2023-07-31 LAB — POC COVID19 BINAXNOW: SARS Coronavirus 2 Ag: NEGATIVE

## 2023-07-31 MED ORDER — DULOXETINE HCL 30 MG PO CPEP: 30.0000 mg | ORAL_CAPSULE | Freq: Every day | ORAL | 3 refills | Status: AC

## 2023-07-31 MED ORDER — FLUCONAZOLE 150 MG PO TABS: 150.0000 mg | ORAL_TABLET | Freq: Once | ORAL | 0 refills | Status: AC

## 2023-07-31 NOTE — Progress Notes (Unsigned)
   Subjective:   Patient ID: [name], female    DOB: Apr 14, 1970, 53 y.o.   MRN: [id number]  HPI The patient is a 53 YO female coming in for sore throat and fever. URI symptoms in the household recently. Has a cough as well.   Review of Systems  Objective:  Physical Exam  Vitals:   07/31/23 1411  BP: 118/80  Pulse: 91  Temp: 98.3 F (36.8 C)  TempSrc: Oral  SpO2: 96%  Weight: 147 lb (66.7 kg)  Height: 5' 6.25 (1.683 m)    Assessment & Plan:

## 2023-07-31 NOTE — Telephone Encounter (Signed)
 FYI Only or Action Required?: FYI only for provider.  Patient was last seen in primary care on 07/06/2023 by Lucius Krabbe, NP. Called Nurse Triage reporting Sore Throat. Symptoms began several days ago. Symptoms are: gradually improving.  Triage Disposition: See Physician Within 24 Hours  Patient/caregiver understands and will follow disposition?: Yes                        Copied from CRM [id number]. Topic: Clinical - Red Word Triage >> Jul 31, 2023 10:23 AM Powell HERO wrote: Red Word that prompted transfer to Nurse Triage: Patient has been sick and having pain in her throat and white blisters on her throat. She states she's been lightheaded and dizzy, also stating a very croupy cough. Last night had extreme body aches with 101 fever yesterday. Wants to be seen asap Reason for Disposition  SEVERE (e.g., excruciating) throat pain  Answer Assessment - Initial Assessment Questions Patient states her grandson lives with her and he was recently sick with body aches, a croupy cough. Pt husband then got sick with whatever her grandson had.  Patient got sick this past Friday with body aches, chest pain when breathing (not now), sore throat. Started with a headache on Wednesday Pt is not sure if its COVID or strep  White blister on back of throat- worried about strep, 5/10 pain level Had a fever- not currently Taking Tylenol   Pt states she had azithromycin  at home so she took two  Pt feels better this morning but still feels weak with a sore throat  Protocols used: Sore Throat-A-AH

## 2023-07-31 NOTE — Telephone Encounter (Signed)
 Patient stated she didn't get her prescription refilled from her previous visit on 04/01. Patient stated she needed her Zyrtec , Pepcid , and her Flonase . She wants that sent over to the [facility] on [address]  Best Contact: [phone]

## 2023-08-01 ENCOUNTER — Ambulatory Visit: Admitting: Allergy and Immunology

## 2023-08-01 NOTE — Telephone Encounter (Signed)
 I called the pharmacy and they stated that the Zyrtec  and Pepcid  will be ready for pick up tomorrow. Flonase  will be ready for pick up 10/05/23 due to it being too early to pick up at this time and will not be covered by her insurance.   Please inform patient if she calls the office back.

## 2023-08-01 NOTE — Telephone Encounter (Signed)
 I called the patient to inform her all her medications were sent in at her 05/09/23 appointment. I left a message for her to call the office back to see if she spoke to the pharmacy about her refills.

## 2023-08-03 DIAGNOSIS — B9789 Other viral agents as the cause of diseases classified elsewhere: Secondary | ICD-10-CM | POA: Insufficient documentation

## 2023-08-03 NOTE — Assessment & Plan Note (Signed)
 POC covid-19 and strep done and negative. Reassurance given. She does have signs of early yeast infection from recent antibiotics rx diflucan . Advised otc and timeline for improvement.

## 2023-08-14 ENCOUNTER — Ambulatory Visit: Payer: Self-pay | Admitting: *Deleted

## 2023-08-14 NOTE — Telephone Encounter (Signed)
  Requesting OV  Scheduled for tomorrow Last OV 07/31/23 Medications tried : hydrocortisone  cream      Copied from CRM [id number]. Topic: Clinical - Red Word Triage >> Aug 14, 2023  1:28 PM Robinson H wrote: [facility] that prompted transfer to Nurse Triage: Possible shingles, top of vagina, started burning and tingling feeling, rash and feels like pins sticking her, have been nausea and fatigue, painful as well. Reason for Disposition  [1] Localized rash is very painful AND [2] no fever  Answer Assessment - Initial Assessment Questions 1. APPEARANCE of RASH: Describe the rash.      Red raised and bumpy  2. LOCATION: Where is the rash located?      Top area of vagina  3. NUMBER: How many spots are there?      1 cluster  4. SIZE: How big are the spots? (Inches, centimeters or compare to size of a coin)      Golf ball size area 5. ONSET: When did the rash start?      Yesterday  6. ITCHING: Does the rash itch? If Yes, ask: How bad is the itch?  (Scale 0-10; or none, mild, moderate, severe)     Itching and burning  7. PAIN: Does the rash hurt? If Yes, ask: How bad is the pain?  (Scale 0-10; or none, mild, moderate, severe)    - NONE (0): no pain    - MILD (1-3): doesn't interfere with normal activities     - MODERATE (4-7): interferes with normal activities or awakens from sleep     - SEVERE (8-10): excruciating pain, unable to do any normal activities     Pain and achy  8. OTHER SYMPTOMS: Do you have any other symptoms? (e.g., fever)     Fluid filled blisters, burning like pins. Stung by yellow jackets Thursday and developed rash yesterday. Did try on bathing suits but had underware on as well over weekend. No fever no chest pain no difficulty breathing or swallowing. Reports feeling clammy at times.  9. PREGNANCY: Is there any chance you are pregnant? When was your last menstrual period?     Na   Appt scheduled tomorrow with other provider. 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 Please advise if PCP can see patient today at 4:30 pm ,due to she has a dental appt today for cleaning today . Recommended if sx worsen go to ED.  Protocols used: Rash or Redness - Localized-A-AH

## 2023-08-15 ENCOUNTER — Encounter: Payer: Self-pay | Admitting: Family Medicine

## 2023-08-15 ENCOUNTER — Ambulatory Visit: Admitting: Family Medicine

## 2023-08-15 VITALS — BP 126/70 | HR 82 | Temp 98.3°F | Ht 66.25 in | Wt 148.0 lb

## 2023-08-15 DIAGNOSIS — Z23 Encounter for immunization: Secondary | ICD-10-CM

## 2023-08-15 DIAGNOSIS — B0223 Postherpetic polyneuropathy: Secondary | ICD-10-CM

## 2023-08-15 DIAGNOSIS — T148XXA Other injury of unspecified body region, initial encounter: Secondary | ICD-10-CM

## 2023-08-15 MED ORDER — VALACYCLOVIR HCL 1 G PO TABS: 1000.0000 mg | ORAL_TABLET | Freq: Three times a day (TID) | ORAL | 0 refills | Status: AC

## 2023-08-15 MED ORDER — HYDROCORTISONE 2.5 % EX CREA: TOPICAL_CREAM | Freq: Two times a day (BID) | CUTANEOUS | 2 refills | Status: AC | PRN

## 2023-08-15 MED ORDER — METHYLPREDNISOLONE ACETATE 40 MG/ML IJ SUSP
40.0000 mg | Freq: Once | INTRAMUSCULAR | Status: AC
  Administered 2023-08-15: 40 mg via INTRAMUSCULAR

## 2023-08-15 MED ORDER — TRAMADOL HCL 50 MG PO TABS
50.0000 mg | ORAL_TABLET | Freq: Three times a day (TID) | ORAL | 0 refills | Status: AC | PRN
Start: 2023-08-15 — End: 2023-08-20

## 2023-08-15 NOTE — Patient Instructions (Addendum)
 I have sent in Valtrex  for you to take 1 tablet 3 times a day for 7 days.  You have received a steroid injection in the office today.  I have sent in Tramadol  for you to take one tablet every 6 hours as needed for pain  I have sent in hydrocortisone  2.5% cream for you to use twice a day as needed for rash, itching.  Follow-up with me for new or worsening symptoms.

## 2023-08-15 NOTE — Progress Notes (Signed)
 Acute Office Visit  Subjective:     Patient ID: [name], female    DOB: 1970/10/29, 53 y.o.   MRN: [id number]  Chief Complaint  Patient presents with   Acute Visit    HPI  53 year old female presents for evaluation of right upper thigh pain. Reports rash to right mons pubis.  Reports that the area is very tender, burns and itches.  Has not taken OTC treatment. Injury from 2 right posterior shoulder. She is not up-to-date on tetanus. Denies any bleeding or discharge from the area.    ROS Per HPI      Objective:    BP 126/70 (BP Location: Left Arm, Patient Position: Sitting)   Pulse 82   Temp 98.3 F (36.8 C) (Temporal)   Ht 5' 6.25 (1.683 m)   Wt 148 lb (67.1 kg)   SpO2 96%   BMI 23.71 kg/m    Physical Exam Vitals and nursing note reviewed.  Constitutional:      General: She is not in acute distress.    Appearance: Normal appearance. She is normal weight.  HENT:     Head: Normocephalic and atraumatic.     Right Ear: External ear normal.     Left Ear: External ear normal.     Nose: Nose normal.     Mouth/Throat:     Mouth: Mucous membranes are moist.     Pharynx: Oropharynx is clear.  Eyes:     Extraocular Movements: Extraocular movements intact.     Pupils: Pupils are equal, round, and reactive to light.  Cardiovascular:     Rate and Rhythm: Normal rate and regular rhythm.     Pulses: Normal pulses.     Heart sounds: Normal heart sounds.  Pulmonary:     Effort: Pulmonary effort is normal. No respiratory distress.     Breath sounds: Normal breath sounds. No wheezing, rhonchi or rales.  Musculoskeletal:        General: Normal range of motion.     Cervical back: Normal range of motion.     Right lower leg: No edema.     Left lower leg: No edema.  Lymphadenopathy:     Cervical: No cervical adenopathy.  Skin:    Findings: Rash present. No lesion.         Comments: Erythematous, tender vesicular rash noted to the area.  No draining, no  bleeding.  Area consistent with varicella-zoster  Abrasion to right posterior shoulder.  No erythema, no bleeding, no discharge, nontender  Neurological:     General: No focal deficit present.     Mental Status: She is alert and oriented to person, place, and time.  Psychiatric:        Mood and Affect: Mood normal.        Thought Content: Thought content normal.     No results found for any visits on 08/15/23.      Assessment & Plan:    Shingles (herpes zoster) polyneuropathy -     valACYclovir  HCl; Take 1 tablet (1,000 mg total) by mouth 3 (three) times daily for 7 days.  Dispense: 21 tablet; Refill: 0 -     Hydrocortisone ; Apply topically 2 (two) times daily as needed.  Dispense: 30 g; Refill: 2 -     traMADol  HCl; Take 1 tablet (50 mg total) by mouth every 8 (eight) hours as needed for up to 5 days.  Dispense: 15 tablet; Refill: 0 -     methylPREDNISolone  Acetate  Need for tetanus, diphtheria, and acellular pertussis (Tdap) vaccine -     Tdap vaccine greater than or equal to 7yo IM  Abrasion     Meds ordered this encounter  Medications   valACYclovir  (VALTREX ) 1000 MG tablet    Sig: Take 1 tablet (1,000 mg total) by mouth 3 (three) times daily for 7 days.    Dispense:  21 tablet    Refill:  0   hydrocortisone  2.5 % cream    Sig: Apply topically 2 (two) times daily as needed.    Dispense:  30 g    Refill:  2   traMADol  (ULTRAM ) 50 MG tablet    Sig: Take 1 tablet (50 mg total) by mouth every 8 (eight) hours as needed for up to 5 days.    Dispense:  15 tablet    Refill:  0   methylPREDNISolone  acetate (DEPO-MEDROL ) injection 40 mg    Return if symptoms worsen or fail to improve.  Corean LITTIE Ku, FNP

## 2023-08-20 DIAGNOSIS — Z23 Encounter for immunization: Secondary | ICD-10-CM | POA: Insufficient documentation

## 2023-08-20 DIAGNOSIS — B0223 Postherpetic polyneuropathy: Secondary | ICD-10-CM | POA: Insufficient documentation

## 2023-08-20 DIAGNOSIS — T148XXA Other injury of unspecified body region, initial encounter: Secondary | ICD-10-CM | POA: Insufficient documentation

## 2023-08-23 ENCOUNTER — Ambulatory Visit: Payer: Self-pay | Admitting: Rheumatology

## 2023-09-07 ENCOUNTER — Ambulatory Visit: Payer: Self-pay | Admitting: Rheumatology

## 2023-10-26 ENCOUNTER — Telehealth: Payer: Self-pay | Admitting: Cardiology

## 2023-10-26 ENCOUNTER — Ambulatory Visit (INDEPENDENT_AMBULATORY_CARE_PROVIDER_SITE_OTHER)

## 2023-10-26 DIAGNOSIS — I2510 Atherosclerotic heart disease of native coronary artery without angina pectoris: Secondary | ICD-10-CM

## 2023-10-26 DIAGNOSIS — J309 Allergic rhinitis, unspecified: Secondary | ICD-10-CM | POA: Diagnosis not present

## 2023-10-26 DIAGNOSIS — E7800 Pure hypercholesterolemia, unspecified: Secondary | ICD-10-CM

## 2023-10-26 DIAGNOSIS — R931 Abnormal findings on diagnostic imaging of heart and coronary circulation: Secondary | ICD-10-CM

## 2023-10-26 NOTE — Telephone Encounter (Signed)
*  STAT* If patient is at the pharmacy, call can be transferred to refill team.   1. Which medications need to be refilled? (please list name of each medication and dose if known) REPATHA  SURECLICK 140 MG/ML SOAJ    2. Would you like to learn more about the convenience, safety, & potential cost savings by using the [facility]? no   3. Are you open to using the [facility] (Type [facility].  ).no   4. Which pharmacy/location (including street and city if local pharmacy) is medication to be sent to? [id number] - [place], [place] - [address]    5. Do they need a 30 day or 90 day supply? 30 day   Pt has scheduled appt 12/17

## 2023-10-27 ENCOUNTER — Other Ambulatory Visit: Payer: Self-pay | Admitting: Allergy and Immunology

## 2023-10-30 MED ORDER — REPATHA SURECLICK 140 MG/ML ~~LOC~~ SOAJ: 140.0000 mg | SUBCUTANEOUS | 0 refills | Status: DC

## 2023-11-13 ENCOUNTER — Ambulatory Visit (HOSPITAL_BASED_OUTPATIENT_CLINIC_OR_DEPARTMENT_OTHER): Admitting: Cardiology

## 2023-11-14 ENCOUNTER — Encounter: Payer: Self-pay | Admitting: Family Medicine

## 2023-11-14 ENCOUNTER — Ambulatory Visit (INDEPENDENT_AMBULATORY_CARE_PROVIDER_SITE_OTHER): Admitting: Family Medicine

## 2023-11-14 VITALS — BP 120/78 | HR 87 | Temp 98.8°F | Ht 66.25 in | Wt 153.2 lb

## 2023-11-14 DIAGNOSIS — B0223 Postherpetic polyneuropathy: Secondary | ICD-10-CM

## 2023-11-14 DIAGNOSIS — F5101 Primary insomnia: Secondary | ICD-10-CM | POA: Diagnosis not present

## 2023-11-14 DIAGNOSIS — M797 Fibromyalgia: Secondary | ICD-10-CM | POA: Diagnosis not present

## 2023-11-14 DIAGNOSIS — N951 Menopausal and female climacteric states: Secondary | ICD-10-CM

## 2023-11-14 MED ORDER — METHYLPREDNISOLONE ACETATE 40 MG/ML IJ SUSP
40.0000 mg | Freq: Once | INTRAMUSCULAR | Status: AC
  Administered 2023-11-14: 40 mg via INTRAMUSCULAR

## 2023-11-14 MED ORDER — TRAMADOL HCL 50 MG PO TABS: 50.0000 mg | ORAL_TABLET | Freq: Three times a day (TID) | ORAL | 0 refills | Status: AC | PRN

## 2023-11-14 MED ORDER — VALACYCLOVIR HCL 1 G PO TABS: 1000.0000 mg | ORAL_TABLET | Freq: Three times a day (TID) | ORAL | 0 refills | Status: AC

## 2023-11-14 NOTE — Patient Instructions (Signed)
 You have received a steroid injection in the office today.  Refilled valtrex  and tramadol .   Follow-up with me for new or worsening symptoms.

## 2023-11-14 NOTE — Progress Notes (Signed)
 Acute Office Visit  Subjective:     Patient ID: [name], female    DOB: 1970-12-24, 53 y.o.   MRN: [id number]  Chief Complaint  Patient presents with   Rash    HPI  Discussed the use of AI scribe software for clinical note transcription with the patient, who gave verbal consent to proceed.  History of Present Illness [name] is a 53 year old female with a history of shingles who presents with a suspected shingles flare-up affecting her face and eye.  Facial and ocular rash with neuropathic symptoms - Suspected shingles flare-up began after attending an out-of-town wedding - Initial lesion appeared on face on September 28th, initially thought to be a mosquito bite - Lesions increased to three or four by the following morning - Dermatologist prescribed Valtrex ; diagnosis uncertain between dermatitis and shingles - Completed Valtrex  course last week, but symptoms persist - Current symptoms: stinging sensation in the corner of the eye, facial redness and swelling - Sensation described as 'something sticking in my skin', disrupting sleep - Concern for potential spread to the eye  Cutaneous and neuropathic symptoms on trunk and scalp - History of similar rash on back and shoulder, without blistering but with tingling sensations - Intermittent tingling up the neck, described as 'ants going up my neck' - Dermatitis on scalp treated with prescribed shampoo, resulting in symptom improvement  Menopausal symptoms - Experiencing hot flashes with facial flushing ('completely red') - Sore throat and severe nausea, initially attributed to Valtrex  or excessive alcohol consumption - Currently undergoing menopause - Taking progesterone and estrogen, which have improved menopausal symptoms  Chronic pain and sleep disturbance - History of fibromyalgia, currently managed with Cymbalta , which has been beneficial - Attempting to reduce trazodone dosage - Difficulty  sleeping due to stinging sensation from facial rash  Psychosocial stressors - 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  Constitutional:      General: She is not in acute distress.    Appearance: Normal appearance. She is normal weight.  HENT:     Head: Normocephalic and atraumatic.     Right Ear: External ear normal.     Left Ear: External ear normal.     Nose: Nose normal.     Mouth/Throat:     Mouth: Mucous membranes are moist.     Pharynx: Oropharynx is clear.  Eyes:     Extraocular Movements: Extraocular movements intact.     Pupils: Pupils are equal, round, and reactive to light.  Cardiovascular:     Rate and Rhythm: Normal rate and regular rhythm.     Pulses: Normal pulses.     Heart sounds: Normal heart sounds.  Pulmonary:     Effort: Pulmonary effort is normal. No respiratory distress.     Breath sounds: Normal breath sounds. No wheezing, rhonchi or rales.  Musculoskeletal:        General: Normal range of motion.     Cervical back: Normal range of motion.  Right lower leg: No edema.     Left lower leg: No edema.  Lymphadenopathy:     Cervical: No cervical adenopathy.  Skin:    Findings: Rash present.     Comments: Vesicular rash to right cheek, no open blisters. Mildly tender, no discharge,  Neurological:     General: No focal deficit present.     Mental Status: She is alert and oriented to person, place, and time.  Psychiatric:        Mood and Affect: Mood normal.        Thought Content: Thought content normal.     No results found for any visits on 11/14/23.      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 Concerns about ocular involvement. Gabapentin not tolerated. - Administer steroid injection. - Prescribe tramadol  for pain. - Advise over-the-counter cortisone cream with Vaseline or Aquaphor for face. - Instruct to monitor for new blisters near the eye and consult an eye doctor if she appears. - Refill Valtrex  for potential future use.  Fibromyalgia Chronic condition with inflammatory components. Cymbalta  beneficial. - Consider increasing Cymbalta  to 60 mg if trazodone is decreased.  Insomnia Difficulty sleeping due to nerve pain and trazodone reduction. - Continue trazodone at effective dose. - Consider adjusting Cymbalta  dosage.  Menopausal symptoms  On hormone therapy Hot flashes and flushing managed with hormone therapy. - Continue current hormone therapy regimen.     No orders of the defined types were placed in this encounter.    Meds ordered this encounter  Medications   traMADol  (ULTRAM ) 50 MG tablet    Sig: Take 1 tablet (50 mg total) by mouth every 8 (eight) hours as needed for up to 5 days.    Dispense:  15 tablet    Refill:  0   methylPREDNISolone  acetate (DEPO-MEDROL ) injection 40 mg   valACYclovir  (VALTREX ) 1000 MG tablet    Sig: Take 1 tablet (1,000 mg total) by mouth 3 (three) times daily for 7 days.    Dispense:  21 tablet    Refill:  0    Return if symptoms worsen or fail to improve.  Corean LITTIE Ku, FNP

## 2023-11-29 ENCOUNTER — Other Ambulatory Visit (HOSPITAL_BASED_OUTPATIENT_CLINIC_OR_DEPARTMENT_OTHER): Payer: Self-pay | Admitting: Cardiology

## 2023-11-30 ENCOUNTER — Other Ambulatory Visit (HOSPITAL_BASED_OUTPATIENT_CLINIC_OR_DEPARTMENT_OTHER): Payer: Self-pay | Admitting: Cardiology

## 2023-12-13 LAB — HM MAMMOGRAPHY

## 2024-01-08 ENCOUNTER — Encounter: Payer: Self-pay | Admitting: Family Medicine

## 2024-01-08 ENCOUNTER — Ambulatory Visit: Admitting: Family Medicine

## 2024-01-08 VITALS — BP 122/75 | HR 94 | Temp 98.0°F | Ht 66.0 in | Wt 162.4 lb

## 2024-01-08 DIAGNOSIS — R21 Rash and other nonspecific skin eruption: Secondary | ICD-10-CM | POA: Diagnosis not present

## 2024-01-08 DIAGNOSIS — M546 Pain in thoracic spine: Secondary | ICD-10-CM | POA: Diagnosis not present

## 2024-01-08 DIAGNOSIS — N3010 Interstitial cystitis (chronic) without hematuria: Secondary | ICD-10-CM

## 2024-01-08 DIAGNOSIS — B0223 Postherpetic polyneuropathy: Secondary | ICD-10-CM | POA: Diagnosis not present

## 2024-01-08 DIAGNOSIS — N951 Menopausal and female climacteric states: Secondary | ICD-10-CM

## 2024-01-08 DIAGNOSIS — F5101 Primary insomnia: Secondary | ICD-10-CM

## 2024-01-08 DIAGNOSIS — M797 Fibromyalgia: Secondary | ICD-10-CM

## 2024-01-08 DIAGNOSIS — N760 Acute vaginitis: Secondary | ICD-10-CM

## 2024-01-08 LAB — COMPREHENSIVE METABOLIC PANEL WITH GFR
ALT: 70 U/L — ABNORMAL HIGH (ref 0–35)
AST: 47 U/L — ABNORMAL HIGH (ref 0–37)
Albumin: 4.7 g/dL (ref 3.5–5.2)
Alkaline Phosphatase: 83 U/L (ref 39–117)
BUN: 12 mg/dL (ref 6–23)
CO2: 30 meq/L (ref 19–32)
Calcium: 9.6 mg/dL (ref 8.4–10.5)
Chloride: 101 meq/L (ref 96–112)
Creatinine, Ser: 0.8 mg/dL (ref 0.40–1.20)
GFR: 83.26 mL/min (ref >60.00)
Glucose, Bld: 78 mg/dL (ref 70–99)
Potassium: 3.5 meq/L (ref 3.5–5.1)
Sodium: 140 meq/L (ref 135–145)
Total Bilirubin: 0.4 mg/dL (ref 0.2–1.2)
Total Protein: 7.3 g/dL (ref 6.0–8.3)

## 2024-01-08 LAB — CBC WITH DIFFERENTIAL/PLATELET
Basophils Absolute: 0.1 K/uL (ref 0.0–0.1)
Basophils Relative: 1 % (ref 0.0–3.0)
Eosinophils Absolute: 0.1 K/uL (ref 0.0–0.7)
Eosinophils Relative: 1.3 % (ref 0.0–5.0)
HCT: 42.2 % (ref 36.0–46.0)
Hemoglobin: 14.4 g/dL (ref 12.0–15.0)
Lymphocytes Relative: 27.5 % (ref 12.0–46.0)
Lymphs Abs: 1.9 K/uL (ref 0.7–4.0)
MCHC: 34.2 g/dL (ref 30.0–36.0)
MCV: 91.6 fl (ref 78.0–100.0)
Monocytes Absolute: 0.6 K/uL (ref 0.1–1.0)
Monocytes Relative: 9.3 % (ref 3.0–12.0)
Neutro Abs: 4.2 K/uL (ref 1.4–7.7)
Neutrophils Relative %: 60.9 % (ref 43.0–77.0)
Platelets: 253 K/uL (ref 150.0–400.0)
RBC: 4.61 Mil/uL (ref 3.87–5.11)
RDW: 13.5 % (ref 11.5–15.5)
WBC: 7 K/uL (ref 4.0–10.5)

## 2024-01-08 LAB — C-REACTIVE PROTEIN: CRP: <0.5 mg/dL (ref 0.5–20.0)

## 2024-01-08 LAB — SEDIMENTATION RATE: Sed Rate: 11 mm/h (ref 0–30)

## 2024-01-08 MED ORDER — TRAMADOL HCL 50 MG PO TABS: 50.0000 mg | ORAL_TABLET | Freq: Three times a day (TID) | ORAL | 0 refills | Status: AC | PRN

## 2024-01-08 MED ORDER — METHYLPREDNISOLONE ACETATE 80 MG/ML IJ SUSP
80.0000 mg | Freq: Once | INTRAMUSCULAR | Status: AC
  Administered 2024-01-08: 80 mg via INTRAMUSCULAR

## 2024-01-08 MED ORDER — VALACYCLOVIR HCL 1 G PO TABS: 1000.0000 mg | ORAL_TABLET | Freq: Three times a day (TID) | ORAL | 0 refills | Status: DC

## 2024-01-08 NOTE — Patient Instructions (Addendum)
 [facility] [phone] (Dr Dannielle, Dr Mat, Dr Marne)  Boric Life Boric acid Finis) May use daily with flare up with vaginal yeast or bacteria for 7 days May use twice a week for maintenance  You have received a steroid injection in the office today.  Refilled valtrex   Refilled tramadol   We are checking labs today, will be in contact with any results that require further attention  Follow-up with me for new or worsening symptoms.

## 2024-01-08 NOTE — Progress Notes (Signed)
 Acute Office Visit  Subjective:     Patient ID: [name], female    DOB: [birth year], 53 y.o.   MRN: [id number]  Chief Complaint  Patient presents with   Follow-up    Shingles outbreak on top of vagina on right shoulder, face and back ongoing since Wednesday morning  Been taking valtrex  since Wednesday     HPI  Discussed the use of AI scribe software for clinical note transcription with the patient, who gave verbal consent to proceed.  History of Present Illness [name] is a 53 year old female who presents with severe back pain and nausea following hormone patch use.  Severe back pain and nausea associated with hormone patch - Severe back pain and intense nausea began after switching from oral estrogen and progesterone to a hormone patch. - Nausea is accompanied by fluid retention and difficulty urinating. - Symptoms worsen on patch change days, occurring twice weekly. - Vomiting occurred on Saturday; nausea improved after patch removal, but back pain persisted.  Neuropathic pain and rash consistent with shingles - Tingling sensation originates in the neck and radiates to the shoulder. - Associated with a shingles outbreak during Thanksgiving. - Rash appeared in the middle of her back, differing from previous unilateral outbreaks. - Valtrex  was initiated immediately, preventing worsening of symptoms. - Pain is severe, rated 8/10, primarily in the back and shoulder, radiating around the chest. - Describes pain as a sensation of being hit with a board and a stinging feeling.  Fibromyalgia and mixed connective tissue disease - Previously diagnosed with mixed connective tissue disease, re-diagnosed as fibromyalgia. - Symptoms managed with trazodone and Cymbalta . - Recently reduced trazodone dosage and considering increasing Cymbalta  for improved symptom control.  Interstitial cystitis and urinary symptoms - Interstitial cystitis managed by avoiding  acidic foods. - Takes a low-dose antibiotic after intercourse to prevent urinary tract infections. - Difficulty urinating noted in association with hormone patch use.  Psychosocial stressors - Experiencing stress related to work and family responsibilities.  Constitutional and respiratory symptoms - No recent fever or respiratory symptoms.     ROS Per HPI      Objective:    BP 122/75 (BP Location: Right Arm, Patient Position: Sitting)   Pulse 94   Temp 98 F (36.7 C) (Temporal)   Ht 5' 6 (1.676 m)   Wt 162 lb 6.4 oz (73.7 kg)   SpO2 99%   BMI 26.21 kg/m    Physical Exam Vitals and nursing note reviewed. Chaperone present: Rosina Ahle.  Constitutional:      General: She is not in acute distress.    Appearance: Normal appearance. She is normal weight.  HENT:     Head: Normocephalic and atraumatic.     Right Ear: External ear normal.     Left Ear: External ear normal.     Nose: Nose normal.     Mouth/Throat:     Mouth: Mucous membranes are moist.     Pharynx: Oropharynx is clear.  Eyes:     Extraocular Movements: Extraocular movements intact.     Pupils: Pupils are equal, round, and reactive to light.  Cardiovascular:     Rate and Rhythm: Normal rate and regular rhythm.     Pulses: Normal pulses.     Heart sounds: Normal heart sounds.  Pulmonary:     Effort: Pulmonary effort is normal. No respiratory distress.     Breath sounds: Normal breath sounds. No wheezing, rhonchi or rales.  Musculoskeletal:  General: Normal range of motion.     Cervical back: Normal range of motion.     Right lower leg: No edema.     Left lower leg: No edema.  Lymphadenopathy:     Cervical: No cervical adenopathy.  Skin:        Comments: Area to mons pubis with maculopapular erythematous rash. No bleeding, no discharge, no vesicles. Area to posterior L shoulder with erythematous vesicles present. No bleeding, no discharge  Neurological:     General: No focal deficit  present.     Mental Status: She is alert and oriented to person, place, and time.  Psychiatric:        Mood and Affect: Mood normal.        Thought Content: Thought content normal.     No results found for any visits on 01/08/24.      Assessment & Plan:   Assessment and Plan Assessment & Plan Shingles polyneuropathy, recurrent shingles, right thoracic back and shoulder pain Recurrent shingles with severe thoracic pain, tingling, and nausea. Central rash atypical for shingles, differential includes herpes simplex. Nausea and vomiting possibly linked to hormone patch and shingles. - Ordered CBC, metabolic panel, liver and kidney function tests, herpes simplex and herpes zoster blood tests. - Administered steroid shot for pain relief. - Refilled Valtrex  prescription. - Prescribed tramadol  for pain management. - Consider prophylactic Valtrex  500 mg daily if herpes simplex confirmed.  Menopausal symptoms with hormone therapy adjustment Symptoms worsened by switch from oral estrogen to patch, causing nausea and retention issues. Improvement noted after discontinuing patch. Oral estrogen preferred. - Resume oral estrogen pills starting Wednesday. - Discontinue estrogen patch. - Provided referrals for menopause management specialists.  Fibromyalgia Symptoms worsened by shingles outbreak. Current management includes Cymbalta  and trazodone. Considering Cymbalta  dosage increase. - Continue Cymbalta  and trazodone. - Consider increasing Cymbalta  dosage if symptoms persist.  Insomnia Managed with trazodone. Recent exacerbation possibly related to shingles and hormone therapy changes. - Continue trazodone for insomnia management.  Interstitial cystitis Managed with dietary modifications and low-dose macrobid. - Continue dietary modifications to avoid triggers.  Recurrent vaginitis Recurrent vulvovaginal candidiasis and bacterial vaginosis Recurrent infections possibly linked to  hormonal fluctuations and shingles. - Consider boric acid vaginal suppositories for maintenance therapy.     Orders Placed This Encounter  Procedures   CBC with Differential/Platelet    Release to patient:   Immediate [1]   Comprehensive metabolic panel with GFR    Release to patient:   Immediate [1]   Sedimentation rate   C-reactive protein   Varicella Zoster Abs, IgG/IgM   HSV 1 and 2 Ab, IgG   HSV 1/2 Ab (IgM), IFA w/rflx Titer     Meds ordered this encounter  Medications   valACYclovir  (VALTREX ) 1000 MG tablet    Sig: Take 1 tablet (1,000 mg total) by mouth 3 (three) times daily for 7 days.    Dispense:  21 tablet    Refill:  0   traMADol  (ULTRAM ) 50 MG tablet    Sig: Take 1 tablet (50 mg total) by mouth every 8 (eight) hours as needed for up to 7 days.    Dispense:  20 tablet    Refill:  0   methylPREDNISolone  acetate (DEPO-MEDROL ) injection 80 mg    Return if symptoms worsen or fail to improve.  Corean LITTIE Ku, FNP

## 2024-01-09 ENCOUNTER — Ambulatory Visit: Payer: Self-pay | Admitting: Family Medicine

## 2024-01-09 ENCOUNTER — Encounter: Payer: Self-pay | Admitting: Allergy and Immunology

## 2024-01-09 ENCOUNTER — Ambulatory Visit: Admitting: Allergy and Immunology

## 2024-01-09 ENCOUNTER — Other Ambulatory Visit: Payer: Self-pay

## 2024-01-09 VITALS — BP 130/82 | HR 82 | Temp 98.3°F | Ht 66.25 in | Wt 159.3 lb

## 2024-01-09 DIAGNOSIS — B009 Herpesviral infection, unspecified: Secondary | ICD-10-CM

## 2024-01-09 DIAGNOSIS — K219 Gastro-esophageal reflux disease without esophagitis: Secondary | ICD-10-CM

## 2024-01-09 DIAGNOSIS — B0229 Other postherpetic nervous system involvement: Secondary | ICD-10-CM

## 2024-01-09 DIAGNOSIS — L501 Idiopathic urticaria: Secondary | ICD-10-CM

## 2024-01-09 DIAGNOSIS — J4530 Mild persistent asthma, uncomplicated: Secondary | ICD-10-CM

## 2024-01-09 DIAGNOSIS — J3089 Other allergic rhinitis: Secondary | ICD-10-CM

## 2024-01-09 LAB — VARICELLA ZOSTER ABS, IGG/IGM
Varicella IgM: <0.91 {index} (ref 0.00–0.90)
Varicella zoster IgG: REACTIVE

## 2024-01-09 LAB — HSV 1 AND 2 AB, IGG
HSV 1 Glycoprotein G Ab, IgG: REACTIVE — AB
HSV 2 IgG, Type Spec: NONREACTIVE

## 2024-01-09 LAB — TIQ- AMBIGUOUS ORDER

## 2024-01-09 NOTE — Patient Instructions (Addendum)
  1.  Continue to perform Allergen avoidance measures as best as possible - dog / mold  2. Continue to Treat and prevent inflammation:   A.  Flonase  1-2 sprays each nostril daily  B.  Immunotherapy - dog / mold  3. Prevent immune activation:   A. Cetirizine  10 mg - 1 tablet 1-2 times per day  B. Famotidine  40 mg - 1 tablet 1 time per day  4.  If needed:   A.  Nasal saline  B.  Auvi-Q  0.3 / Epi-Pen, Benadryl, MD/ER evaluation for allergic reaction  C.  Systane eye drops multiple times per day  D.  OTC Pataday  - 1 drop each eye 1 time per day  E.  Airsupra  - 2 inhalations every 6 hours (replaces albuterol )  5.  Influenza = Tamiflu . Covid = Paxlovid  6. Return to clinic in 6 months or earlier if problem. Taper medications???  7. Recurrent shingles??? Other dermatitis??? Daily Valtrex ???

## 2024-01-09 NOTE — Progress Notes (Unsigned)
 [place] - [place] - [place] - [place] - [place]   Follow-up Note  Referring Provider: Rollene Almarie LABOR, * Primary Provider: Rollene Almarie LABOR, MD Date of Office Visit: 01/09/2024  Subjective:   [name] (DOB: 09-15-1970) is a 53 y.o. female who [facility] in re-evaluation of the following:  HPI: [name] returns to this clinic in reevaluation of asthma, allergic rhinoconjunctivitis, history of inflammatory dermatosis, history of urticaria, history of reflux, history of adverse response to wheat and corn consumption.  I last saw her in this clinic 09 May 2023.  During her last visit she appeared to be having some problems with asthma and we had her use Asmanex  which she did so until July 2025 at which point in time she stabilized regarding her asthma and discontinued this agent and she has done very well with her asthma since that point in time with rare requirement for short acting bronchodilator no limitation on ability to exercise and no need for any systemic steroids.  And she is done pretty well with her upper airway as well while continuing on Flonase  and using immunotherapy.  But she discontinued her immunotherapy in July 2025 because of issues with shingles as described below.  However, she has apparently had 3 episodes of shingles involving her right shoulder, right inguinal area, right face, right posterior neck.  She shows me pictures and indeed she has a papulovesicular eruption.  She apparently was treated with a systemic steroid for each 1 of these episodes along with Valtrex .  Her last outbreak appeared to occur last week.  She has seen dermatology as well as a primary care doctor for this issue.  Each of these flareups appear to have a significant stressor that occurred prior to these outbreaks.  She either was stung by a bee and then 1 week later developed an outbreak, used immunotherapy and 1 week later  developed an outbreak, used a used a hormone patch and 1 week later had an outbreak.  She now has some lingering and persistence tingling feeling of her right scapular area.  Allergies as of 01/09/2024       Reactions   Augmentin  [amoxicillin -pot Clavulanate] Swelling   Pt has tolerated amoxicillin  prior Black rings around eyes   Imuran  [azathioprine ]    Muscle aches, joint aches, fatigue, GI upset, itching, rash    Nexlizet  [bempedoic Acid-ezetimibe] Hives, Swelling   Other    Other reaction(s): Chest Pain   Penicillins    Other reaction(s): Eye Swelling, Headache   Sulfa Antibiotics Shortness Of Breath   Other reaction(s): Unknown   Vibramycin [doxycycline]    Other reaction(s): Edema, Edema, Unknown   Doxycycline Calcium  Swelling   Retained fluid   Latex Rash   Hycodan [hydrocodone  Bit-homatrop Mbr]    Metronidazole  Other (See Comments)   metronidazole    Levaquin  [levofloxacin ] Rash   Nitrofurantoin Rash   Plaquenil  [hydroxychloroquine ] Rash        Medication List    Airsupra  90-80 MCG/ACT Aero Generic drug: Albuterol -Budesonide Inhale 2 Inhalations into the lungs every 4 (four) hours as needed.   albuterol  108 (90 Base) MCG/ACT inhaler Commonly known as: VENTOLIN  HFA Inhale 2 puffs into the lungs every 6 (six) hours as needed for wheezing or shortness of breath.   Asmanex  HFA 100 MCG/ACT Aero Generic drug: Mometasone  Furoate Inhale 2 puffs into the lungs in the morning and at bedtime.   cetirizine  10 MG tablet Commonly known as: ZYRTEC   Take 1 tablet (10 mg total) by mouth daily as needed for allergies (Can take an extra dose during flare ups.).   clindamycin  1 % gel Commonly known as: CLINDAGEL Apply 1 Application topically 2 (two) times daily.   DULoxetine  30 MG capsule Commonly known as: Cymbalta  Take 1 capsule (30 mg total) by mouth daily.   Ellura 200 MG Caps Generic drug: Cranberry Take 1 capsule by mouth daily.   EPINEPHrine  0.3 mg/0.3 mL Soaj  injection Commonly known as: Auvi-Q  Inject 0.3 mg into the muscle as needed for anaphylaxis.   famotidine  40 MG tablet Commonly known as: PEPCID  Take 1 tablet (40 mg total) by mouth at bedtime.   fluticasone  50 MCG/ACT nasal spray Commonly known as: FLONASE  SHAKE LIQUID AND USE 2 SPRAYS IN EACH NOSTRIL EVERY MORNING   hydrocortisone  2.5 % cream Apply topically 2 (two) times daily as needed.   ketoconazole  2 % shampoo Commonly known as: NIZORAL  Apply 1 Application topically 2 (two) times a week.   levothyroxine  100 MCG tablet Commonly known as: SYNTHROID  Take 1 tablet (100 mcg total) by mouth daily.   nystatin  ointment Commonly known as: MYCOSTATIN  SMARTSIG:1 Topical Daily What changed: See the new instructions.   nystatin -triamcinolone  ointment Commonly known as: MYCOLOG Apply topically.   Olopatadine  HCl 0.2 % Soln Commonly known as: Pataday  Place 1 drop into both eyes every morning.   Repatha  SureClick 140 MG/ML Soaj Generic drug: Evolocumab  Inject 140 mg into the skin every 14 (fourteen) days.   rosuvastatin  10 MG tablet Commonly known as: CRESTOR  Take 1 tablet (10 mg total) by mouth daily.   Systane 0.4-0.3 % Soln Generic drug: Polyethyl Glycol-Propyl Glycol Apply 1 drop to eye every 4 (four) hours as needed.   traMADol  50 MG tablet Commonly known as: ULTRAM  Take 1 tablet (50 mg total) by mouth every 8 (eight) hours as needed for up to 7 days.   traZODone 50 MG tablet Commonly known as: DESYREL Take 100 mg by mouth at bedtime.   triamcinolone  ointment 0.5 % Commonly known as: KENALOG  Apply 1 Application topically 2 (two) times daily. Skin rash. What changed:  when to take this reasons to take this   valACYclovir  1000 MG tablet Commonly known as: VALTREX  Take 1 tablet (1,000 mg total) by mouth 3 (three) times daily for 7 days.    Past Medical History:  Diagnosis Date   Allergy     Anxiety    Complication of anesthesia     wakes up  crying    Depression    Eczema    GERD (gastroesophageal reflux disease)    Hyperlipidemia    Hypothyroidism    Interstitial cystitis    Ligament tear    Left elbow   Menorrhagia    Migraines    Neuromuscular disorder (HCC)    PONV (postoperative nausea and vomiting)    likes phenergan  and scopolamine  patch   Tennis elbow    Tennis elbow    Thyroid  disease    Trigger finger of right hand    Urticaria     Past Surgical History:  Procedure Laterality Date   carpal tunnel release Bilateral 2010   tennis elbow also done   CESAREAN SECTION     x 2   CHOLECYSTECTOMY  2003   DIAGNOSTIC LAPAROSCOPY  20002   thru belly button to check for endometriosis   DILITATION & CURRETTAGE/HYSTROSCOPY WITH NOVASURE ABLATION N/A 11/18/2019   Procedure: DILATATION & CURETTAGE/HYSTEROSCOPY WITH NOVASURE ABLATION;  Surgeon: Gorge Ade, MD;  Location: [facility];  Service: Gynecology;  Laterality: N/A;   ELBOW SURGERY     TRIGGER FINGER RELEASE Right    3rd and 4th digit    Review of systems negative except as noted in HPI / PMHx or noted below:  Review of Systems  Constitutional: Negative.   HENT: Negative.    Eyes: Negative.   Respiratory: Negative.    Cardiovascular: Negative.   Gastrointestinal: Negative.   Genitourinary: Negative.   Musculoskeletal: Negative.   Skin: Negative.   Neurological: Negative.   Endo/Heme/Allergies: Negative.   Psychiatric/Behavioral: Negative.       Objective:   Vitals:   01/09/24 1632 01/09/24 1712  BP: (!) 115/48 130/82  Pulse: 82   Temp: 98.3 F (36.8 C)   SpO2: 98%    Height: 5' 6.25 (168.3 cm)  Weight: 159 lb 4.8 oz (72.3 kg)   Physical Exam Constitutional:      Appearance: She is not diaphoretic.  HENT:     Head: Normocephalic.     Right Ear: Tympanic membrane, ear canal and external ear normal.     Left Ear: Tympanic membrane, ear canal and external ear normal.     Nose: Nose normal. No mucosal edema or rhinorrhea.      Mouth/Throat:     Pharynx: Uvula midline. No oropharyngeal exudate.  Eyes:     Conjunctiva/sclera: Conjunctivae normal.  Neck:     Thyroid : No thyromegaly.     Trachea: Trachea normal. No tracheal tenderness or tracheal deviation.  Cardiovascular:     Rate and Rhythm: Normal rate and regular rhythm.     Heart sounds: Normal heart sounds, S1 normal and S2 normal. No murmur heard. Pulmonary:     Effort: No respiratory distress.     Breath sounds: Normal breath sounds. No stridor. No wheezing or rales.  Lymphadenopathy:     Head:     Right side of head: No tonsillar adenopathy.     Left side of head: No tonsillar adenopathy.     Cervical: No cervical adenopathy.  Skin:    Findings: No erythema or rash.     Nails: There is no clubbing.  Neurological:     Mental Status: She is alert.     Diagnostics: Spirometry was performed and demonstrated an FEV1 of 3.01 at 107 % of predicted.   Assessment and Plan:   1. Asthma, well controlled, mild persistent   2. Perennial allergic rhinitis   3. LPRD (laryngopharyngeal reflux disease)   4. Idiopathic urticaria   5. Other postherpetic nervous system involvement     1.  Continue to perform Allergen avoidance measures as best as possible - dog / mold  2. Continue to Treat and prevent inflammation:   A.  Flonase  1-2 sprays each nostril daily  B.  Immunotherapy - dog / mold  3. Prevent immune activation:   A. Cetirizine  10 mg - 1 tablet 1-2 times per day  B. Famotidine  40 mg - 1 tablet 1 time per day  4.  If needed:   A.  Nasal saline  B.  Auvi-Q  0.3 / Epi-Pen, Benadryl, MD/ER evaluation for allergic reaction  C.  Systane eye drops multiple times per day  D.  OTC Pataday  - 1 drop each eye 1 time per day  E.  Airsupra  - 2 inhalations every 6 hours (replaces albuterol )  5.  Influenza = Tamiflu . Covid = Paxlovid  6. Return to clinic in 6 months or earlier if problem. Taper medications???  7. Recurrent shingles??? Other  dermatitis??? Daily Valtrex ???    [name] has pretty good control of her airway issue and her reflux issue on her current plan.  What is a significant problem recently as her papulovesicular cutaneous eruption that has occurred 3 times since July which does look as though it is zoster although certainly there could be some other trigger giving rise to this issue.  Assuming this is zoster then she needs to go on daily Valtrex  at least at 1000 mg a day as a preventative especially given the fact that she may have developed some right scapular nerve damage as a result of her last episode of zoster.  She will discuss this issue with her primary care doctor.  I will see her back in this clinic in 6 months or earlier if there is a problem.  Andrea Denis, MD Allergy  / Immunology [facility]

## 2024-01-10 ENCOUNTER — Encounter: Payer: Self-pay | Admitting: Allergy and Immunology

## 2024-01-10 MED ORDER — AIRSUPRA 90-80 MCG/ACT IN AERO: 2.0000 | INHALATION_SPRAY | RESPIRATORY_TRACT | 1 refills | Status: AC | PRN

## 2024-01-10 MED ORDER — FAMOTIDINE 40 MG PO TABS: 40.0000 mg | ORAL_TABLET | Freq: Every evening | ORAL | 1 refills | Status: AC

## 2024-01-10 MED ORDER — CETIRIZINE HCL 10 MG PO TABS: 10.0000 mg | ORAL_TABLET | Freq: Every day | ORAL | 1 refills | Status: AC | PRN

## 2024-01-10 MED ORDER — EPINEPHRINE 0.3 MG/0.3ML IJ SOAJ: 0.3000 mg | INTRAMUSCULAR | 2 refills | Status: AC | PRN

## 2024-01-10 MED ORDER — FLUTICASONE PROPIONATE 50 MCG/ACT NA SUSP: 1.0000 | Freq: Every day | NASAL | 1 refills | Status: AC

## 2024-01-15 MED ORDER — VALACYCLOVIR HCL 500 MG PO TABS: 500.0000 mg | ORAL_TABLET | Freq: Every day | ORAL | 1 refills | Status: AC

## 2024-01-16 ENCOUNTER — Ambulatory Visit: Admitting: Internal Medicine

## 2024-01-22 ENCOUNTER — Other Ambulatory Visit (HOSPITAL_COMMUNITY): Payer: Self-pay

## 2024-01-22 ENCOUNTER — Telehealth: Payer: Self-pay | Admitting: Pharmacy Technician

## 2024-01-22 NOTE — Telephone Encounter (Signed)
 Pharmacy Patient Advocate Encounter   Received notification from Onbase that prior authorization for repatha  is required/requested.   Insurance verification completed.   The patient is insured through [employer] [facility].   Per test claim: PA required; PA submitted to above mentioned insurance via Latent Key/confirmation #/EOC [id number] Status is pending

## 2024-01-22 NOTE — Telephone Encounter (Signed)
 Pharmacy Patient Advocate Encounter  Received notification from [employer] [facility] that Prior Authorization for repatha  has been APPROVED from 01/22/24 to 01/21/25   PA #/Case ID/Reference #: [id number]

## 2024-01-24 ENCOUNTER — Encounter (HOSPITAL_BASED_OUTPATIENT_CLINIC_OR_DEPARTMENT_OTHER): Payer: Self-pay | Admitting: Cardiology

## 2024-01-24 ENCOUNTER — Other Ambulatory Visit: Payer: Self-pay | Admitting: Cardiology

## 2024-01-24 ENCOUNTER — Ambulatory Visit (HOSPITAL_BASED_OUTPATIENT_CLINIC_OR_DEPARTMENT_OTHER): Admitting: Cardiology

## 2024-01-24 VITALS — BP 116/78 | HR 73 | Ht 66.5 in | Wt 161.0 lb

## 2024-01-24 DIAGNOSIS — E782 Mixed hyperlipidemia: Secondary | ICD-10-CM

## 2024-01-24 DIAGNOSIS — R931 Abnormal findings on diagnostic imaging of heart and coronary circulation: Secondary | ICD-10-CM

## 2024-01-24 DIAGNOSIS — E7800 Pure hypercholesterolemia, unspecified: Secondary | ICD-10-CM

## 2024-01-24 DIAGNOSIS — I2510 Atherosclerotic heart disease of native coronary artery without angina pectoris: Secondary | ICD-10-CM

## 2024-01-24 DIAGNOSIS — Z7189 Other specified counseling: Secondary | ICD-10-CM | POA: Diagnosis not present

## 2024-01-24 MED ORDER — REPATHA SURECLICK 140 MG/ML ~~LOC~~ SOAJ: 140.0000 mg | SUBCUTANEOUS | 3 refills | Status: AC

## 2024-01-24 MED ORDER — ROSUVASTATIN CALCIUM 10 MG PO TABS: 10.0000 mg | ORAL_TABLET | Freq: Every day | ORAL | 3 refills | Status: AC

## 2024-01-24 NOTE — Patient Instructions (Signed)
 Medication Instructions:  No changes *If you need a refill on your cardiac medications before your next appointment, please call your pharmacy*  Lab Work: Return for fasting blood work (lipids)  If you have labs (blood work) drawn today and your tests are completely normal, you will receive your results only by: MyChart Message (if you have MyChart) OR A paper copy in the mail If you have any lab test that is abnormal or we need to change your treatment, we will call you to review the results.  Testing/Procedures: none  Follow-Up: At [facility], you and your health needs are our priority.  As part of our continuing mission to provide you with exceptional heart care, our providers are all part of one team.  This team includes your primary Cardiologist (physician) and Advanced Practice Providers or APPs (Physician Assistants and Nurse Practitioners) who all work together to provide you with the care you need, when you need it.  Your next appointment:   12 month(s)  Provider:   Shelda Bruckner, MD, Rosaline Bane, NP, or Reche Finder, NP

## 2024-01-24 NOTE — Progress Notes (Signed)
 Cardiology Office Note:  .    Date:  01/24/2024  ID:  Rojelio Rudolph Amour, DOB [birth year], MRN [id number] PCP: Rollene Almarie LABOR, MD   [facility] Providers Cardiologist:  Shelda Bruckner, MD     History of Present Illness: .    [name] is a 53 y.o. female with a hx of hyperlipidemia, hypothyroidism, fibromyalgia, and GERD, who is seen for follow-up today. She was initially seen 09/2021 as a new consult at the request of Bensimhon, Toribio SAUNDERS, MD for the evaluation and management of elevated cholesterol.  Today: Watch was flagging her in the spring for fast heart rates. 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 No symptoms at the time. Now going to the gym. Has been working to keep her weight loss at the current level. Hip pain much improved. Having a lot of menopausal symptoms, including serious hot flashes. Didn't do well on the patch, now on estrace. Discussed unclear risk/benefit of estrogen with nonobstructive CAD (she does not have a history of prior events).    Has dealt with sequelae of shingles with some residual symptoms, has had three episodes since July this year.  ROS:  Denies chest pain, shortness of breath at rest or with normal exertion. No PND, orthopnea, LE edema or unexpected weight gain. No syncope or palpitations. ROS otherwise negative except as noted.   Studies Reviewed: SABRA    EKG Interpretation Date/Time:  Wednesday January 24 2024 16:24:54 EST Ventricular Rate:  72 PR Interval:  140 QRS Duration:  80 QT Interval:  408 QTC Calculation: 446 R Axis:   63  Text Interpretation: Normal sinus rhythm Septal infarct (cited on or before 24-Jan-2024) When compared with ECG of 26-Sep-2022 16:28, No significant change was found Confirmed by Bruckner Shelda [phone]) on 01/24/2024 4:46:05 PM     Physical Exam:    VS:  BP 116/78   Pulse 73   Ht 5' 6.5 (1.689 m)   Wt 161 lb (73 kg)   SpO2 96%   BMI 25.60 kg/m    Wt Readings from  Last 3 Encounters:  01/24/24 161 lb (73 kg)  01/09/24 159 lb 4.8 oz (72.3 kg)  01/08/24 162 lb 6.4 oz (73.7 kg)    GEN: Well nourished, well developed in no acute distress HEENT: Normal, moist mucous membranes NECK: No JVD CARDIAC: regular rhythm, normal S1 and S2, no rubs or gallops. No murmur. VASCULAR: Radial and DP pulses 2+ bilaterally. No carotid bruits RESPIRATORY:  Clear to auscultation without rales, wheezing or rhonchi  ABDOMEN: Soft, non-tender, non-distended MUSCULOSKELETAL:  Ambulates independently SKIN: Warm and dry, no edema NEUROLOGIC:  Alert and oriented x 3. No focal neuro deficits noted. PSYCHIATRIC:  Normal affect   ASSESSMENT AND PLAN: .    Coronary calcium , consistent with nonobstructive CAD. Mixed hyperlipidemia -CAC score 22.4 -she is tolerating rosuvastatin  10 mg daily, continue. Did not get to LDL goal on this alone initially, did not tolerate nexletol or zetia, tolerating repatha  -lipids at goal as of 09/2022, LDL 20 at that time. Due for lipids, ordered today -reviewed red flag warning signs that need immediate medical attention -she has not had MI or CVA. Discussed that most of the data recommending against HRT is in patients with CV events, not as much data on patients with nonobstructive disease. Some newer research suggests there could be benefit. Agree with her following with her GYN to monitor this.  Cardiac risk counseling and prevention recommendations: -recommend heart healthy/Mediterranean diet, with whole  grains, fruits, vegetable, fish, lean meats, nuts, and olive oil. Limit salt. -recommend moderate walking, 3-5 times/week for 30-50 minutes each session. Aim for at least 150 minutes.week. Goal should be pace of 3 miles/hours, or walking 1.5 miles in 30 minutes -recommend avoidance of tobacco products. Avoid excess alcohol.  Dispo: Follow-up in 1 year, or sooner as needed.  Signed, Shelda Bruckner, MD

## 2024-01-30 LAB — LIPID PANEL
Chol/HDL Ratio: 1.4 ratio (ref 0.0–4.4)
Cholesterol, Total: 176 mg/dL (ref 100–199)
HDL: 122 mg/dL
LDL Chol Calc (NIH): 41 mg/dL (ref 0–99)
Triglycerides: 70 mg/dL (ref 0–149)
VLDL Cholesterol Cal: 13 mg/dL (ref 5–40)

## 2024-02-26 ENCOUNTER — Ambulatory Visit (HOSPITAL_BASED_OUTPATIENT_CLINIC_OR_DEPARTMENT_OTHER): Payer: Self-pay | Admitting: Cardiology

## 2024-07-09 ENCOUNTER — Ambulatory Visit: Admitting: Allergy and Immunology
